# Patient Record
Sex: Female | Born: 1990
Health system: Southern US, Community
[De-identification: ages and names within clinical notes are randomized; demographics above are authoritative.]

## PROBLEM LIST (undated history)

## (undated) DIAGNOSIS — R112 Nausea with vomiting, unspecified: Secondary | ICD-10-CM

## (undated) DIAGNOSIS — R519 Headache, unspecified: Secondary | ICD-10-CM

## (undated) DIAGNOSIS — K219 Gastro-esophageal reflux disease without esophagitis: Secondary | ICD-10-CM

## (undated) DIAGNOSIS — R011 Cardiac murmur, unspecified: Secondary | ICD-10-CM

## (undated) DIAGNOSIS — E785 Hyperlipidemia, unspecified: Secondary | ICD-10-CM

## (undated) DIAGNOSIS — Z9889 Other specified postprocedural states: Secondary | ICD-10-CM

## (undated) HISTORY — DX: Gastro-esophageal reflux disease without esophagitis: K21.9

## (undated) HISTORY — PX: REDUCTION MAMMAPLASTY: SUR839

## (undated) HISTORY — DX: Cardiac murmur, unspecified: R01.1

---
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
## 2012-06-11 ENCOUNTER — Ambulatory Visit: Payer: Self-pay | Admitting: General Surgery

## 2012-06-11 LAB — COMPREHENSIVE METABOLIC PANEL
Albumin: 3.6 g/dL (ref 3.4–5.0)
Alkaline Phosphatase: 86 U/L (ref 50–136)
BUN: 9 mg/dL (ref 7–18)
Calcium, Total: 8.7 mg/dL (ref 8.5–10.1)
Co2: 23 mmol/L (ref 21–32)
EGFR (African American): >60
EGFR (Non-African Amer.): >60
Glucose: 84 mg/dL (ref 65–99)
SGPT (ALT): 27 U/L (ref 12–78)
Sodium: 139 mmol/L (ref 136–145)

## 2012-06-11 LAB — CBC WITH DIFFERENTIAL/PLATELET
Basophil #: 0 10*3/uL (ref 0.0–0.1)
Basophil %: 0.4 %
Eosinophil #: 0.2 10*3/uL (ref 0.0–0.7)
Eosinophil %: 2.1 %
HGB: 12.4 g/dL (ref 12.0–16.0)
Lymphocyte %: 21.3 %
MCH: 32 pg (ref 26.0–34.0)
MCHC: 34.5 g/dL (ref 32.0–36.0)
Monocyte #: 0.7 x10 3/mm (ref 0.2–0.9)
Neutrophil %: 69.9 %
Platelet: 299 10*3/uL (ref 150–440)
RBC: 3.87 10*6/uL (ref 3.80–5.20)
RDW: 12.3 % (ref 11.5–14.5)

## 2012-07-31 ENCOUNTER — Ambulatory Visit: Payer: Self-pay | Admitting: General Surgery

## 2012-12-01 ENCOUNTER — Emergency Department: Payer: Self-pay | Admitting: Emergency Medicine

## 2012-12-01 LAB — COMPREHENSIVE METABOLIC PANEL
Anion Gap: 4 — ABNORMAL LOW (ref 7–16)
BUN: 6 mg/dL — ABNORMAL LOW (ref 7–18)
Bilirubin,Total: 0.5 mg/dL (ref 0.2–1.0)
Chloride: 108 mmol/L — ABNORMAL HIGH (ref 98–107)
Creatinine: 0.71 mg/dL (ref 0.60–1.30)
EGFR (African American): >60
EGFR (Non-African Amer.): >60
Glucose: 98 mg/dL (ref 65–99)
SGOT(AST): 25 U/L (ref 15–37)
Sodium: 137 mmol/L (ref 136–145)

## 2012-12-01 LAB — URINALYSIS, COMPLETE
Glucose,UR: NEGATIVE mg/dL (ref 0–75)
Ketone: NEGATIVE
Ph: 5 (ref 4.5–8.0)
RBC,UR: 3 /HPF (ref 0–5)
Squamous Epithelial: 7
WBC UR: 2 /HPF (ref 0–5)

## 2012-12-01 LAB — CBC
HCT: 37.4 % (ref 35.0–47.0)
HGB: 13 g/dL (ref 12.0–16.0)
MCH: 33 pg (ref 26.0–34.0)
MCHC: 34.8 g/dL (ref 32.0–36.0)
MCV: 95 fL (ref 80–100)
Platelet: 276 10*3/uL (ref 150–440)
RBC: 3.94 10*6/uL (ref 3.80–5.20)
RDW: 12.7 % (ref 11.5–14.5)
WBC: 11.8 10*3/uL — ABNORMAL HIGH (ref 3.6–11.0)

## 2013-08-06 ENCOUNTER — Observation Stay: Payer: Self-pay

## 2013-08-06 LAB — PIH PROFILE
Anion Gap: 6 — ABNORMAL LOW (ref 7–16)
BUN: 5 mg/dL — ABNORMAL LOW (ref 7–18)
Creatinine: 0.46 mg/dL — ABNORMAL LOW (ref 0.60–1.30)
EGFR (African American): >60
EGFR (Non-African Amer.): >60
Glucose: 65 mg/dL (ref 65–99)
HCT: 29.6 % — ABNORMAL LOW (ref 35.0–47.0)
HGB: 10.6 g/dL — ABNORMAL LOW (ref 12.0–16.0)
MCH: 34.1 pg — ABNORMAL HIGH (ref 26.0–34.0)
MCV: 95 fL (ref 80–100)
Platelet: 179 10*3/uL (ref 150–440)
Potassium: 3.8 mmol/L (ref 3.5–5.1)
RBC: 3.1 10*6/uL — ABNORMAL LOW (ref 3.80–5.20)
Sodium: 137 mmol/L (ref 136–145)
Uric Acid: 2.4 mg/dL — ABNORMAL LOW (ref 2.6–6.0)

## 2013-08-06 LAB — PROTEIN / CREATININE RATIO, URINE
Creatinine, Urine: 115.4 mg/dL (ref 30.0–125.0)
Protein, Random Urine: 26 mg/dL — ABNORMAL HIGH (ref 0–12)
Protein/Creat. Ratio: 225 mg/gCREAT — ABNORMAL HIGH (ref 0–200)

## 2013-08-17 ENCOUNTER — Observation Stay: Payer: Self-pay

## 2013-08-18 ENCOUNTER — Inpatient Hospital Stay: Payer: Self-pay | Admitting: Obstetrics and Gynecology

## 2013-08-18 LAB — PIH PROFILE
Co2: 21 mmol/L (ref 21–32)
Creatinine: 0.63 mg/dL (ref 0.60–1.30)
Glucose: 94 mg/dL (ref 65–99)
MCH: 33.9 pg (ref 26.0–34.0)
MCV: 96 fL (ref 80–100)
Osmolality: 267 (ref 275–301)
Potassium: 3.7 mmol/L (ref 3.5–5.1)
RBC: 3.41 10*6/uL — ABNORMAL LOW (ref 3.80–5.20)
RDW: 13.2 % (ref 11.5–14.5)
Sodium: 135 mmol/L — ABNORMAL LOW (ref 136–145)
Uric Acid: 3 mg/dL (ref 2.6–6.0)

## 2013-08-18 LAB — PROTEIN / CREATININE RATIO, URINE: Creatinine, Urine: 78.4 mg/dL (ref 30.0–125.0)

## 2013-08-18 LAB — GC/CHLAMYDIA PROBE AMP

## 2013-08-20 LAB — HEMATOCRIT: HCT: 23.9 % — ABNORMAL LOW (ref 35.0–47.0)

## 2013-08-20 LAB — TSH: Thyroid Stimulating Horm: 4.58 u[IU]/mL — ABNORMAL HIGH

## 2013-08-21 LAB — CBC WITH DIFFERENTIAL/PLATELET
Basophil #: 0 10*3/uL (ref 0.0–0.1)
Eosinophil #: 0.2 10*3/uL (ref 0.0–0.7)
Eosinophil %: 2 %
Lymphocyte %: 13.5 %
MCH: 33.9 pg (ref 26.0–34.0)
MCHC: 35.3 g/dL (ref 32.0–36.0)
MCV: 96 fL (ref 80–100)
Monocyte %: 6.3 %
Platelet: 171 10*3/uL (ref 150–440)
RBC: 2.46 10*6/uL — ABNORMAL LOW (ref 3.80–5.20)
RDW: 13.2 % (ref 11.5–14.5)

## 2014-01-07 LAB — HM PAP SMEAR: HM Pap smear: NEGATIVE

## 2015-01-21 NOTE — Op Note (Signed)
PATIENT NAME:  Brooke Fox, LEA MR#:  [id number] DATE OF BIRTH:  [birth year]  DATE OF PROCEDURE:  08/19/2013  PREOPERATIVE DIAGNOSES:  1. A 38.2 week intrauterine pregnancy, undelivered.  2. Pregnancy-induced hypertension.  3. Arrest of dilation/descent.   POSTOPERATIVE DIAGNOSES:  1. A 38.2 week intrauterine pregnancy, undelivered.  2. Pregnancy-induced hypertension.  3. Arrest of dilation/descent.  4. Viable female infant 6 pounds 6 ounces.   OPERATIVE PROCEDURE: Primary low cervical transverse cesarean section.   SURGEON: Alanda Slim. Skyylar Kopf, M.D.   FIRST ASSISTANT: Gennie Alma, Certified Nurse Midwife.   SECOND ASSISTANT: Providence Lanius, PA-S.   ANESTHESIA: Spinal.   INDICATIONS: The patient is a 24 year old white female gravida 1, para 0, at 38.2 weeks' gestation who was admitted for induction of labor secondary to [facility]. She had oligohydramnios as well. The patient underwent Pitocin induction of labor and got to complete, complete, 0 station, ROA position. There was significant molding present and clinical pelvimetry demonstrated a marginal pelvis. Because of a nonreassuring fetal heart rate tracing, the patient was counseled to undergo cesarean section delivery.   FINDINGS AT SURGERY: Revealed a viable female infant 6 pounds 6 ounces, having Apgars of 9 and 9 at 1 and 5 minutes, respectively. Uterus, tubes and ovaries were grossly normal. There was significant molding of the fetal head noted.   DESCRIPTION OF PROCEDURE: The patient was brought to the operating room where she was placed in a sitting position. Spinal anesthetic was introduced without difficulty. She was placed in the supine position with a right lateral hip roll in place. A ChloraPrep abdominal and perineal prep and drape was performed in standard fashion. A Foley catheter had been placed which was draining clear yellow urine from the bladder. After checking for adequate level of anesthesia, a Pfannenstiel incision was made in  the abdomen. Fascia was incised transversely and extended bilaterally with Mayo scissors. The midline raphe was incised, separated and the peritoneum was entered. Bladder flap was created through sharp dissection. Low transverse incision was made in the uterus, and this was extended cephalad and caudad bluntly. The infant was delivered through a vertex presentation, and moderate molding was encountered. The infant was vigorous at birth. The umbilical cord was doubly clamped, cut and the infant was handed off to the awaiting resuscitation team. Cord blood sampling was obtained. Placenta was expressed from the uterine cavity. The uterus was externalized onto the anterior abdominal wall. The uterus was cleared of all debris with laps. The incision was closed in 2 layers using #1 chromic suture. The first layer was a running locked stitch. The second layer was an imbricating layer. The uterus, tubes and ovaries were grossly normal on inspection. Once satisfied with hemostasis, the uterus was placed back into the abdominopelvic cavity. Gutters were cleared of all debris with laps. The incision was closed in layers with 0 Maxon being used on the fascia in a simple running manner. The skin was closed with staples. Pressure dressing was applied. The patient was then mobilized and taken to the recovery room in satisfactory condition.   ESTIMATED BLOOD LOSS: 500 mL.   IV FLUIDS: 800 mL.   URINE OUTPUT: 150 mL.   All instruments, needle and sponge counts were verified as correct. The patient did receive Ancef antibiotic prophylaxis.    ____________________________ Alanda Slim Reshanda Lewey, MD mad:gb D: 08/21/2013 18:31:57 ET T: 08/21/2013 22:44:46 ET JOB#: [id number]  cc: Hassell Done A. Darrielle Pflieger, MD, <Dictator> [facility] Alanda Slim Romey Cohea MD ELECTRONICALLY SIGNED 09/03/2013 7:39

## 2015-02-08 NOTE — H&P (Signed)
L&D Evaluation:  History:  HPI 24yo MWF sent over from office for IOL at [redacted]w[redacted]d with mild PIH and oligohydramnios (AFI 4.1cm), G1P000, normal pregnancy until 36 weeks at which time she presented with sudden onset edema and elevated BP, has been closely follwed 2x/week until today.   Presents with other   Patient's Medical History Thyroid Disease   Patient's Surgical History none   Medications Pre Natal Vitamins  Iron  Tylenol (Acetaminophen)   Allergies NKDA   Social History none   Family History Non-Contributory   ROS:  ROS All systems were reviewed.  HEENT, CNS, GI, GU, Respiratory, CV, Renal and Musculoskeletal systems were found to be normal.   Exam:  Vital Signs stable   Urine Protein trace   General no apparent distress   Mental Status clear   Chest clear   Heart normal sinus rhythm   Abdomen gravid, non-tender   Estimated Fetal Weight Average for gestational age, EFW on u/s today 6#12oz   Fetal Position vtx   Back no CVAT   Edema 1+   Reflexes 1+   Clonus negative   Pelvic 2cm on last exam 08/16/13   Mebranes Intact   FHT normal rate with no decels   Ucx irregular   Skin dry   Impression:  Impression Pitocin IOL for oligoydramnios and mild PIH   Plan:  Plan monitor BP, antibiotics for GBBS prophylaxis, Pitocin per prtocol   Electronic Signatures: Trudee Kuster, Kristelle Cavallaro N (CNM)  (Signed 18-Nov-14 13:24)  Authored: L&D Evaluation   Last Updated: 18-Nov-14 13:24 by Evonnie Pat (CNM)

## 2015-02-08 NOTE — H&P (Signed)
L&D Evaluation:  History:  HPI 24yo MWF sent from office for Clarence workup and serial BP checks at [place], [place]. Presented today with sudden onset of facial , hand, and pedal edema, elevated BP, and c/o HA x2d with intermittent blurred vision.   Presents with other   Patient's Medical History No Chronic Illness   Patient's Surgical History none   Medications Pre Natal Vitamins  Tylenol (Acetaminophen)   Social History none   Family History Non-Contributory   ROS:  ROS All systems were reviewed.  HEENT, CNS, GI, GU, Respiratory, CV, Renal and Musculoskeletal systems were found to be normal.   Exam:  Vital Signs stable   Urine Protein trace   General no apparent distress   Mental Status clear   Chest clear   Heart normal sinus rhythm   Abdomen gravid, non-tender   Estimated Fetal Weight Average for gestational age   Fetal Position vtx   Back no CVAT   Edema 2+   Reflexes 1+   Clonus negative   Pelvic 1/50/-1   Mebranes Intact   FHT normal rate with no decels   Ucx absent   Impression:  Impression evaluation for PIH   Plan:  Plan discharge, OOW til return appointment on 08/11/13, modified bedrest, PIH precautions discussed.   Follow Up Appointment already scheduled   Electronic Signatures: Evonnie Pat (CNM)  (Signed [phone] 12:34)  Authored: L&D Evaluation   Last Updated: 06-Nov-14 12:34 by Evonnie Pat (CNM)

## 2015-02-11 ENCOUNTER — Other Ambulatory Visit: Payer: Self-pay | Admitting: Obstetrics and Gynecology

## 2015-02-22 ENCOUNTER — Other Ambulatory Visit: Payer: Self-pay | Admitting: Obstetrics and Gynecology

## 2015-07-27 ENCOUNTER — Other Ambulatory Visit: Payer: Self-pay

## 2015-07-27 ENCOUNTER — Other Ambulatory Visit: Payer: Self-pay | Admitting: Obstetrics and Gynecology

## 2015-07-30 LAB — VITAMIN D 25 HYDROXY (VIT D DEFICIENCY, FRACTURES): VIT D 25 HYDROXY: 49.5 ng/mL (ref 30.0–100.0)

## 2015-08-10 ENCOUNTER — Telehealth: Payer: Self-pay | Admitting: *Deleted

## 2015-08-10 NOTE — Telephone Encounter (Signed)
-----   Message from Evonnie Pat, [place] sent at 08/09/2015  2:51 AM EST ----- Please let her know her vit D is now normal, I would recommend weekly supplemnts through the winter.

## 2015-08-10 NOTE — Telephone Encounter (Signed)
Notified pt of lab results 

## 2015-10-19 ENCOUNTER — Other Ambulatory Visit: Payer: Self-pay | Admitting: Neurosurgery

## 2015-10-19 DIAGNOSIS — M545 Low back pain: Secondary | ICD-10-CM

## 2015-10-31 ENCOUNTER — Ambulatory Visit (HOSPITAL_COMMUNITY)
Admission: RE | Admit: 2015-10-31 | Discharge: 2015-10-31 | Disposition: A | Discharge status: Home / Self Care | Payer: PRIVATE HEALTH INSURANCE | Source: Ambulatory Visit | Attending: Neurosurgery | Admitting: Neurosurgery

## 2015-10-31 DIAGNOSIS — M5134 Other intervertebral disc degeneration, thoracic region: Secondary | ICD-10-CM | POA: Diagnosis not present

## 2015-10-31 DIAGNOSIS — M545 Low back pain: Secondary | ICD-10-CM | POA: Diagnosis not present

## 2015-11-07 ENCOUNTER — Ambulatory Visit: Payer: PRIVATE HEALTH INSURANCE | Attending: Neurosurgery | Admitting: Physical Therapy

## 2015-11-07 ENCOUNTER — Encounter: Payer: Self-pay | Admitting: Physical Therapy

## 2015-11-07 DIAGNOSIS — M5442 Lumbago with sciatica, left side: Secondary | ICD-10-CM | POA: Insufficient documentation

## 2015-11-07 DIAGNOSIS — M5441 Lumbago with sciatica, right side: Secondary | ICD-10-CM | POA: Diagnosis not present

## 2015-11-07 DIAGNOSIS — M62830 Muscle spasm of back: Secondary | ICD-10-CM | POA: Diagnosis present

## 2015-11-07 NOTE — Therapy (Signed)
[facility] [address] [place], [address] Phone: [phone]   Fax:  [phone]  Physical Therapy Evaluation  Patient Details  Name: Brooke Fox MRN: [id number] Date of Birth: 27-Apr-1991 Referring Provider: Jovita Gamma MD  Encounter Date: 11/07/2015      PT End of Session - 11/07/15 1040    Visit Number 1   Number of Visits 8   Date for PT Re-Evaluation 12/05/15   Authorization Type 1   Authorization Time Period 8 (WC)   PT Start Time 0935   PT Stop Time 1040   PT Time Calculation (min) 65 min   Activity Tolerance Patient tolerated treatment well   Behavior During Therapy [facility] for tasks assessed/performed      History reviewed. No pertinent past medical history.  Past Surgical History  Procedure Laterality Date  . Cesarean section      2014    There were no vitals filed for this visit.  Visit Diagnosis:  Bilateral low back pain with sciatica, sciatica laterality unspecified - Plan: PT plan of care cert/re-cert  Spasm of muscle, back - Plan: PT plan of care cert/re-cert      Subjective Assessment - 11/07/15 0955    Subjective Currently reporting sorness in low back bilaterally.     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 Pain is resolving at this time and continue with intermittent symptoms in LE's. Pt reports she has been treated conservatively and is now referred to physical therapy.     Limitations Sitting;Lifting;House hold activities;Standing;Other (comment)  Pushing and pulling   Diagnostic tests MRI lumbar spine   Patient Stated Goals patient would like to be able to bend, walk and work without back pain and return to prior level of function   Currently in Pain? Yes   Pain Score 2   pain ranges 2-8/10   Pain Location Back   Pain Orientation Lower   Pain Descriptors / Indicators Sore   Pain Type Acute pain  07/30/15   Pain Onset More than a month ago   Pain Frequency Constant   Aggravating Factors  bending, lifting, pushing, pulling   Pain Relieving Factors rest   Effect of Pain on Daily Activities limits abiltiy to perform household activties and work related activties without difficulty            [facility] PT Assessment - 11/07/15 0945    Assessment   Medical Diagnosis M54.20 Lumbago of lumbar region with sciatica   Referring Provider Jovita Gamma MD   Onset Date/Surgical Date 07/30/15   Hand Dominance Right   Next MD Visit Unknown   Prior Therapy None   Precautions   Precautions None   Restrictions   Weight Bearing Restrictions No   Balance Screen   Has the patient fallen in the past 6 months No   Has the patient had a decrease in activity level because of a fear of falling?  No   Is the patient reluctant to leave their home because of a fear of falling?  No  N   Home Ecologist residence   Home Access Stairs to enter   Entrance Stairs-Number of Steps 7   Entrance Stairs-Rails Can reach both   [place] One level   Prior Function   Level of Independence Independent   Vocation Full time employment   Runner, broadcasting/film/video: Wearing lead  apron (8lbs), lifting, pushing, pulling   Leisure Taking care of 25 year old, going to gym to exercise (cardio),   Cognition   Overall Cognitive Status Within Functional Limits for tasks assessed      Objective: AROM lumbar spine: flexion WNL's with lower back pain no worse with repetition, extension: limited 25% with increased lower back pain, no worse with repetition, lateral flexion bilaterally WFL without increased pain Strength: both LE's WFL's major muscle groups Palpation: + tenderness elicited lumbar spine lower levels  Special tests: Slump (+) reproduction of right LE symptoms (-) for left LE, SLR no reproduction of symptoms, crossed SLR (-) for reproduction of symptoms Posture: increased  lumbar lordosis Flexibility:  SLR bilaterally to 90 degrees   Sensation: grossly intact to light touch both LE's       PT Education - 11/07/15 1040    Education provided Yes   Education Details posture correction for sitting with lumbar roll, proper lifting technique using golfer's bend, prone over one pillow and prop on elbows for positioning to decrease pain   Person(s) Educated Patient   Methods Explanation;Demonstration;Verbal cues   Comprehension Verbalized understanding;Returned demonstration;Verbal cues required             PT Long Term Goals - 11/07/15 1040    PT LONG TERM GOAL #1   Title Patient will demonstrate improved function with daily tasks at home/work as indicated by modified Oswestry score of <10% by 12/05/2015   Baseline modified oswestry 18%   Status New   PT LONG TERM GOAL #2   Title Patient will report maximum pain level in back 3-4/10 with aggravating activties such as pulling/pushing by 12/05/2015    Baseline Pain level ranges from 2/10 up to 8/10   Status New   PT LONG TERM GOAL #3   Title Patient will be independent with home program for pain control and exercise in order to transition to self managment for return to prior level of function by 12/05/2015   Baseline limited knowledge of appropriate exercises and pain control strategies    Status New               Plan - 11/07/15 1040    Clinical Impression Statement Patient is a 25 year old right hand dominant female who presents with low back pain that is resolving since initial injury. She has limitations with work related activties involving lifting, pushing and pulling. She has limited ROM in lumbar spine with pain with extension. She has limited knowledge of appropriate pain control strategies and progression of exercises to improve function and return to prior level of function.    Pt will benefit from skilled therapeutic intervention in order to improve on the following deficits Decreased  strength;Pain;Increased muscle spasms;Decreased range of motion;Improper body mechanics;Impaired flexibility   Rehab Potential Good   Clinical Impairments Affecting Rehab Potential (+) age, motivated, acute condition   PT Frequency 2x / week   PT Duration 4 weeks   PT Treatment/Interventions Therapeutic exercise;Moist Heat;Electrical Stimulation;Cryotherapy;Ultrasound;Patient/family education;Manual techniques;Dry needling   PT Next Visit Plan pain control, posture instruciton, progressive exercise   PT Home Exercise Plan posture correction with sitting, positioning to decrease paiin, proper lifting technique (golfer's bend)   Consulted and Agree with Plan of Care Patient         Problem List There are no active problems to display for this patient.   Jomarie Longs PT 11/07/2015, 10:34 PM  [facility] [facility]  [address], [place], [address] Phone: [phone]   Fax:  [phone]  Name: Brooke Fox MRN: [id number] Date of Birth: 10-28-1990

## 2015-11-09 ENCOUNTER — Ambulatory Visit: Payer: PRIVATE HEALTH INSURANCE | Admitting: Physical Therapy

## 2015-11-09 DIAGNOSIS — M62830 Muscle spasm of back: Secondary | ICD-10-CM

## 2015-11-09 DIAGNOSIS — M5441 Lumbago with sciatica, right side: Secondary | ICD-10-CM

## 2015-11-09 DIAGNOSIS — M5442 Lumbago with sciatica, left side: Principal | ICD-10-CM

## 2015-11-10 NOTE — Therapy (Signed)
[place] [facility] [address], [place], [address] Phone: [phone]   Fax:  [phone]  Physical Therapy Treatment  Patient Details  Name: Brooke Fox MRN: [id number] Date of Birth: [birth year] Referring Provider: Jovita Gamma MD  Encounter Date: 11/09/2015      PT End of Session - 11/09/15 1030    Visit Number 2   Number of Visits 8   Date for PT Re-Evaluation 12/05/15   Authorization Type 2   Authorization Time Period 8 (WC)   PT Start Time 0950   PT Stop Time 1030   PT Time Calculation (min) 40 min   Activity Tolerance Patient tolerated treatment well   Behavior During Therapy [facility] for tasks assessed/performed      No past medical history on file.  Past Surgical History  Procedure Laterality Date  . Cesarean section      2014    There were no vitals filed for this visit.  Visit Diagnosis:  Bilateral low back pain with sciatica, sciatica laterality unspecified  Spasm of muscle, back      Subjective Assessment - 11/09/15 0951    Subjective Patient reports she is feeling better today with less back pain and no problems following previous session. She has not been working since the weekend.    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 Pt reports she has been treating conservatively.    Limitations Sitting;Lifting;House hold activities;Standing;Other (comment)  ;ushing/pulling   Patient Stated Goals patient would like to be able to bend, walk and work without back pain and return to prior level of function   Currently in Pain? Yes   Pain Score 2    Pain Location Back   Pain Orientation Right;Lower   Pain Descriptors / Indicators Sore   Pain Type Acute pain;Other (Comment)  07/30/2015   Pain Onset More than a month ago   Pain Frequency Constant       Objective: AROM; lumbar spine on arrival: flexion WNL's with  repetition mild back stiffness/soreness, extension: limited 25% with discomfort Palpation: + moderate sized spasms with tenderness elicited along [id number] right side          [facility] Adult PT Treatment/Exercise - 11/09/15 0952    Exercises   Exercises Other Exercises patient performed exercises with guidance, verbal and tactile cues and demonstration of PT: Prone lying upper back extension with arms at sides x 10, hip extension with elongating LE/spine for core control 2 x 5 reps each LE, side lying clam with tactile/verbal cues for correct positioning to engage correct muscles, supine bridging with ball between knees x 10 reps through partial ROM with controlled motion and TrA contraction     Manual therapy: superficial and deep techniques: STM with MFR techniques to right side paraspinal muscles/spasms to improve elasticity, decrease pain and improve ability to move with less difficulty; patient prone over one pillow   Patient response to treatment; increased warmth over area treated for STM with decreased spasms by >50% and decreaseds pain level to 0-1/10, patient able to perform exercises with good technique following instruction with verbal cuing           PT Education - 11/09/15 1020    Education provided Yes   Education Details HEP; supine bridge, prone upper back extension, hip extension, side lying clam   Person(s) Educated Patient   Methods Explanation;Demonstration;Verbal cues;Tactile cues;handout  given   Comprehension Verbalized understanding;Returned demonstration;Verbal cues required;Tactile cues required             PT Long Term Goals - 11/07/15 1040    PT LONG TERM GOAL #1   Title Patient will demonstrate improved function with daily tasks at home/work as indicated by modified Oswestry score of <10% by 12/05/2015   Baseline modified oswestry 18%   Status New   PT LONG TERM GOAL #2   Title Patient will report maximum pain level in back 3-4/10 with aggravating  activties such as pulling/pushing by 12/05/2015    Baseline Pain level ranges from 2/10 up to 8/10   Status New   PT LONG TERM GOAL #3   Title Patient will be independent with home program for pain control and exercise in order to transition to self managment for return to prior level of function by 12/05/2015   Baseline limited knowledge of appropriate exercises and pain control strategies    Status New               Plan - 11/09/15 1040    Clinical Impression Statement Patient demonstrated decreased pain to mild/none at end of session and was able to progress stabilizaiton/core control exercises with verbal and tactile cuing. She continues with pain/weakness and spasms in back that need to be addressed in order for her to return to prior level of function without limitaitons.    Pt will benefit from skilled therapeutic intervention in order to improve on the following deficits Decreased strength;Pain;Increased muscle spasms;Decreased range of motion;Improper body mechanics;Impaired flexibility   Rehab Potential Good   PT Frequency 2x / week   PT Duration 4 weeks   PT Treatment/Interventions Therapeutic exercise;Moist Heat;Electrical Stimulation;Cryotherapy;Ultrasound;Patient/family education;Manual techniques;Dry needling   PT Next Visit Plan pain control, posture instruciton, progressive exercise   PT Home Exercise Plan back stabilization, bridging, clam         Problem List There are no active problems to display for this patient.   Jomarie Longs PT 11/10/2015, 8:40 AM  [place] [facility] [address] [place], [address] Phone: [phone]   Fax:  [phone]  Name: Brooke Fox MRN: [id number] Date of Birth: 1991-03-11

## 2015-11-14 ENCOUNTER — Encounter: Payer: Self-pay | Admitting: Physical Therapy

## 2015-11-14 ENCOUNTER — Ambulatory Visit: Payer: PRIVATE HEALTH INSURANCE | Admitting: Physical Therapy

## 2015-11-14 DIAGNOSIS — M5442 Lumbago with sciatica, left side: Principal | ICD-10-CM

## 2015-11-14 DIAGNOSIS — M5441 Lumbago with sciatica, right side: Secondary | ICD-10-CM | POA: Diagnosis not present

## 2015-11-14 DIAGNOSIS — M62830 Muscle spasm of back: Secondary | ICD-10-CM

## 2015-11-14 NOTE — Therapy (Signed)
[place] [facility] [address], [place], [address] Phone: [phone]   Fax:  [phone]  Physical Therapy Treatment  Patient Details  Name: Brooke Fox MRN: [id number] Date of Birth: [birth year] Referring Provider: Jovita Gamma MD  Encounter Date: 11/14/2015      PT End of Session - 11/14/15 1202    Visit Number 3   Number of Visits 6   Date for PT Re-Evaluation 12/05/15   Authorization Type 3   Authorization Time Period 8 (WC)   PT Start Time 0945   PT Stop Time 1029   PT Time Calculation (min) 44 min   Activity Tolerance Patient tolerated treatment well   Behavior During Therapy [facility] for tasks assessed/performed      History reviewed. No pertinent past medical history.  Past Surgical History  Procedure Laterality Date  . Cesarean section      2014    There were no vitals filed for this visit.  Visit Diagnosis:  Bilateral low back pain with sciatica, sciatica laterality unspecified  Spasm of muscle, back      Subjective Assessment - 11/14/15 0945    Subjective Patient reports feeling better today after working versus the previous weekend.  Feels more on stiff on the left side today.    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 Pt reports she has been treating conservatively.    Limitations Sitting;Lifting;House hold activities;Standing;Other (comment)   Patient Stated Goals patient would like to be able to bend, walk and work without back pain and return to prior level of function   Currently in Pain? Yes   Pain Score 2    Pain Location Back   Pain Orientation Left   Pain Descriptors / Indicators Sore   Pain Type Acute pain   Pain Onset More than a month ago   Pain Frequency Constant      Objective: AROM; lumbar spine on arrival: flexion WNL with pain at end range, extension: limited 25% with discomfort Palpation:  + moderate sized spasms with tenderness elicited along [id number], L1-5 right side       [facility] Adult PT Treatment/Exercise - 11/14/15   Exercises   Exercises Other Exercises patient performed exercises with guidance, verbal and tactile cues and demonstration of PT: Prone lying upper back extension with arms at sides x 15, hip extension with elongating LE/spine for core control x10, x5 reps each LE-- manual resistance applied at top of leg height, knees to chest with yellow ball (45cm) x 2 min, side lying clam with tactile/verbal cues for correct positioning to engage correct muscles x 10 reps each, supine bridging with ball between knees 2 x 10 reps through partial ROM with controlled motion and TrA contraction, TrA contraction with bilateral arms overhead 2 x 10, Multifidus Crunch --  performed through partial ROM x 10            Manual therapy: superficial and deep techniques: STM to right side paraspinal muscles/spasms to improve elasticity, decreased pain and ability to move with less difficulty; patient prone over one pillow   Patient response to treatment: increased warmth over area treated for STM with decreased spasms. Patient required increase tactile and verbal cueing to perform multifidus crunch and unable to perform throughout full ROM without onset of low back symptoms (therefore modified exercise with short arc of motion) indicating decreased motor control during weight  bearing.          PT Education - 11/14/15 1201    Education provided Yes   Education Details HEP: Multifidus crunch   Person(s) Educated Patient   Methods Explanation;Demonstration;Verbal cues;Tactile cues   Comprehension Verbalized understanding;Returned demonstration;Verbal cues required;Tactile cues required             PT Long Term Goals - 11/07/15 1040    PT LONG TERM GOAL #1   Title Patient will demonstrate improved function with daily tasks at home/work as indicated by modified Oswestry  score of <10% by 12/05/2015   Baseline modified oswestry 18%   Status New   PT LONG TERM GOAL #2   Title Patient will report maximum pain level in back 3-4/10 with aggravating activties such as pulling/pushing by 12/05/2015    Baseline Pain level ranges from 2/10 up to 8/10   Status New   PT LONG TERM GOAL #3   Title Patient will be independent with home program for pain control and exercise in order to transition to self managment for return to prior level of function by 12/05/2015   Baseline limited knowledge of appropriate exercises and pain control strategies    Status New               Plan - 11/14/15 1203    Clinical Impression Statement Patient with improved lower back pain today indicating functional carryover between visits. Improved muscle activation when performing core stabilization exercise and continues to require minimal tactile and verbal cueing. Patient tolerated progression of exercise without increased symptoms  and will benefit from further skilled therapy aimed at improving motor control and decreasing muscle spasms to return to prior level of funciton.    Pt will benefit from skilled therapeutic intervention in order to improve on the following deficits Decreased strength;Pain;Increased muscle spasms;Decreased range of motion;Improper body mechanics;Impaired flexibility   Rehab Potential Good   Clinical Impairments Affecting Rehab Potential (+) age, motivated, acute condition   PT Frequency 2x / week   PT Duration 4 weeks   PT Treatment/Interventions Therapeutic exercise;Moist Heat;Electrical Stimulation;Cryotherapy;Ultrasound;Patient/family education;Manual techniques;Dry needling   PT Next Visit Plan pain control, posture instruciton, progressive exercise   PT Home Exercise Plan back stabilization, bridging, clam    Consulted and Agree with Plan of Care Patient        Problem List There are no active problems to display for this patient.   Blythe Stanford,  SPT 11/15/2015, 11:49 AM  [place] [facility] [address], [place], [address] Phone: [phone]   Fax:  [phone]  Name: Brooke Fox MRN: [id number] Date of Birth: September 03, 1991

## 2015-11-17 ENCOUNTER — Ambulatory Visit: Payer: PRIVATE HEALTH INSURANCE | Admitting: Physical Therapy

## 2015-11-17 ENCOUNTER — Encounter: Payer: Self-pay | Admitting: Physical Therapy

## 2015-11-17 DIAGNOSIS — M5441 Lumbago with sciatica, right side: Secondary | ICD-10-CM | POA: Diagnosis not present

## 2015-11-17 DIAGNOSIS — M5442 Lumbago with sciatica, left side: Principal | ICD-10-CM

## 2015-11-17 DIAGNOSIS — M62830 Muscle spasm of back: Secondary | ICD-10-CM

## 2015-11-17 NOTE — Therapy (Signed)
[place] [facility] [address] [place], [address] Phone: [phone]   Fax:  [phone]  Physical Therapy Treatment  Patient Details  Name: Brooke Fox MRN: [id number] Date of Birth: 01/22/1991 Referring Provider: Jovita Gamma MD  Encounter Date: 11/17/2015      PT End of Session - 11/17/15 1158    Visit Number 4   Number of Visits 8   Date for PT Re-Evaluation 12/05/15   Authorization Type 4   Authorization Time Period 8 (WC)   PT Start Time 1100   PT Stop Time 1148   PT Time Calculation (min) 48 min   Activity Tolerance Patient tolerated treatment well   Behavior During Therapy [facility] for tasks assessed/performed      History reviewed. No pertinent past medical history.  Past Surgical History  Procedure Laterality Date  . Cesarean section      2014    There were no vitals filed for this visit.  Visit Diagnosis:  Bilateral low back pain with sciatica, sciatica laterality unspecified  Spasm of muscle, back      Subjective Assessment - 11/17/15 1105    Subjective Patient reports feeling better than previous visit with no exacerbation of symptoms. Feels more stiff on the right versus left today.   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 Pt reports she has been treating conservatively.    Limitations Sitting;Lifting;House hold activities;Standing;Other (comment)   Patient Stated Goals patient would like to be able to bend, walk and work without back pain and return to prior level of function   Currently in Pain? Yes   Pain Score 1    Pain Location Back   Pain Orientation Right   Pain Descriptors / Indicators Sore   Pain Type Acute pain   Pain Onset More than a month ago   Pain Frequency Constant      Objective:  AROM; lumbar spine flexion: WNL's with mild soreness lower lumbar spine, extension WFL with mild soreness  end range  Palpation: decreased soft tissue mobility right lumbar paraspinal muscle with fascial restrictions noted    [facility] Adult PT Treatment/Exercise - 11/17/15   Exercises   Patient performed exercises with guidance, verbal and tactile cues and demonstration of PT: Prone press up -- x 3 Prone lying upper back extension with arms at sides --  x 15,  hip extension with elongating LE/spine for core control -- x15 reps each LE knees to chest with yellow ball (45cm) x 2 min, TrA contraction with bilateral arms overhead-- 2 x 15  Multifidus Crunch in sitting -- performed through partial ROM x 15 Side step-ups onto airex -- x15 bilaterally Rotational cable pulls -- x15 #10    Manual Therapy: Performed STM and myofascial techniques to left lumbar extensors with spasms noted of the L1-3 region with patient in the prone position with pillow under abdomen.     Patient Response to Treatment: Decreased muscle spasms by 50% and increased tolerance to passive accessory motions to L4-5 after STM to lumbar extensors.This increased tolerance indicates improved muscle guarding to the lumbar region. Patient demonstrated improved range of motion during multifidus crunch with improvement in coordination between anterior/posterior tilt of the pelvis.           PT Long Term Goals - 11/07/15 1040    PT LONG TERM GOAL #1   Title Patient will demonstrate  improved function with daily tasks at home/work as indicated by modified Oswestry score of <10% by 12/05/2015   Baseline modified oswestry 18%   Status New   PT LONG TERM GOAL #2   Title Patient will report maximum pain level in back 3-4/10 with aggravating activties such as pulling/pushing by 12/05/2015    Baseline Pain level ranges from 2/10 up to 8/10   Status New   PT LONG TERM GOAL #3   Title Patient will be independent with home program for pain control and exercise in order to transition to self managment for return to prior level of  function by 12/05/2015   Baseline limited knowledge of appropriate exercises and pain control strategies    Status New               Plan - 11/17/15 1159    Clinical Impression Statement Improvement in motor control and muscular endurance when performing core stabilization exercises today. Pt is progressing towards long term goals and required less verbal and tactile cueing to perform exercises in proper form indicating independence towards HEP. Pt will benefit from further skilled therapy aimed at increasing muscular endurance/strength and improving motor control to return to prior level of function.    Pt will benefit from skilled therapeutic intervention in order to improve on the following deficits Decreased strength;Pain;Increased muscle spasms;Decreased range of motion;Improper body mechanics;Impaired flexibility   Rehab Potential Good   Clinical Impairments Affecting Rehab Potential (+) age, motivated, acute condition   PT Frequency 2x / week   PT Duration 4 weeks   PT Treatment/Interventions Therapeutic exercise;Moist Heat;Electrical Stimulation;Cryotherapy;Ultrasound;Patient/family education;Manual techniques;Dry needling   PT Next Visit Plan pain control, posture instruciton, progressive exercise   PT Home Exercise Plan back stabilization, bridging, clam    Consulted and Agree with Plan of Care Patient        Problem List There are no active problems to display for this patient.   Blythe Stanford, SPT 11/17/2015, 12:12 PM  [place] [facility] [address], [place], [address] Phone: [phone]   Fax:  [phone]  Name: Brooke Fox MRN: [id number] Date of Birth: [birth year]

## 2015-11-21 ENCOUNTER — Encounter: Payer: Self-pay | Admitting: Physical Therapy

## 2015-11-21 ENCOUNTER — Ambulatory Visit: Payer: PRIVATE HEALTH INSURANCE | Admitting: Physical Therapy

## 2015-11-21 DIAGNOSIS — M5441 Lumbago with sciatica, right side: Secondary | ICD-10-CM

## 2015-11-21 DIAGNOSIS — M5442 Lumbago with sciatica, left side: Principal | ICD-10-CM

## 2015-11-21 DIAGNOSIS — M62830 Muscle spasm of back: Secondary | ICD-10-CM

## 2015-11-21 NOTE — Therapy (Signed)
[place] [facility] [address], [place], [address] Phone: [phone]   Fax:  [phone]  Physical Therapy Treatment  Patient Details  Name: Brooke Fox MRN: [id number] Date of Birth: 07-Dec-1990 Referring Provider: Jovita Gamma MD  Encounter Date: 11/21/2015      PT End of Session - 11/21/15 1054    Visit Number 5   Number of Visits 8   Date for PT Re-Evaluation 12/05/15   Authorization Type 5   Authorization Time Period 8 (WC)   PT Start Time [phone]   PT Stop Time 1033   PT Time Calculation (min) 46 min   Activity Tolerance Patient tolerated treatment well   Behavior During Therapy [facility] for tasks assessed/performed      History reviewed. No pertinent past medical history.  Past Surgical History  Procedure Laterality Date  . Cesarean section      2014    There were no vitals filed for this visit.  Visit Diagnosis:  Bilateral low back pain with sciatica, sciatica laterality unspecified  Spasm of muscle, back      Subjective Assessment - 11/21/15 1051    Subjective Patient reports increased low back pain secondary to increased activity at work over the weekend.    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 Pt reports she has been treating conservatively.    Limitations Sitting;Lifting;House hold activities;Standing;Other (comment)   Patient Stated Goals patient would like to be able to bend, walk and work without back pain and return to prior level of function   Currently in Pain? Yes   Pain Score 2    Pain Location Back   Pain Orientation Right   Pain Descriptors / Indicators Sore   Pain Type Acute pain   Pain Onset More than a month ago   Pain Frequency Constant        Objective:  AROM: lumbar spine flexion: WNL's with mild soreness lower lumbar spine, extension WFL with mild soreness end range  Palpation:  Decreased soft tissue mobility to right lumbar extensors/paraspinal muscles with fascial restrictions and spasms noted    [facility] Adult PT Treatment/Exercise - 11/21/15   Exercises   Patient performed exercises with guidance, verbal and tactile cues and demonstration of PT: Prone press up -- x 10 Prone lying upper back extension with arms at sides -- x 15,  hip extension with elongating LE/spine for core control -- x15 reps each LE knees to chest with yellow ball (45cm) x 2 min, Side to side straight leg lumbar rotation with yellow ball (45cm) x 2 min     Manual Therapy: Performed STM and myofascial techniques to bilateral lumbar extensors with spasms noted of the T10-L3 region with patient in the prone position with pillow under abdomen.  Modalities: Ice pack applied to lumbar region with patient in prone for 7 min. Skin assessed with no adverse reactions noted.     Patient Response to Treatment: Increased muscle spasms noted along left lumbar extensors which are decreased by 25% after STM. Decreased motor control with multifidus musculature during prone hip extension which is slightly improved after application of manual resistance. Decreased pain noted after performing pain-free lumbar rotation and flexion.        PT Education - 11/21/15 1054    Education provided Yes   Education Details HEP: Lower lumbar rotation   Person(s) Educated Patient  Methods Explanation;Demonstration   Comprehension Verbalized understanding;Returned demonstration             PT Long Term Goals - 11/07/15 1040    PT LONG TERM GOAL #1   Title Patient will demonstrate improved function with daily tasks at home/work as indicated by modified Oswestry score of <10% by 12/05/2015   Baseline modified oswestry 18%   Status New   PT LONG TERM GOAL #2   Title Patient will report maximum pain level in back 3-4/10 with aggravating activties such as pulling/pushing by 12/05/2015    Baseline Pain  level ranges from 2/10 up to 8/10   Status New   PT LONG TERM GOAL #3   Title Patient will be independent with home program for pain control and exercise in order to transition to self managment for return to prior level of function by 12/05/2015   Baseline limited knowledge of appropriate exercises and pain control strategies    Status New               Plan - 11/21/15 1057    Clinical Impression Statement Low back pain aggravated today secondary to increased workload over the weekend and required modification to treatment. Patient remains limitations in motor control when she performs multifidus activation exercises and will benefit from further skilled therapy aimed at increasing muscular endurance and improving motor control to return to prior level of function.     Pt will benefit from skilled therapeutic intervention in order to improve on the following deficits Decreased strength;Pain;Increased muscle spasms;Decreased range of motion;Improper body mechanics;Impaired flexibility   Rehab Potential Good   Clinical Impairments Affecting Rehab Potential (+) age, motivated, acute condition   PT Frequency 2x / week   PT Duration 4 weeks   PT Treatment/Interventions Therapeutic exercise;Moist Heat;Electrical Stimulation;Cryotherapy;Ultrasound;Patient/family education;Manual techniques;Dry needling   PT Next Visit Plan pain control, posture instruciton, progressive exercise   PT Home Exercise Plan back stabilization, bridging, clam, lumbar rotation   Consulted and Agree with Plan of Care Patient        Problem List There are no active problems to display for this patient.   Blythe Stanford, SPT 11/21/2015, 11:05 AM  [place] [facility] [address] [place], [address] Phone: [phone]   Fax:  [phone]  Name: Brooke Fox MRN: [id number] Date of Birth: 1991-02-02

## 2015-11-23 ENCOUNTER — Ambulatory Visit: Payer: PRIVATE HEALTH INSURANCE | Admitting: Physical Therapy

## 2015-11-23 ENCOUNTER — Encounter: Payer: Self-pay | Admitting: Physical Therapy

## 2015-11-23 DIAGNOSIS — M5441 Lumbago with sciatica, right side: Secondary | ICD-10-CM | POA: Diagnosis not present

## 2015-11-23 DIAGNOSIS — M5442 Lumbago with sciatica, left side: Principal | ICD-10-CM

## 2015-11-23 DIAGNOSIS — M62830 Muscle spasm of back: Secondary | ICD-10-CM

## 2015-11-23 NOTE — Therapy (Signed)
[place] [facility] [address], [place], [address] Phone: [phone]   Fax:  [phone]  Physical Therapy Treatment  Patient Details  Name: Brooke Fox MRN: [id number] Date of Birth: 1991-02-07 Referring Provider: Jovita Gamma MD  Encounter Date: 11/23/2015      PT End of Session - 11/23/15 1450    Visit Number 6   Number of Visits 8   Date for PT Re-Evaluation 12/05/15   Authorization Type 6   Authorization Time Period 8 (WC)   PT Start Time 0945   PT Stop Time 1030   PT Time Calculation (min) 45 min   Activity Tolerance Patient tolerated treatment well   Behavior During Therapy [facility] for tasks assessed/performed      History reviewed. No pertinent past medical history.  Past Surgical History  Procedure Laterality Date  . Cesarean section      2014    There were no vitals filed for this visit.  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 Pt reports she has been treating conservatively.    Limitations Sitting;Lifting;House hold activities;Standing;Other (comment)   Patient Stated Goals patient would like to be able to bend, walk and work without back pain and return to prior level of function   Currently in Pain? Yes   Pain Score 2    Pain Location Back   Pain Orientation Right   Pain Descriptors / Indicators Sore   Pain Type Acute pain   Pain Onset More than a month ago   Pain Frequency Constant   Aggravating Factors  bending, lifting, pushing, pulling   Pain Relieving Factors rest   Effect of Pain on Daily Activities limits ability to perform household  activities and work related activities without difficulty        Objective:  AROM: lumbar flexion: WNL's with mild soreness to lower lumbar spine at ER ; lumbar extension: WFL with mild soreness ER. Palpation: Decreased soft tissue mobility to right lumbar extensors/paraspinal muscles with fascial restrictions and spasms noted along T10-L5.    [facility] Adult PT Treatment/Exercise - 11/23/15   Exercises   Patient performed exercises with guidance, verbal and tactile cues and demonstration of PT: Prone on elbows -- 4 min Multifidus Crunch -- 2 x 15 Low Row in standing with grey band -- 2 x 15 Tall kneeling squat -- 2 x 15 Seated Lumbar extension against grey band -- 2 x 15    Manual Therapy: Performed STM and myofascial techniques to right lumbar extensors and paraspinal musculature. Spasms noted along paraspinal muscles of the T10-L3 region with patient in the prone position.  Modalities: Ice massage: 5 min to patient in prone along lumbar extensors and lower paraspinal musculature. Decrease in pain noted after treatment. No adverse effects noted after treatment.    Patient Response to Treatment: Decreased spasms by 33% after performing STM and myofascial techniques to lumbar extensors and paraspinal musculature. Increase in lumbar extension and decrease in pain after performing manual therapy and therapeutic exercise.         PT Long Term Goals - 11/07/15 1040  PT LONG TERM GOAL #1   Title Patient will demonstrate improved function with daily tasks at home/work as indicated by modified Oswestry score of <10% by 12/05/2015   Baseline modified oswestry 18%   Status New   PT LONG TERM GOAL #2   Title Patient will report maximum pain level in back 3-4/10 with aggravating activties such as pulling/pushing by 12/05/2015    Baseline Pain level ranges from 2/10 up to 8/10   Status New   PT LONG TERM GOAL #3   Title Patient will be independent with home program for  pain control and exercise in order to transition to self managment for return to prior level of function by 12/05/2015   Baseline limited knowledge of appropriate exercises and pain control strategies    Status New               Plan - 11/23/15 1505    Clinical Impression Statement Patient is progressing towards long term goals with ability to perform advancement of exercise without onset of symptoms. Focused on core stabilization today to increase muscle activation of the TrA and multifidus to better perform occupational tasks such as lifting. Patient will benefit from further skilled therapy aimed at increasing muscular endurance and motor control of the anterior and posterior core stabilizers to better return to prior level of function.    Pt will benefit from skilled therapeutic intervention in order to improve on the following deficits Decreased strength;Pain;Increased muscle spasms;Decreased range of motion;Improper body mechanics;Impaired flexibility   Rehab Potential Good   Clinical Impairments Affecting Rehab Potential (+) age, motivated, acute condition   PT Frequency 2x / week   PT Duration 4 weeks   PT Treatment/Interventions Therapeutic exercise;Moist Heat;Electrical Stimulation;Cryotherapy;Ultrasound;Patient/family education;Manual techniques;Dry needling   PT Next Visit Plan pain control, posture instruciton, progressive exercise   PT Home Exercise Plan back stabilization, bridging, clam, lumbar rotation   Consulted and Agree with Plan of Care Patient        Problem List There are no active problems to display for this patient.   Blythe Stanford, SPT 11/23/2015, 3:21 PM  [place] [facility] [address] [place], [address] Phone: [phone]   Fax:  [phone]  Name: Brooke Fox MRN: [id number] Date of Birth: May 24, 1991

## 2015-11-28 ENCOUNTER — Ambulatory Visit: Payer: PRIVATE HEALTH INSURANCE | Admitting: Physical Therapy

## 2015-11-28 ENCOUNTER — Encounter: Payer: Self-pay | Admitting: Physical Therapy

## 2015-11-28 DIAGNOSIS — M5442 Lumbago with sciatica, left side: Principal | ICD-10-CM

## 2015-11-28 DIAGNOSIS — M5441 Lumbago with sciatica, right side: Secondary | ICD-10-CM | POA: Diagnosis not present

## 2015-11-28 DIAGNOSIS — M62830 Muscle spasm of back: Secondary | ICD-10-CM

## 2015-11-28 NOTE — Therapy (Signed)
[facility] [address], [place], [address] Phone: [phone]   Fax:  [phone]  Physical Therapy Treatment  Patient Details  Name: Brooke Fox MRN: [id number] Date of Birth: 1990-12-09 Referring Provider: Jovita Gamma MD  Encounter Date: 11/28/2015      PT End of Session - 11/28/15 1401    Visit Number 7   Number of Visits 8   Date for PT Re-Evaluation 12/05/15   Authorization Type 7   Authorization Time Period 8 (WC)   PT Start Time 1142   PT Stop Time 1222   PT Time Calculation (min) 40 min   Activity Tolerance Patient tolerated treatment well   Behavior During Therapy [facility] for tasks assessed/performed      History reviewed. No pertinent past medical history.  Past Surgical History  Procedure Laterality Date  . Cesarean section      2014    There were no vitals filed for this visit.  Visit Diagnosis:  Bilateral low back pain with sciatica, sciatica laterality unspecified  Spasm of muscle, back     Subjective Assessment - 11/28/15   Subjective Patient reports no back pain upon entering the clinic and states her weekend shift was light and not as busy.    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 Pt reports she has been treating conservatively.    Limitations Sitting;Lifting;House hold activities;Standing;Other (comment)   Patient Stated Goals patient would like to be able to bend, walk and work without back pain and return to prior level of function   Currently in Pain? No   Effect of Pain on Daily Activities limits ability to perform household activities and work related activities without difficulty       Objective:  AROM: lumbar flexion: WNL; lumbar extension: WFL with mild soreness ER.  [facility] Adult PT Treatment/Exercise - 11/28/15   Exercises   Patient performed  exercises with guidance, verbal and tactile cues and demonstration of PT: Ramp walking with 6# dumbbell alternating sides -- 42ft up/down x 6 Hip machine -- #55 ABD, EXT/ADD #70 -- x15 each LE Standing Lumbar flexion/ext at [place]-- x 15 #10 Ball toss against rebounder single leg stance with contralateral LE supported on compliant ball -- x25 each side Resisted walking with grey tubing -- x 10 side-stepping, walking forward/backwards     Patient Response to Treatment: Increased lumbar symptoms after performing resisted lumbar flexion/ext in standing which resolved after performing ramp walking. Increased muscle soreness after performing exercises on the affected side versus the non-affected side. Minimal cuing required to perform exercises with good technique and alignment          PT Education - 11/28/15 1400    Education provided Yes   Education Details Resisted walking forward/backward and side stepping with gray tube.   Person(s) Educated Patient   Methods Explanation;Demonstration   Comprehension Verbalized understanding;Returned demonstration             PT Long Term Goals - 11/07/15 1040    PT LONG TERM GOAL #1   Title Patient will demonstrate improved function with daily tasks at home/work as indicated by modified Oswestry score of <10% by 12/05/2015   Baseline modified oswestry 18%   Status New   PT LONG TERM GOAL #2   Title Patient will report maximum pain level in back 3-4/10 with aggravating activties such as pulling/pushing by  12/05/2015    Baseline Pain level ranges from 2/10 up to 8/10   Status New   PT LONG TERM GOAL #3   Title Patient will be independent with home program for pain control and exercise in order to transition to self managment for return to prior level of function by 12/05/2015   Baseline limited knowledge of appropriate exercises and pain control strategies    Status New               Plan - 11/28/15 1402    Clinical Impression  Statement Patient is progressing well towards long term goals with decreased pain upon entering the clinic today indicating functional carryover between visits. Pt tolerated increase in exercise progression without increase in symptoms but still lacks appropiate muscular endurance to acheive functional goals. Pt will benefit from further skilled therapy to address endurance and improve motor control to return to prior level of function.     Pt will benefit from skilled therapeutic intervention in order to improve on the following deficits Decreased strength;Pain;Increased muscle spasms;Decreased range of motion;Improper body mechanics;Impaired flexibility   Rehab Potential Good   Clinical Impairments Affecting Rehab Potential (+) age, motivated, acute condition   PT Frequency 2x / week   PT Duration 4 weeks   PT Treatment/Interventions Therapeutic exercise;Moist Heat;Electrical Stimulation;Cryotherapy;Ultrasound;Patient/family education;Manual techniques;Dry needling   PT Next Visit Plan pain control, posture instruciton, progressive exercise   PT Home Exercise Plan back stabilization, bridging, clam, lumbar rotation   Consulted and Agree with Plan of Care Patient        Problem List There are no active problems to display for this patient.   Blythe Stanford, SPT 11/28/2015, 2:11 PM  [facility] [address], [place], [address] Phone: [phone]   Fax:  [phone]  Name: Brooke Fox MRN: [id number] Date of Birth: 1991-06-29

## 2015-11-30 ENCOUNTER — Encounter: Payer: Self-pay | Admitting: Physical Therapy

## 2015-11-30 ENCOUNTER — Ambulatory Visit: Payer: PRIVATE HEALTH INSURANCE | Attending: Neurosurgery | Admitting: Physical Therapy

## 2015-11-30 DIAGNOSIS — M5442 Lumbago with sciatica, left side: Secondary | ICD-10-CM | POA: Diagnosis present

## 2015-11-30 DIAGNOSIS — M5441 Lumbago with sciatica, right side: Secondary | ICD-10-CM | POA: Insufficient documentation

## 2015-11-30 DIAGNOSIS — M62830 Muscle spasm of back: Secondary | ICD-10-CM | POA: Insufficient documentation

## 2015-11-30 NOTE — Therapy (Signed)
[facility] [address] [place], [address] Phone: [phone]   Fax:  [phone]  Physical Therapy Treatment/Discharge Summary  Patient Details  Name: Brooke Fox MRN: [id number] Date of Birth: [birth year] Referring Provider: Jovita Gamma MD  Encounter Date: 11/30/2015  Patient began physical therapy 11/07/2015 and attended 8 sessions throught 11/30/2015 with goals achieved and patient independent with home program for self management of symptoms and exercises. Plan discharge from physical therapy       PT End of Session - 11/30/15 1215    Visit Number 8   Number of Visits 8   Date for PT Re-Evaluation 12/05/15   Authorization Type 8   Authorization Time Period 8 (WC)   PT Start Time 1115   PT Stop Time 1147   PT Time Calculation (min) 32 min   Activity Tolerance Patient tolerated treatment well   Behavior During Therapy [facility] for tasks assessed/performed      History reviewed. No pertinent past medical history.  Past Surgical History  Procedure Laterality Date  . Cesarean section      2014    There were no vitals filed for this visit.  Visit Diagnosis:  Bilateral low back pain with sciatica, sciatica laterality unspecified  Spasm of muscle, back      Subjective Assessment - 11/30/15 1120    Subjective Patient reports no back pain at rest and and experiencing slight pain at end range of lumbar extension. Patient states she feels prepared to be discharged as she is able to perform ADLs without pain and can continue with home program of exercises as instructed.    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 Pt reports she has been treating conservatively.    Limitations Sitting;Lifting;House hold activities;Standing;Other (comment)   Patient Stated Goals patient would like to be able to bend, walk and work without back pain  and return to prior level of function   Currently in Pain? No/denies   Pain Score 0-No pain       Objective: AROM lumbar spine: flexion WNL's with lower back pain no worse with repetition, extension: WNL --slight pain at end range, Strength: both LE's WFL's major muscle groups Palpation: No discomfort noted Special tests: Slump (-), SLR (-)  Posture: increased lumbar lordosis Flexibility: SLR bilaterally to 90 degrees  Sensation: grossly intact to light touch both LE's Muscular Activation: Tra and multifidus: strong with appropriate control    [facility] Adult PT Treatment/Exercise - 11/30/15   Exercises   Patient performed exercises with guidance, verbal and tactile cues and demonstration of PT:  Hip rotatry machine -- #70 ABD, EXT/ADD #100 -- x15 each LE Diagonal "wedding" Marches -- #10 bilaterally 70mn on ramp Ball  against rebounder single leg stance with contralateral LE supported on compliant ball -- x25 each side Resisted walking with grey tubing -- x 10 side-stepping, walking forward/backwards Prone hip ext -- x5 bilaterally    Objective Measures:  Modified Oswestry: 12% Patient Response to Treatment: No increase in pain after or during the performance of exercise. Required minimal cueing when preparing hip machine for exercise. Required no significant cueing to perform all other exercises. Patient verbalized good understanding of home program and self management of symptoms         PT Long Term Goals - 11/30/15 1218    PT LONG TERM GOAL #1   Title Patient  will demonstrate improved function with daily tasks at home/work as indicated by modified Oswestry score of <10% by 12/05/2015   Baseline modified oswestry 18% (11/30/15 12%)   Status Achieved   PT LONG TERM GOAL #2   Title Patient will report maximum pain level in back 3-4/10 with aggravating activties such as pulling/pushing by 12/05/2015    Baseline Pain level ranges from 2/10 up to 8/10 (11/30/2015  0/10 up to 3/10)   Status Achieved   PT LONG TERM GOAL #3   Title Patient will be independent with home program for pain control and exercise in order to transition to self managment for return to prior level of function by 12/05/2015   Baseline limited knowledge of appropriate exercises and pain control strategies (11/30/2015 independent with exercise performance)   Status Achieved               Plan - 11/30/15 1231    Clinical Impression Statement Patient has met long term goals in terms of independence with exercise, modified oswestry, and max. pain indicating functional improvement and return to prior level of function. Patient is able to manage exercises without cueing throughout session today and is ready for discharge from therapy.   Pt will benefit from skilled therapeutic intervention in order to improve on the following deficits Decreased strength;Pain;Increased muscle spasms;Decreased range of motion;Improper body mechanics;Impaired flexibility   Rehab Potential Good   Clinical Impairments Affecting Rehab Potential (+) age, motivated, acute condition   PT Frequency 2x / week   PT Duration 4 weeks   PT Treatment/Interventions Therapeutic exercise;Moist Heat;Electrical Stimulation;Cryotherapy;Ultrasound;Patient/family education;Manual techniques;Dry needling   PT Next Visit Plan Discharge from physical therapy   PT Home Exercise Plan back stabilization, bridging, clam, lumbar rotation   Consulted and Agree with Plan of Care Patient        Problem List There are no active problems to display for this patient.   Blythe Stanford, SPT 11/30/2015, 12:35 PM  [facility] [address] [place], [address] Phone: [phone]   Fax:  [phone]  Name: Brooke Fox MRN: [id number] Date of Birth: 1990/12/25

## 2016-01-16 ENCOUNTER — Encounter: Payer: Self-pay | Admitting: *Deleted

## 2016-01-19 ENCOUNTER — Encounter: Payer: Self-pay | Admitting: Obstetrics and Gynecology

## 2016-02-13 ENCOUNTER — Other Ambulatory Visit: Payer: Self-pay | Admitting: *Deleted

## 2016-02-13 ENCOUNTER — Telehealth: Payer: Self-pay | Admitting: Obstetrics and Gynecology

## 2016-02-13 MED ORDER — NORGESTIMATE-ETH ESTRADIOL 0.25-35 MG-MCG PO TABS: 1.0000 | ORAL_TABLET | Freq: Every day | ORAL | Status: DC

## 2016-02-13 NOTE — Telephone Encounter (Signed)
i moved [employer] out to July. She said she would need a refill on her [facility] because it will expire before her AE appt in July. Thank you

## 2016-02-13 NOTE — Telephone Encounter (Signed)
Done-ac 

## 2016-02-17 ENCOUNTER — Encounter: Payer: Self-pay | Admitting: Obstetrics and Gynecology

## 2016-02-20 ENCOUNTER — Other Ambulatory Visit: Payer: Self-pay | Admitting: Obstetrics and Gynecology

## 2016-04-05 ENCOUNTER — Encounter: Payer: Self-pay | Admitting: Obstetrics and Gynecology

## 2016-04-05 ENCOUNTER — Ambulatory Visit (INDEPENDENT_AMBULATORY_CARE_PROVIDER_SITE_OTHER): Payer: 59 | Admitting: Obstetrics and Gynecology

## 2016-04-05 ENCOUNTER — Other Ambulatory Visit: Payer: Self-pay | Admitting: Obstetrics and Gynecology

## 2016-04-05 VITALS — BP 141/83 | HR 74 | Ht 59.5 in | Wt 160.3 lb

## 2016-04-05 DIAGNOSIS — Z01419 Encounter for gynecological examination (general) (routine) without abnormal findings: Secondary | ICD-10-CM | POA: Diagnosis not present

## 2016-04-05 DIAGNOSIS — E559 Vitamin D deficiency, unspecified: Secondary | ICD-10-CM | POA: Diagnosis not present

## 2016-04-05 DIAGNOSIS — E669 Obesity, unspecified: Secondary | ICD-10-CM

## 2016-04-05 MED ORDER — NORGESTIMATE-ETH ESTRADIOL 0.25-35 MG-MCG PO TABS
ORAL_TABLET | ORAL | Status: DC
Start: 2016-04-05 — End: 2018-05-30

## 2016-04-05 NOTE — Progress Notes (Signed)
  Subjective:     Brooke Fox is a 25 y.o. female and is here for a comprehensive physical exam. The patient reports fatigue and weight gain since back injury.  Social History   Social History  . Marital Status: Married    Spouse Name: N/A  . Number of Children: N/A  . Years of Education: N/A   Occupational History  . Not on file.   Social History Main Topics  . Smoking status: Never Smoker   . Smokeless tobacco: Never Used  . Alcohol Use: No  . Drug Use: No  . Sexual Activity: Yes    Birth Control/ Protection: Pill   Other Topics Concern  . Not on file   Social History Narrative   Health Maintenance  Topic Date Due  . HIV Screening  11/10/2003  . TETANUS/TDAP  11/10/2007  . INFLUENZA VACCINE  05/01/2016  . PAP SMEAR  01/07/2017    The following portions of the patient's history were reviewed and updated as appropriate: allergies, current medications, past family history, past medical history, past social history, past surgical history and problem list.  Review of Systems Pertinent items noted in HPI and remainder of comprehensive ROS otherwise negative.   Objective:    General appearance: alert, cooperative, appears stated age and moderately obese Neck: no adenopathy, no carotid bruit, no JVD, supple, symmetrical, trachea midline and thyroid not enlarged, symmetric, no tenderness/mass/nodules Lungs: clear to auscultation bilaterally Breasts: normal appearance, no masses or tenderness Heart: regular rate and rhythm, S1, S2 normal, no murmur, click, rub or gallop Abdomen: soft, non-tender; bowel sounds normal; no masses,  no organomegaly Pelvic: cervix normal in appearance, external genitalia normal, no adnexal masses or tenderness, no cervical motion tenderness, rectovaginal septum normal, uterus normal size, shape, and consistency and vagina normal without discharge    Assessment:    Healthy female exam. Obesity, vitamin D deficiency      Plan:  Labs  obtained, desires start of weight loss plan- will schedule f/u appt.   See After Visit Summary for Counseling Recommendations

## 2016-04-05 NOTE — Patient Instructions (Signed)
  Place annual gynecologic exam patient instructions here.  Thank you for enrolling in [facility]. Please follow the instructions below to securely access your online medical record. MyChart allows you to send messages to your doctor, view your test results, manage appointments, and more.   How Do I Sign Up? 1. In your Internet browser, go to [employer] and enter [email or web address]. 2. Click on the Sign Up Now link in the Sign In box. You will see the New Member Sign Up page. 3. Enter your MyChart Access Code exactly as it appears below. You will not need to use this code after you've completed the sign-up process. If you do not sign up before the expiration date, you must request a new code.  MyChart Access Code: [email or web address] Expires: 05/09/2016  8:26 AM  4. Enter your Social Security Number ([id number]) and Date of Birth (mm/dd/yyyy) as indicated and click Submit. You will be taken to the next sign-up page. 5. Create a MyChart ID. This will be your MyChart login ID and cannot be changed, so think of one that is secure and easy to remember. 6. Create a MyChart password. You can change your password at any time. 7. Enter your Password Reset Question and Answer. This can be used at a later time if you forget your password.  8. Enter your e-mail address. You will receive e-mail notification when new information is available in [facility]. 9. Click Sign Up. You can now view your medical record.   Additional Information Remember, MyChart is NOT to be used for urgent needs. For medical emergencies, dial 911.

## 2016-04-06 ENCOUNTER — Other Ambulatory Visit: Payer: Self-pay | Admitting: Obstetrics and Gynecology

## 2016-04-06 ENCOUNTER — Ambulatory Visit (INDEPENDENT_AMBULATORY_CARE_PROVIDER_SITE_OTHER): Payer: 59 | Admitting: Obstetrics and Gynecology

## 2016-04-06 DIAGNOSIS — E669 Obesity, unspecified: Secondary | ICD-10-CM | POA: Diagnosis not present

## 2016-04-06 DIAGNOSIS — E559 Vitamin D deficiency, unspecified: Secondary | ICD-10-CM

## 2016-04-06 DIAGNOSIS — R7989 Other specified abnormal findings of blood chemistry: Secondary | ICD-10-CM

## 2016-04-06 DIAGNOSIS — E785 Hyperlipidemia, unspecified: Secondary | ICD-10-CM

## 2016-04-06 DIAGNOSIS — E538 Deficiency of other specified B group vitamins: Secondary | ICD-10-CM

## 2016-04-06 DIAGNOSIS — R946 Abnormal results of thyroid function studies: Secondary | ICD-10-CM

## 2016-04-06 LAB — COMPREHENSIVE METABOLIC PANEL
A/G RATIO: 1.6 (ref 1.2–2.2)
ALT: 14 IU/L (ref 0–32)
AST: 14 IU/L (ref 0–40)
Albumin: 4.2 g/dL (ref 3.5–5.5)
Alkaline Phosphatase: 65 IU/L (ref 39–117)
BUN/Creatinine Ratio: 12 (ref 9–23)
BUN: 7 mg/dL (ref 6–20)
CALCIUM: 9.3 mg/dL (ref 8.7–10.2)
CHLORIDE: 102 mmol/L (ref 96–106)
CO2: 19 mmol/L (ref 18–29)
Creatinine, Ser: 0.6 mg/dL (ref 0.57–1.00)
GFR, EST AFRICAN AMERICAN: 144 mL/min/{1.73_m2} (ref >59)
GFR, EST NON AFRICAN AMERICAN: 125 mL/min/{1.73_m2} (ref >59)
GLOBULIN, TOTAL: 2.7 g/dL (ref 1.5–4.5)
Glucose: 85 mg/dL (ref 65–99)
POTASSIUM: 4.4 mmol/L (ref 3.5–5.2)
SODIUM: 137 mmol/L (ref 134–144)
Total Protein: 6.9 g/dL (ref 6.0–8.5)

## 2016-04-06 LAB — CBC
Hematocrit: 36.7 % (ref 34.0–46.6)
Hemoglobin: 12.2 g/dL (ref 11.1–15.9)
MCH: 32.5 pg (ref 26.6–33.0)
MCHC: 33.2 g/dL (ref 31.5–35.7)
MCV: 98 fL — AB (ref 79–97)
PLATELETS: 294 10*3/uL (ref 150–379)
RBC: 3.75 x10E6/uL — AB (ref 3.77–5.28)
RDW: 12.5 % (ref 12.3–15.4)
WBC: 8.3 10*3/uL (ref 3.4–10.8)

## 2016-04-06 LAB — HEMOGLOBIN A1C
ESTIMATED AVERAGE GLUCOSE: 80 mg/dL
Hgb A1c MFr Bld: 4.4 % — ABNORMAL LOW (ref 4.8–5.6)

## 2016-04-06 LAB — LIPID PANEL
Chol/HDL Ratio: 3.4 ratio units (ref 0.0–4.4)
Cholesterol, Total: 228 mg/dL — ABNORMAL HIGH (ref 100–199)
HDL: 67 mg/dL (ref >39)
LDL CALC: 126 mg/dL — AB (ref 0–99)
Triglycerides: 173 mg/dL — ABNORMAL HIGH (ref 0–149)
VLDL CHOLESTEROL CAL: 35 mg/dL (ref 5–40)

## 2016-04-06 LAB — VITAMIN D 25 HYDROXY (VIT D DEFICIENCY, FRACTURES): VIT D 25 HYDROXY: 17.7 ng/mL — AB (ref 30.0–100.0)

## 2016-04-06 LAB — CYTOLOGY - PAP

## 2016-04-06 LAB — THYROID PANEL WITH TSH
FREE THYROXINE INDEX: 1.4 (ref 1.2–4.9)
T3 Uptake Ratio: 18 % — ABNORMAL LOW (ref 24–39)
T4, Total: 7.9 ug/dL (ref 4.5–12.0)
TSH: 4.79 u[IU]/mL — AB (ref 0.450–4.500)

## 2016-04-06 LAB — VITAMIN B12: Vitamin B-12: 162 pg/mL — ABNORMAL LOW (ref 211–946)

## 2016-04-06 MED ORDER — ERGOCALCIFEROL 1.25 MG (50000 UT) PO CAPS: 50000.0000 [IU] | ORAL_CAPSULE | ORAL | Status: DC

## 2016-04-06 MED ORDER — CYANOCOBALAMIN 1000 MCG/ML IJ SOLN
1000.0000 ug | Freq: Once | INTRAMUSCULAR | Status: AC
  Administered 2016-04-06: 1000 ug via INTRAMUSCULAR

## 2016-04-06 MED ORDER — PHENTERMINE HCL 37.5 MG PO TABS: 37.5000 mg | ORAL_TABLET | Freq: Every day | ORAL | Status: DC

## 2016-04-06 MED ORDER — CYANOCOBALAMIN 1000 MCG/ML IJ SOLN: 1000.0000 ug | Freq: Once | INTRAMUSCULAR | Status: DC

## 2016-04-06 NOTE — Progress Notes (Signed)
Patient ID: Brooke Fox, female   DOB: 1991/03/21, 25 y.o.   MRN: [id number] Pt here to pick up rx for phentermine and get B12 injection to start weight loss program.

## 2016-05-10 ENCOUNTER — Encounter: Payer: Self-pay | Admitting: Obstetrics and Gynecology

## 2016-05-23 ENCOUNTER — Other Ambulatory Visit: Payer: 59

## 2016-05-23 DIAGNOSIS — R946 Abnormal results of thyroid function studies: Secondary | ICD-10-CM | POA: Diagnosis not present

## 2016-05-23 NOTE — Addendum Note (Signed)
Addended by: Alphonsa Overall on: 05/23/2016 10:14 AM   Modules accepted: Orders

## 2016-05-24 LAB — THYROGLOBULIN ANTIBODY: Thyroglobulin Antibody: <1 IU/mL (ref 0.0–0.9)

## 2016-05-24 LAB — THYROID PANEL WITH TSH
Free Thyroxine Index: 1.5 (ref 1.2–4.9)
T3 Uptake Ratio: 20 % — ABNORMAL LOW (ref 24–39)
T4 TOTAL: 7.5 ug/dL (ref 4.5–12.0)
TSH: 4.47 u[IU]/mL (ref 0.450–4.500)

## 2016-05-30 DIAGNOSIS — H5203 Hypermetropia, bilateral: Secondary | ICD-10-CM | POA: Diagnosis not present

## 2016-05-30 DIAGNOSIS — H52222 Regular astigmatism, left eye: Secondary | ICD-10-CM | POA: Diagnosis not present

## 2016-06-15 ENCOUNTER — Encounter: Payer: Self-pay | Admitting: Physician Assistant

## 2016-06-15 ENCOUNTER — Ambulatory Visit: Payer: Self-pay | Admitting: Physician Assistant

## 2016-06-15 VITALS — BP 134/88 | HR 76 | Temp 98.3°F

## 2016-06-15 DIAGNOSIS — J040 Acute laryngitis: Secondary | ICD-10-CM

## 2016-06-15 MED ORDER — METHYLPREDNISOLONE 4 MG PO TBPK: ORAL_TABLET | ORAL | 0 refills | Status: DC

## 2016-06-15 NOTE — Progress Notes (Signed)
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

## 2016-06-15 NOTE — Progress Notes (Signed)
Contacted Kristy @ [facility] requesting appt for patient. Faxed over notes ,Demographic and ins. Per Marita [place] will contact patient with appt day and time.Patient was notified.

## 2016-06-20 NOTE — Progress Notes (Signed)
[facility]. With Dr. Richardson Landry on 06/29/2016 @ 1:45.

## 2016-06-29 DIAGNOSIS — R490 Dysphonia: Secondary | ICD-10-CM | POA: Diagnosis not present

## 2016-06-29 DIAGNOSIS — J382 Nodules of vocal cords: Secondary | ICD-10-CM | POA: Diagnosis not present

## 2016-06-29 DIAGNOSIS — K219 Gastro-esophageal reflux disease without esophagitis: Secondary | ICD-10-CM | POA: Diagnosis not present

## 2016-08-10 DIAGNOSIS — K219 Gastro-esophageal reflux disease without esophagitis: Secondary | ICD-10-CM | POA: Diagnosis not present

## 2016-08-10 DIAGNOSIS — J382 Nodules of vocal cords: Secondary | ICD-10-CM | POA: Diagnosis not present

## 2016-09-06 ENCOUNTER — Encounter: Payer: Self-pay | Admitting: *Deleted

## 2016-09-06 ENCOUNTER — Ambulatory Visit
Admission: EM | Admit: 2016-09-06 | Discharge: 2016-09-06 | Disposition: A | Discharge status: Home / Self Care | Payer: 59 | Attending: Family Medicine | Admitting: Family Medicine

## 2016-09-06 DIAGNOSIS — R05 Cough: Secondary | ICD-10-CM | POA: Diagnosis not present

## 2016-09-06 DIAGNOSIS — J00 Acute nasopharyngitis [common cold]: Secondary | ICD-10-CM

## 2016-09-06 DIAGNOSIS — J40 Bronchitis, not specified as acute or chronic: Secondary | ICD-10-CM

## 2016-09-06 DIAGNOSIS — R059 Cough, unspecified: Secondary | ICD-10-CM

## 2016-09-06 MED ORDER — AZITHROMYCIN 250 MG PO TABS: 250.0000 mg | ORAL_TABLET | Freq: Every day | ORAL | 0 refills | Status: DC

## 2016-09-06 MED ORDER — IPRATROPIUM BROMIDE 0.06 % NA SOLN: 2.0000 | Freq: Four times a day (QID) | NASAL | 12 refills | Status: DC

## 2016-09-06 MED ORDER — BENZONATATE 100 MG PO CAPS: 200.0000 mg | ORAL_CAPSULE | Freq: Three times a day (TID) | ORAL | 0 refills | Status: DC | PRN

## 2016-09-06 MED ORDER — IPRATROPIUM BROMIDE 0.02 % IN SOLN: 0.5000 mg | Freq: Four times a day (QID) | RESPIRATORY_TRACT | 12 refills | Status: DC

## 2016-09-06 MED ORDER — ALBUTEROL SULFATE (2.5 MG/3ML) 0.083% IN NEBU
2.5000 mg | INHALATION_SOLUTION | Freq: Once | RESPIRATORY_TRACT | Status: AC
  Administered 2016-09-06: 2.5 mg via RESPIRATORY_TRACT

## 2016-09-06 NOTE — ED Provider Notes (Signed)
CSN: [id number]     Arrival date & time 09/06/16  1427 History   First MD Initiated Contact with Patient 09/06/16 1501     Chief Complaint  Patient presents with  . Cough  . Nasal Congestion  . Otalgia   (Consider location/radiation/quality/duration/timing/severity/associated sxs/prior Treatment) Patient c/o nasal drainage and persistent dry hacky cough for 1.5 weeks.  She states initially her cough was productive but recently it has been dry.  She denies fever.  She states she is having headache and pain in her ribs from coughing so much.   The history is provided by the patient.  Cough  Cough characteristics:  Non-productive Severity:  Moderate Onset quality:  Gradual Duration:  2 weeks Timing:  Constant Progression:  Worsening Chronicity:  New Smoker: no   Context: upper respiratory infection   Relieved by:  Nothing Worsened by:  Activity and deep breathing Ineffective treatments:  None tried Associated symptoms: chest pain, ear pain and headaches   Risk factors: recent infection   Otalgia  Associated symptoms: cough and headaches     Past Medical History:  Diagnosis Date  . GERD (gastroesophageal reflux disease)   . Heart murmur    Past Surgical History:  Procedure Laterality Date  . CESAREAN SECTION     2014   Family History  Problem Relation Age of Onset  . Heart disease Father   . Cancer Maternal Aunt     breast   Social History  Substance Use Topics  . Smoking status: Never Smoker  . Smokeless tobacco: Never Used  . Alcohol use No   OB History    Gravida Para Term Preterm AB Living   1         1   SAB TAB Ectopic Multiple Live Births           1     Review of Systems  Constitutional: Negative.   HENT: Positive for ear pain.   Eyes: Negative.   Respiratory: Positive for cough.   Cardiovascular: Positive for chest pain.  Gastrointestinal: Negative.   Endocrine: Negative.   Genitourinary: Negative.   Musculoskeletal: Negative.   Skin:  Negative.   Allergic/Immunologic: Negative.   Neurological: Positive for headaches.  Hematological: Negative.   Psychiatric/Behavioral: Negative.     Allergies  Patient has no known allergies.  Home Medications   Prior to Admission medications   Medication Sig Start Date End Date Taking? Authorizing Provider  norgestimate-ethinyl estradiol (MONO-LINYAH) 0.25-35 MG-MCG tablet TAKE 1 TABLET BY MOUTH ONCE DAILY AS DIRECTED CONTINUOSLY 04/05/16  Yes Melody N Shambley, CNM  omeprazole (PRILOSEC) 40 MG capsule Take 40 mg by mouth daily.   Yes Historical Provider, MD  cyanocobalamin (,VITAMIN B-12,) 1000 MCG/ML injection Inject 1 mL (1,000 mcg total) into the muscle once. 04/06/16   Melody N Shambley, CNM  ergocalciferol (VITAMIN D2) 50000 units capsule Take 1 capsule (50,000 Units total) by mouth 2 (two) times a week. Reported on 04/05/2016 04/06/16   Melody N Shambley, CNM  methylPREDNISolone (MEDROL DOSEPAK) 4 MG TBPK tablet Take 6 pills on day one then decrease by 1 pill each day 06/15/16   Versie Starks, PA-C  phentermine (ADIPEX-P) 37.5 MG tablet Take 1 tablet (37.5 mg total) by mouth daily before breakfast. Patient not taking: Reported on 06/15/2016 04/06/16   Melody N Shambley, CNM   Meds Ordered and Administered this Visit   Medications  albuterol (PROVENTIL) (2.5 MG/3ML) 0.083% nebulizer solution 2.5 mg (not administered)    BP Marland Kitchen)  107/92 (BP Location: Left Arm)   Pulse (!) 117   Temp 98.8 F (37.1 C) (Oral)   Resp 16   Ht 4\' 11"  (1.499 m)   Wt 158 lb (71.7 kg)   LMP 08/09/2016   SpO2 100%   BMI 31.91 kg/m  No data found.   Physical Exam  Constitutional: She is oriented to person, place, and time. She appears well-developed and well-nourished.  HENT:  Head: Normocephalic and atraumatic.  Right Ear: External ear normal.  Left Ear: External ear normal.  Mouth/Throat: Oropharynx is clear and moist.  Eyes: Conjunctivae and EOM are normal. Pupils are equal, round, and reactive to  light.  Neck: Normal range of motion. Neck supple.  Cardiovascular: Normal rate, regular rhythm and normal heart sounds.   Pulmonary/Chest: Effort normal and breath sounds normal.  Abdominal: Soft. Bowel sounds are normal.  Neurological: She is alert and oriented to person, place, and time.  Nursing note and vitals reviewed.   Urgent Care Course   Clinical Course     Procedures (including critical care time)  Labs Review Labs Reviewed - No data to display  Imaging Review No results found.   Visual Acuity Review  Right Eye Distance:   Left Eye Distance:   Bilateral Distance:    Right Eye Near:   Left Eye Near:    Bilateral Near:         MDM  Bronchitis Cough Nasopharyngitis  Zpak Ipratropium 0.06% 2 sprays per nostril qid prn #72ml Tessalon Perles 200mg  one po tid prn #21  Push po fluids, rest, tylenol and motrin otc prn as directed for fever, arthralgias, and myalgias.  Follow up prn if sx's continue or persist.    Lysbeth Penner, FNP 09/06/16 [phone]

## 2016-09-06 NOTE — ED Triage Notes (Signed)
Patient started having symptoms of nasal drainage productive cough and ear pain 1.5 weeks ago. No fever or sore throat reported.

## 2016-11-02 ENCOUNTER — Ambulatory Visit
Admission: RE | Admit: 2016-11-02 | Discharge: 2016-11-02 | Disposition: A | Discharge status: Home / Self Care | Payer: 59 | Source: Ambulatory Visit | Attending: Physician Assistant | Admitting: Physician Assistant

## 2016-11-02 ENCOUNTER — Ambulatory Visit: Payer: Self-pay | Admitting: Physician Assistant

## 2016-11-02 ENCOUNTER — Encounter: Payer: Self-pay | Admitting: Physician Assistant

## 2016-11-02 VITALS — BP 130/80 | HR 83 | Temp 98.3°F

## 2016-11-02 DIAGNOSIS — J209 Acute bronchitis, unspecified: Secondary | ICD-10-CM

## 2016-11-02 DIAGNOSIS — R05 Cough: Secondary | ICD-10-CM | POA: Diagnosis not present

## 2016-11-02 MED ORDER — PREDNISONE 10 MG (48) PO TBPK: ORAL_TABLET | Freq: Every day | ORAL | 0 refills | Status: DC

## 2016-11-02 MED ORDER — ALBUTEROL SULFATE HFA 108 (90 BASE) MCG/ACT IN AERS: 2.0000 | INHALATION_SPRAY | Freq: Four times a day (QID) | RESPIRATORY_TRACT | 0 refills | Status: DC | PRN

## 2016-11-02 MED ORDER — HYDROCOD POLST-CPM POLST ER 10-8 MG/5ML PO SUER: 5.0000 mL | Freq: Two times a day (BID) | ORAL | 0 refills | Status: DC | PRN

## 2016-11-02 NOTE — Progress Notes (Signed)
S: C/o cough and congestion with wheezing for about 2 months, chest is sore from coughing, denies fever, chills; states cough is dry and hacking; keeping pt awake at night;  denies cardiac type chest pain or sob, v/d, abd pain, was seen at urgent care and given a zpack, then did e-visit and they gave her doxy, noone has given her a steroid and thinks that might help Remainder ros neg  O: vitals wnl, nad, tms clear, throat injected, neck supple no lymph, lungs with c t a, cv rrr, neuro intact, cough is dry and hacking  A:  Acute bronchitis   P:  rx medication, sterapred ds 10mg  12 d dose pack, albuterol inhaler, cxr ap and lateral, if shows pneumonia will add levaquin,  use otc meds, tylenol or motrin as needed for fever/chills, return if not better in 3 -5 days, return earlier if worsening

## 2017-04-09 ENCOUNTER — Other Ambulatory Visit: Payer: Self-pay | Admitting: Obstetrics and Gynecology

## 2017-05-29 ENCOUNTER — Encounter: Payer: Self-pay | Admitting: Obstetrics and Gynecology

## 2017-05-29 ENCOUNTER — Ambulatory Visit (INDEPENDENT_AMBULATORY_CARE_PROVIDER_SITE_OTHER): Payer: 59 | Admitting: Obstetrics and Gynecology

## 2017-05-29 VITALS — BP 128/84 | HR 87 | Ht 59.5 in | Wt 162.0 lb

## 2017-05-29 DIAGNOSIS — Z01419 Encounter for gynecological examination (general) (routine) without abnormal findings: Secondary | ICD-10-CM | POA: Diagnosis not present

## 2017-05-29 DIAGNOSIS — E559 Vitamin D deficiency, unspecified: Secondary | ICD-10-CM | POA: Diagnosis not present

## 2017-05-29 DIAGNOSIS — E538 Deficiency of other specified B group vitamins: Secondary | ICD-10-CM | POA: Diagnosis not present

## 2017-05-29 MED ORDER — NORGESTIMATE-ETH ESTRADIOL 0.25-35 MG-MCG PO TABS: ORAL_TABLET | ORAL | 4 refills | Status: DC

## 2017-05-29 NOTE — Progress Notes (Signed)
   Subjective:     Brooke Fox is a married white  26 y.o. female and is here for a comprehensive physical exam. Working FT at [employer] .The patient reports problems - fatigue.  Social History   Social History  . Marital status: Married    Spouse name: N/A  . Number of children: N/A  . Years of education: N/A   Occupational History  . Not on file.   Social History Main Topics  . Smoking status: Never Smoker  . Smokeless tobacco: Never Used  . Alcohol use No  . Drug use: No  . Sexual activity: Yes    Birth control/ protection: Pill   Other Topics Concern  . Not on file   Social History Narrative  . No narrative on file   Health Maintenance  Topic Date Due  . HIV Screening  11/10/2003  . TETANUS/TDAP  11/10/2007  . INFLUENZA VACCINE  05/01/2017  . PAP SMEAR  04/06/2019    The following portions of the patient's history were reviewed and updated as appropriate: allergies, current medications, past family history, past medical history, past social history, past surgical history and problem list.  Review of Systems Pertinent items noted in HPI and remainder of comprehensive ROS otherwise negative.   Objective:    General appearance: alert, cooperative and appears stated age Neck: no adenopathy, no carotid bruit, no JVD, supple, symmetrical, trachea midline and thyroid not enlarged, symmetric, no tenderness/mass/nodules Lungs: clear to auscultation bilaterally Breasts: normal appearance, no masses or tenderness Heart: systolic murmur: holosystolic 6/6, decrescendo throughout the precordium Abdomen: soft, non-tender; bowel sounds normal; no masses,  no organomegaly Pelvic: cervix normal in appearance, external genitalia normal, no adnexal masses or tenderness, no cervical motion tenderness, rectovaginal septum normal, uterus normal size, shape, and consistency and vagina normal without discharge    Assessment:    Healthy female exam. Fatigue; vit D & B12 deficiencies,  elevated cholesterol     Plan:    labs obtained, will follow up accordingly OCPs refilled. RTC 1 year or as needed.  Melody Gayla Medicus, CNM See After Visit Summary for Counseling Recommendations

## 2017-05-30 LAB — COMPREHENSIVE METABOLIC PANEL
ALBUMIN: 4.4 g/dL (ref 3.5–5.5)
ALT: 15 IU/L (ref 0–32)
AST: 13 IU/L (ref 0–40)
Albumin/Globulin Ratio: 1.6 (ref 1.2–2.2)
Alkaline Phosphatase: 63 IU/L (ref 39–117)
BUN / CREAT RATIO: 14 (ref 9–23)
BUN: 9 mg/dL (ref 6–20)
Bilirubin Total: 0.3 mg/dL (ref 0.0–1.2)
CALCIUM: 9.3 mg/dL (ref 8.7–10.2)
CO2: 22 mmol/L (ref 20–29)
CREATININE: 0.65 mg/dL (ref 0.57–1.00)
Chloride: 103 mmol/L (ref 96–106)
GFR calc Af Amer: 140 mL/min/{1.73_m2} (ref >59)
GFR, EST NON AFRICAN AMERICAN: 121 mL/min/{1.73_m2} (ref >59)
GLOBULIN, TOTAL: 2.7 g/dL (ref 1.5–4.5)
GLUCOSE: 101 mg/dL — AB (ref 65–99)
Potassium: 4.8 mmol/L (ref 3.5–5.2)
SODIUM: 139 mmol/L (ref 134–144)
Total Protein: 7.1 g/dL (ref 6.0–8.5)

## 2017-05-30 LAB — CBC
HEMOGLOBIN: 12.2 g/dL (ref 11.1–15.9)
Hematocrit: 35.6 % (ref 34.0–46.6)
MCH: 32.4 pg (ref 26.6–33.0)
MCHC: 34.3 g/dL (ref 31.5–35.7)
MCV: 94 fL (ref 79–97)
Platelets: 282 10*3/uL (ref 150–379)
RBC: 3.77 x10E6/uL (ref 3.77–5.28)
RDW: 12.9 % (ref 12.3–15.4)
WBC: 9.6 10*3/uL (ref 3.4–10.8)

## 2017-05-30 LAB — FERRITIN: FERRITIN: 72 ng/mL (ref 15–150)

## 2017-05-30 LAB — LIPID PANEL
CHOLESTEROL TOTAL: 217 mg/dL — AB (ref 100–199)
Chol/HDL Ratio: 3.3 ratio (ref 0.0–4.4)
HDL: 65 mg/dL (ref >39)
LDL Calculated: 87 mg/dL (ref 0–99)
Triglycerides: 325 mg/dL — ABNORMAL HIGH (ref 0–149)
VLDL Cholesterol Cal: 65 mg/dL — ABNORMAL HIGH (ref 5–40)

## 2017-05-30 LAB — B12 AND FOLATE PANEL
FOLATE: 17.7 ng/mL (ref >3.0)
Vitamin B-12: 224 pg/mL — ABNORMAL LOW (ref 232–1245)

## 2017-05-30 LAB — HEMOGLOBIN A1C
ESTIMATED AVERAGE GLUCOSE: 80 mg/dL
Hgb A1c MFr Bld: 4.4 % — ABNORMAL LOW (ref 4.8–5.6)

## 2017-05-30 LAB — THYROID PANEL WITH TSH
FREE THYROXINE INDEX: 1.4 (ref 1.2–4.9)
T3 Uptake Ratio: 18 % — ABNORMAL LOW (ref 24–39)
T4 TOTAL: 7.5 ug/dL (ref 4.5–12.0)
TSH: 3.58 u[IU]/mL (ref 0.450–4.500)

## 2017-05-30 LAB — VITAMIN D 25 HYDROXY (VIT D DEFICIENCY, FRACTURES): Vit D, 25-Hydroxy: 28.3 ng/mL — ABNORMAL LOW (ref 30.0–100.0)

## 2017-06-04 ENCOUNTER — Other Ambulatory Visit: Payer: Self-pay | Admitting: Obstetrics and Gynecology

## 2017-06-04 DIAGNOSIS — E559 Vitamin D deficiency, unspecified: Secondary | ICD-10-CM

## 2017-06-04 DIAGNOSIS — E538 Deficiency of other specified B group vitamins: Secondary | ICD-10-CM

## 2017-06-04 DIAGNOSIS — E782 Mixed hyperlipidemia: Secondary | ICD-10-CM

## 2017-06-04 MED ORDER — CYANOCOBALAMIN 1000 MCG/ML IJ SOLN: 1000.0000 ug | INTRAMUSCULAR | 1 refills | Status: DC

## 2017-06-04 MED ORDER — VITAMIN D (ERGOCALCIFEROL) 1.25 MG (50000 UNIT) PO CAPS: 50000.0000 [IU] | ORAL_CAPSULE | ORAL | 1 refills | Status: DC

## 2017-06-10 ENCOUNTER — Encounter: Payer: Self-pay | Admitting: Physician Assistant

## 2017-06-10 ENCOUNTER — Ambulatory Visit: Payer: Self-pay | Admitting: Physician Assistant

## 2017-06-10 VITALS — BP 120/70 | HR 91 | Temp 98.5°F | Resp 16

## 2017-06-10 DIAGNOSIS — H05012 Cellulitis of left orbit: Secondary | ICD-10-CM

## 2017-06-10 DIAGNOSIS — H1032 Unspecified acute conjunctivitis, left eye: Secondary | ICD-10-CM

## 2017-06-10 MED ORDER — AMOXICILLIN-POT CLAVULANATE 875-125 MG PO TABS: 1.0000 | ORAL_TABLET | Freq: Two times a day (BID) | ORAL | 0 refills | Status: DC

## 2017-06-10 MED ORDER — SULFACETAMIDE-PREDNISOLONE 10-0.2 % OP SUSP: 1.0000 [drp] | OPHTHALMIC | 0 refills | Status: DC

## 2017-06-10 NOTE — Progress Notes (Signed)
S: here for ?pink eye, child had pink eye last week, now her eye is red swollen, draining and matted, also skin around the eye is getting red, denies fever/chills, did use some of her son's eye drops without relief  O: vitals wnl, nad, perrl eomi, conjunctiva is very red, no matting at this time, no drianage at this time but pt is wiping eye with tissue constantly, skin in orbit is pink, ENT wnl, neck supple no lymph, lungs c t a, cv rrr,  A; acute conjunctivitis, orbital cellulitis  P: recheck tomorrow, bleph 10 opth drops, augmentin 875mg  bid

## 2017-06-11 ENCOUNTER — Ambulatory Visit: Payer: Self-pay | Admitting: Physician Assistant

## 2017-06-11 ENCOUNTER — Encounter: Payer: Self-pay | Admitting: Physician Assistant

## 2017-06-11 VITALS — BP 130/84 | HR 77 | Temp 98.4°F

## 2017-06-11 DIAGNOSIS — H1032 Unspecified acute conjunctivitis, left eye: Secondary | ICD-10-CM

## 2017-06-11 NOTE — Progress Notes (Signed)
S: pt here for recheck due to orbital cellulitis, states is much better, no fever/chills, very little matting this morning, using drops as instructed  O:vitals wnl, nad, perrl eomi, skin around orbit has improved and is almost to normal color, conjunctiva is still very red but less irritated, n/v intact  A: recheck conjunctivitis and orbital cellulitis  P: continue medications, if not matted in the morning may return to work on thurs, if still has matting should stay out of work thurs

## 2017-06-12 ENCOUNTER — Ambulatory Visit: Payer: Self-pay | Admitting: Physician Assistant

## 2017-06-12 ENCOUNTER — Encounter: Payer: Self-pay | Admitting: Physician Assistant

## 2017-06-12 VITALS — Temp 98.4°F

## 2017-06-12 DIAGNOSIS — Z7689 Persons encountering health services in other specified circumstances: Secondary | ICD-10-CM

## 2017-06-12 NOTE — Progress Notes (Signed)
S: pt states her eye is much better, would like to go back to work, had small amount of crusting when she woke up this morning but no drainage, no fever/chills  O: vitals wnl, nad, perrl eomi, conjunctiva improved but still a little red, cellulitis around the eye has resolved, n/v intact  A: resolving conjunctivitis, resolved cellulitis  P: may return to work due to drastic improvement from last 2 days

## 2017-07-02 ENCOUNTER — Emergency Department
Admission: EM | Admit: 2017-07-02 | Discharge: 2017-07-02 | Disposition: A | Discharge status: Home / Self Care | Payer: 59 | Attending: Emergency Medicine | Admitting: Emergency Medicine

## 2017-07-02 ENCOUNTER — Encounter: Payer: Self-pay | Admitting: Emergency Medicine

## 2017-07-02 ENCOUNTER — Emergency Department: Payer: 59

## 2017-07-02 DIAGNOSIS — Y9389 Activity, other specified: Secondary | ICD-10-CM | POA: Insufficient documentation

## 2017-07-02 DIAGNOSIS — Z79899 Other long term (current) drug therapy: Secondary | ICD-10-CM | POA: Insufficient documentation

## 2017-07-02 DIAGNOSIS — Y999 Unspecified external cause status: Secondary | ICD-10-CM | POA: Diagnosis not present

## 2017-07-02 DIAGNOSIS — S0181XA Laceration without foreign body of other part of head, initial encounter: Secondary | ICD-10-CM | POA: Insufficient documentation

## 2017-07-02 DIAGNOSIS — Y92410 Unspecified street and highway as the place of occurrence of the external cause: Secondary | ICD-10-CM | POA: Insufficient documentation

## 2017-07-02 DIAGNOSIS — S098XXA Other specified injuries of head, initial encounter: Secondary | ICD-10-CM | POA: Diagnosis present

## 2017-07-02 DIAGNOSIS — S0101XA Laceration without foreign body of scalp, initial encounter: Secondary | ICD-10-CM | POA: Diagnosis not present

## 2017-07-02 MED ORDER — LIDOCAINE HCL (PF) 1 % IJ SOLN
INTRAMUSCULAR | Status: AC
  Filled 2017-07-02: qty 5

## 2017-07-02 MED ORDER — OXYCODONE-ACETAMINOPHEN 5-325 MG PO TABS
1.0000 | ORAL_TABLET | Freq: Once | ORAL | Status: AC
  Administered 2017-07-02: 1 via ORAL
  Filled 2017-07-02: qty 1

## 2017-07-02 MED ORDER — TRAMADOL HCL 50 MG PO TABS: 50.0000 mg | ORAL_TABLET | Freq: Four times a day (QID) | ORAL | 0 refills | Status: DC | PRN

## 2017-07-02 MED ORDER — BACITRACIN ZINC 500 UNIT/GM EX OINT
TOPICAL_OINTMENT | Freq: Two times a day (BID) | CUTANEOUS | Status: DC
  Administered 2017-07-02: 1 via TOPICAL
  Filled 2017-07-02: qty 0.9

## 2017-07-02 MED ORDER — SULFAMETHOXAZOLE-TRIMETHOPRIM 800-160 MG PO TABS: 1.0000 | ORAL_TABLET | Freq: Two times a day (BID) | ORAL | 0 refills | Status: DC
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
## 2017-07-10 ENCOUNTER — Encounter: Payer: Self-pay | Admitting: Physician Assistant

## 2017-07-10 ENCOUNTER — Ambulatory Visit: Payer: Self-pay | Admitting: Physician Assistant

## 2017-07-10 VITALS — BP 130/70 | HR 69 | Temp 98.3°F

## 2017-07-10 DIAGNOSIS — Z4802 Encounter for removal of sutures: Secondary | ICD-10-CM
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
## 2017-07-22 ENCOUNTER — Ambulatory Visit: Payer: Self-pay | Admitting: Physician Assistant

## 2017-07-22 ENCOUNTER — Encounter: Payer: Self-pay | Admitting: Physician Assistant

## 2017-07-22 VITALS — BP 120/80 | HR 77 | Temp 98.5°F

## 2017-07-22 DIAGNOSIS — R300 Dysuria: Secondary | ICD-10-CM

## 2017-07-22 LAB — POCT URINALYSIS DIPSTICK
Bilirubin, UA: NEGATIVE
GLUCOSE UA: NEGATIVE
KETONES UA: NEGATIVE
Nitrite, UA: NEGATIVE
Protein, UA: NEGATIVE
SPEC GRAV UA: 1.01 (ref 1.010–1.025)
Urobilinogen, UA: 0.2 E.U./dL
pH, UA: 7 (ref 5.0–8.0)

## 2017-07-22 MED ORDER — NITROFURANTOIN MONOHYD MACRO 100 MG PO CAPS: 100.0000 mg | ORAL_CAPSULE | Freq: Two times a day (BID) | ORAL | 0 refills | Status: DC

## 2017-07-22 NOTE — Progress Notes (Signed)
S:  C/o uti sx for 2 days, hematuria,  urgency, frequency, denies vaginal discharge, abdominal pain or flank pain:  Remainder ros neg  O:  Vitals wnl, nad, no cva tenderness, back nontender, lungs c t a,cv rrr, abd soft nontender, bs normal, n/v intact  A: uti  P: macrobid 100mg  bid x 7d, increase water intake, add cranberry juice, return if not improving in 2 -3 days, return earlier if worsening, discussed pyelonephritis sx

## 2017-11-25 DIAGNOSIS — H52222 Regular astigmatism, left eye: Secondary | ICD-10-CM | POA: Diagnosis not present

## 2017-11-25 DIAGNOSIS — H5203 Hypermetropia, bilateral: Secondary | ICD-10-CM | POA: Diagnosis not present

## 2017-12-17 ENCOUNTER — Ambulatory Visit (INDEPENDENT_AMBULATORY_CARE_PROVIDER_SITE_OTHER): Payer: Self-pay | Admitting: Family Medicine

## 2017-12-17 ENCOUNTER — Encounter: Payer: Self-pay | Admitting: Family Medicine

## 2017-12-17 VITALS — BP 142/74 | HR 78 | Temp 98.3°F | Wt 166.0 lb

## 2017-12-17 DIAGNOSIS — R05 Cough: Secondary | ICD-10-CM

## 2017-12-17 DIAGNOSIS — R059 Cough, unspecified: Secondary | ICD-10-CM

## 2017-12-17 DIAGNOSIS — J40 Bronchitis, not specified as acute or chronic: Secondary | ICD-10-CM

## 2017-12-17 MED ORDER — MONTELUKAST SODIUM 10 MG PO TABS: 10.0000 mg | ORAL_TABLET | Freq: Every day | ORAL | 0 refills | Status: DC

## 2017-12-17 MED ORDER — ALBUTEROL SULFATE HFA 108 (90 BASE) MCG/ACT IN AERS: 2.0000 | INHALATION_SPRAY | Freq: Four times a day (QID) | RESPIRATORY_TRACT | 0 refills | Status: DC | PRN

## 2017-12-17 MED ORDER — PREDNISONE 10 MG (21) PO TBPK: ORAL_TABLET | ORAL | 0 refills | Status: DC

## 2017-12-17 NOTE — Patient Instructions (Signed)

## 2017-12-17 NOTE — Progress Notes (Signed)
Started 4wks ago cold symptom nasal drainage now has chest congestion/hoarseness and cough(dry) OTC: mucinex  theraflu

## 2017-12-17 NOTE — Progress Notes (Signed)
Brooke Fox is a 27 y.o. female who is here for evaluation of 4 weeks of cough and congestion symptoms. She denies fever or purulent sputum but is concerned about frequent cough and recent loss of voice.   Review of Systems  Constitutional: Negative.   HENT: Positive for sore throat.   Eyes: Negative.   Respiratory: Positive for cough.   Cardiovascular: Negative.   Gastrointestinal: Negative.   Genitourinary: Negative.   Musculoskeletal: Negative.   Skin: Negative.   Neurological: Negative.   Endo/Heme/Allergies: Negative.   Psychiatric/Behavioral: Negative.     O: Vitals:   12/17/17 1459  BP: (!) 142/74  Pulse: 78  Temp: 98.3 F (36.8 C)  SpO2: 100%   Physical Exam  Constitutional: She is oriented to person, place, and time. Vital signs are normal. She appears well-developed and well-nourished.  HENT:  Head: Normocephalic.  Right Ear: Hearing, tympanic membrane, external ear and ear canal normal.  Left Ear: Hearing, tympanic membrane, external ear and ear canal normal.  Nose: Rhinorrhea present.  Mouth/Throat: Posterior oropharyngeal erythema present. No posterior oropharyngeal edema.  Neck: Normal range of motion. Neck supple.  Cardiovascular: Normal rate and regular rhythm.  Pulmonary/Chest: Effort normal and breath sounds normal.  Abdominal: Soft. Bowel sounds are normal.  Lymphadenopathy:       Head (right side): No submandibular adenopathy present.       Head (left side): No submandibular adenopathy present.  Neurological: She is alert and oriented to person, place, and time.  Skin: Skin is warm and dry.  Tight cough on exam.  A: 1. Bronchitis   2. Cough     P:  Diagnosis etiology and medication use and indications discussed and reviewed with patient who verbalized understanding and agrees with POC at this time.   1. Bronchitis - predniSONE (STERAPRED UNI-PAK 21 TAB) 10 MG (21) TBPK tablet; Take 6 tablets on day 1 and decrease dose by 1 tablet each day  for a total of 7 days. - albuterol (PROVENTIL HFA;VENTOLIN HFA) 108 (90 Base) MCG/ACT inhaler; Inhale 2 puffs into the lungs every 6 (six) hours as needed for wheezing or shortness of breath.  2. Cough - montelukast (SINGULAIR) 10 MG tablet; Take 1 tablet (10 mg total) by mouth at bedtime.

## 2017-12-19 ENCOUNTER — Telehealth: Payer: Self-pay | Admitting: Emergency Medicine

## 2017-12-19 NOTE — Telephone Encounter (Signed)
Left message follow up call with [facility] provider

## 2018-02-03 ENCOUNTER — Telehealth: Payer: 59 | Admitting: Family

## 2018-02-03 DIAGNOSIS — B9689 Other specified bacterial agents as the cause of diseases classified elsewhere: Secondary | ICD-10-CM

## 2018-02-03 DIAGNOSIS — J028 Acute pharyngitis due to other specified organisms: Secondary | ICD-10-CM | POA: Diagnosis not present

## 2018-02-03 MED ORDER — AZITHROMYCIN 250 MG PO TABS: ORAL_TABLET | ORAL | 0 refills | Status: DC

## 2018-02-03 MED ORDER — PREDNISONE 5 MG PO TABS: 5.0000 mg | ORAL_TABLET | ORAL | 0 refills | Status: DC

## 2018-02-03 MED ORDER — BENZONATATE 100 MG PO CAPS: 100.0000 mg | ORAL_CAPSULE | Freq: Three times a day (TID) | ORAL | 0 refills | Status: DC | PRN

## 2018-02-03 NOTE — Progress Notes (Signed)
Thank you for the details you included in the comment boxes. Those details are very helpful in determining the best course of treatment for you and help [place] to provide the best care. Please use the Tessalon in case it may help some, plus the other treatments below. Normally, we do not give antibiotics until day 5, yet with the chronic allergies, singulair, inhaler, and persistent fever, and worsening of your condition, we will prescribe them as below.   We are sorry that you are not feeling well.  Here is how we plan to help!  Based on your presentation I believe you most likely have A cough due to bacteria.  When patients have a fever and a productive cough with a change in color or increased sputum production, we are concerned about bacterial bronchitis.  If left untreated it can progress to pneumonia.  If your symptoms do not improve with your treatment plan it is important that you contact your provider.   I have prescribed Azithromyin 250 mg: two tablets now and then one tablet daily for 4 additonal days    In addition you may use A non-prescription cough medication called Mucinex DM: take 2 tablets every 12 hours. and A prescription cough medication called Tessalon Perles 100mg . You may take 1-2 capsules every 8 hours as needed for your cough.  Prednisone 5 mg daily for 6 days (see taper instructions below)  Directions for 6 day taper: Day 1: 2 tablets before breakfast, 1 after both lunch & dinner and 2 at bedtime Day 2: 1 tab before breakfast, 1 after both lunch & dinner and 2 at bedtime Day 3: 1 tab at each meal & 1 at bedtime Day 4: 1 tab at breakfast, 1 at lunch, 1 at bedtime Day 5: 1 tab at breakfast & 1 tab at bedtime Day 6: 1 tab at breakfast   From your responses in the eVisit questionnaire you describe inflammation in the upper respiratory tract which is causing a significant cough.  This is commonly called Bronchitis and has four common causes:    Allergies  Viral  Infections  Acid Reflux  Bacterial Infection Allergies, viruses and acid reflux are treated by controlling symptoms or eliminating the cause. An example might be a cough caused by taking certain blood pressure medications. You stop the cough by changing the medication. Another example might be a cough caused by acid reflux. Controlling the reflux helps control the cough.  USE OF BRONCHODILATOR ("RESCUE") INHALERS: There is a risk from using your bronchodilator too frequently.  The risk is that over-reliance on a medication which only relaxes the muscles surrounding the breathing tubes can reduce the effectiveness of medications prescribed to reduce swelling and congestion of the tubes themselves.  Although you feel brief relief from the bronchodilator inhaler, your asthma may actually be worsening with the tubes becoming more swollen and filled with mucus.  This can delay other crucial treatments, such as oral steroid medications. If you need to use a bronchodilator inhaler daily, several times per day, you should discuss this with your provider.  There are probably better treatments that could be used to keep your asthma under control.     HOME CARE . Only take medications as instructed by your medical team. . Complete the entire course of an antibiotic. . Drink plenty of fluids and get plenty of rest. . Avoid close contacts especially the very young and the elderly . Cover your mouth if you cough or cough into your sleeve. Marland Kitchen  Always remember to wash your hands . A steam or ultrasonic humidifier can help congestion.   GET HELP RIGHT AWAY IF: . You develop worsening fever. . You become short of breath . You cough up blood. . Your symptoms persist after you have completed your treatment plan MAKE SURE YOU   Understand these instructions.  Will watch your condition.  Will get help right away if you are not doing well or get worse.  Your e-visit answers were reviewed by a board certified  advanced clinical practitioner to complete your personal care plan.  Depending on the condition, your plan could have included both over the counter or prescription medications. If there is a problem please reply  once you have received a response from your provider. Your safety is important to [place].  If you have drug allergies check your prescription carefully.    You can use MyChart to ask questions about today's visit, request a non-urgent call back, or ask for a work or school excuse for 24 hours related to this e-Visit. If it has been greater than 24 hours you will need to follow up with your provider, or enter a new e-Visit to address those concerns. You will get an e-mail in the next two days asking about your experience.  I hope that your e-visit has been valuable and will speed your recovery. Thank you for using e-visits.

## 2018-05-08 ENCOUNTER — Telehealth: Payer: 59 | Admitting: Family

## 2018-05-08 DIAGNOSIS — J028 Acute pharyngitis due to other specified organisms: Secondary | ICD-10-CM

## 2018-05-08 DIAGNOSIS — B9689 Other specified bacterial agents as the cause of diseases classified elsewhere: Secondary | ICD-10-CM

## 2018-05-08 MED ORDER — AZITHROMYCIN 250 MG PO TABS: ORAL_TABLET | ORAL | 0 refills | Status: DC

## 2018-05-08 MED ORDER — PREDNISONE 5 MG PO TABS: 5.0000 mg | ORAL_TABLET | ORAL | 0 refills | Status: DC

## 2018-05-08 NOTE — Progress Notes (Signed)
Thank you for the details you included in the comment boxes. Those details are very helpful in determining the best course of treatment for you and help [place] to provide the best care.  We are sorry that you are not feeling well.  Here is how we plan to help!  Based on your presentation I believe you most likely have A cough due to bacteria.  When patients have a fever and a productive cough with a change in color or increased sputum production, we are concerned about bacterial bronchitis.  If left untreated it can progress to pneumonia.  If your symptoms do not improve with your treatment plan it is important that you contact your provider.   I have prescribed Azithromyin 250 mg: two tablets now and then one tablet daily for 4 additonal days    In addition you may use A non-prescription cough medication called Mucinex DM: take 2 tablets every 12 hours.  Prednisone 5 mg daily for 6 days (see taper instructions below)  Directions for 6 day taper: Day 1: 2 tablets before breakfast, 1 after both lunch & dinner and 2 at bedtime Day 2: 1 tab before breakfast, 1 after both lunch & dinner and 2 at bedtime Day 3: 1 tab at each meal & 1 at bedtime Day 4: 1 tab at breakfast, 1 at lunch, 1 at bedtime Day 5: 1 tab at breakfast & 1 tab at bedtime Day 6: 1 tab at breakfast   From your responses in the eVisit questionnaire you describe inflammation in the upper respiratory tract which is causing a significant cough.  This is commonly called Bronchitis and has four common causes:    Allergies  Viral Infections  Acid Reflux  Bacterial Infection Allergies, viruses and acid reflux are treated by controlling symptoms or eliminating the cause. An example might be a cough caused by taking certain blood pressure medications. You stop the cough by changing the medication. Another example might be a cough caused by acid reflux. Controlling the reflux helps control the cough.  USE OF BRONCHODILATOR ("RESCUE")  INHALERS: There is a risk from using your bronchodilator too frequently.  The risk is that over-reliance on a medication which only relaxes the muscles surrounding the breathing tubes can reduce the effectiveness of medications prescribed to reduce swelling and congestion of the tubes themselves.  Although you feel brief relief from the bronchodilator inhaler, your asthma may actually be worsening with the tubes becoming more swollen and filled with mucus.  This can delay other crucial treatments, such as oral steroid medications. If you need to use a bronchodilator inhaler daily, several times per day, you should discuss this with your provider.  There are probably better treatments that could be used to keep your asthma under control.     HOME CARE . Only take medications as instructed by your medical team. . Complete the entire course of an antibiotic. . Drink plenty of fluids and get plenty of rest. . Avoid close contacts especially the very young and the elderly . Cover your mouth if you cough or cough into your sleeve. . Always remember to wash your hands . A steam or ultrasonic humidifier can help congestion.   GET HELP RIGHT AWAY IF: . You develop worsening fever. . You become short of breath . You cough up blood. . Your symptoms persist after you have completed your treatment plan MAKE SURE YOU   Understand these instructions.  Will watch your condition.  Will get help right  away if you are not doing well or get worse.  Your e-visit answers were reviewed by a board certified advanced clinical practitioner to complete your personal care plan.  Depending on the condition, your plan could have included both over the counter or prescription medications. If there is a problem please reply  once you have received a response from your provider. Your safety is important to [place].  If you have drug allergies check your prescription carefully.    You can use MyChart to ask questions about  today's visit, request a non-urgent call back, or ask for a work or school excuse for 24 hours related to this e-Visit. If it has been greater than 24 hours you will need to follow up with your provider, or enter a new e-Visit to address those concerns. You will get an e-mail in the next two days asking about your experience.  I hope that your e-visit has been valuable and will speed your recovery. Thank you for using e-visits.

## 2018-05-30 ENCOUNTER — Ambulatory Visit (INDEPENDENT_AMBULATORY_CARE_PROVIDER_SITE_OTHER): Payer: 59 | Admitting: Obstetrics and Gynecology

## 2018-05-30 ENCOUNTER — Encounter: Payer: Self-pay | Admitting: Obstetrics and Gynecology

## 2018-05-30 ENCOUNTER — Other Ambulatory Visit (HOSPITAL_COMMUNITY)
Admission: RE | Admit: 2018-05-30 | Discharge: 2018-05-30 | Disposition: A | Discharge status: Home / Self Care | Payer: 59 | Source: Ambulatory Visit | Attending: Obstetrics and Gynecology | Admitting: Obstetrics and Gynecology

## 2018-05-30 VITALS — BP 125/77 | HR 101 | Ht <= 58 in | Wt 159.8 lb

## 2018-05-30 DIAGNOSIS — Z01419 Encounter for gynecological examination (general) (routine) without abnormal findings: Secondary | ICD-10-CM

## 2018-05-30 DIAGNOSIS — E559 Vitamin D deficiency, unspecified: Secondary | ICD-10-CM

## 2018-05-30 DIAGNOSIS — E538 Deficiency of other specified B group vitamins: Secondary | ICD-10-CM | POA: Diagnosis not present

## 2018-05-30 MED ORDER — NORGESTIMATE-ETH ESTRADIOL 0.25-35 MG-MCG PO TABS: ORAL_TABLET | ORAL | 3 refills | Status: DC

## 2018-05-30 NOTE — Patient Instructions (Signed)
Preventive Care 18-39 Years, Female Preventive care refers to lifestyle choices and visits with your health care provider that can promote health and wellness. What does preventive care include?  A yearly physical exam. This is also called an annual well check.  Dental exams once or twice a year.  Routine eye exams. Ask your health care provider how often you should have your eyes checked.  Personal lifestyle choices, including: ? Daily care of your teeth and gums. ? Regular physical activity. ? Eating a healthy diet. ? Avoiding tobacco and drug use. ? Limiting alcohol use. ? Practicing safe sex. ? Taking vitamin and mineral supplements as recommended by your health care provider. What happens during an annual well check? The services and screenings done by your health care provider during your annual well check will depend on your age, overall health, lifestyle risk factors, and family history of disease. Counseling Your health care provider may ask you questions about your:  Alcohol use.  Tobacco use.  Drug use.  Emotional well-being.  Home and relationship well-being.  Sexual activity.  Eating habits.  Work and work Statistician.  Method of birth control.  Menstrual cycle.  Pregnancy history.  Screening You may have the following tests or measurements:  Height, weight, and BMI.  Diabetes screening. This is done by checking your blood sugar (glucose) after you have not eaten for a while (fasting).  Blood pressure.  Lipid and cholesterol levels. These may be checked every 5 years starting at age 38.  Skin check.  Hepatitis C blood test.  Hepatitis B blood test.  Sexually transmitted disease (STD) testing.  BRCA-related cancer screening. This may be done if you have a family history of breast, ovarian, tubal, or peritoneal cancers.  Pelvic exam and Pap test. This may be done every 3 years starting at age 38. Starting at age 30, this may be done  every 5 years if you have a Pap test in combination with an HPV test.  Discuss your test results, treatment options, and if necessary, the need for more tests with your health care provider. Vaccines Your health care provider may recommend certain vaccines, such as:  Influenza vaccine. This is recommended every year.  Tetanus, diphtheria, and acellular pertussis (Tdap, Td) vaccine. You may need a Td booster every 10 years.  Varicella vaccine. You may need this if you have not been vaccinated.  HPV vaccine. If you are 39 or younger, you may need three doses over 6 months.  Measles, mumps, and rubella (MMR) vaccine. You may need at least one dose of MMR. You may also need a second dose.  Pneumococcal 13-valent conjugate (PCV13) vaccine. You may need this if you have certain conditions and were not previously vaccinated.  Pneumococcal polysaccharide (PPSV23) vaccine. You may need one or two doses if you smoke cigarettes or if you have certain conditions.  Meningococcal vaccine. One dose is recommended if you are age 68-21 years and a first-year college student living in a residence hall, or if you have one of several medical conditions. You may also need additional booster doses.  Hepatitis A vaccine. You may need this if you have certain conditions or if you travel or work in places where you may be exposed to hepatitis A.  Hepatitis B vaccine. You may need this if you have certain conditions or if you travel or work in places where you may be exposed to hepatitis B.  Haemophilus influenzae type b (Hib) vaccine. You may need this  if you have certain risk factors.  Talk to your health care provider about which screenings and vaccines you need and how often you need them. This information is not intended to replace advice given to you by your health care provider. Make sure you discuss any questions you have with your health care provider. Document Released: 11/13/2001 Document Revised:  06/06/2016 Document Reviewed: 07/19/2015 Elsevier Interactive Patient Education  2018 Elsevier Inc.  

## 2018-05-30 NOTE — Progress Notes (Signed)
Subjective:   Brooke Fox is a 27 y.o. G19P0 Caucasian female here for a routine well-woman exam.  Patient's last menstrual period was 05/16/2018.    Current complaints: fatigue PCP: none       does desire labs  Social History: Sexual: heterosexual Marital Status: married Living situation: with family Occupation: RT in Mammography at [employer] Tobacco/alcohol: no tobacco use Illicit drugs: no history of illicit drug use  The following portions of the patient's history were reviewed and updated as appropriate: allergies, current medications, past family history, past medical history, past social history, past surgical history and problem list.  Past Medical History Past Medical History:  Diagnosis Date  . GERD (gastroesophageal reflux disease)   . Heart murmur     Past Surgical History Past Surgical History:  Procedure Laterality Date  . CESAREAN SECTION     2014    Gynecologic History G1P0  Patient's last menstrual period was 05/16/2018. Contraception: OCP (estrogen/progesterone) Last Pap: 03/2016. Results were: normal   Obstetric History OB History  Gravida Para Term Preterm AB Living  1         1  SAB TAB Ectopic Multiple Live Births          1    # Outcome Date GA Lbr Len/2nd Weight Sex Delivery Anes PTL Lv  1 Gravida 08/18/13    M CS-Unspec   LIV    Current Medications Current Outpatient Medications on File Prior to Visit  Medication Sig Dispense Refill  . norgestimate-ethinyl estradiol (MONO-LINYAH) 0.25-35 MG-MCG tablet TAKE 1 TABLET BY MOUTH ONCE DAILY AS DIRECTED CONTINUOSLY 84 tablet 3  . albuterol (PROVENTIL HFA;VENTOLIN HFA) 108 (90 Base) MCG/ACT inhaler Inhale 2 puffs into the lungs every 6 (six) hours as needed for wheezing or shortness of breath. (Patient not taking: Reported on 05/30/2018) 1 Inhaler 0  . cyanocobalamin (,VITAMIN B-12,) 1000 MCG/ML injection Inject 1 mL (1,000 mcg total) into the muscle every 30 (thirty) days. (Patient not taking:  Reported on 12/17/2017) 10 mL 1  . montelukast (SINGULAIR) 10 MG tablet Take 1 tablet (10 mg total) by mouth at bedtime. (Patient not taking: Reported on 05/30/2018) 15 tablet 0  . nitrofurantoin, macrocrystal-monohydrate, (MACROBID) 100 MG capsule Take 1 capsule (100 mg total) by mouth 2 (two) times daily. (Patient not taking: Reported on 12/17/2017) 14 capsule 0  . omeprazole (PRILOSEC) 40 MG capsule Take 40 mg by mouth daily.    . predniSONE (DELTASONE) 5 MG tablet Take 1 tablet (5 mg total) by mouth as directed. Taper 6,5,4,3,2,1 (Patient not taking: Reported on 05/30/2018) 21 tablet 0  . Vitamin D, Ergocalciferol, (DRISDOL) 50000 units CAPS capsule Take 1 capsule (50,000 Units total) by mouth every 7 (seven) days. (Patient not taking: Reported on 05/30/2018) 30 capsule 1   No current facility-administered medications on file prior to visit.     Review of Systems Patient denies any headaches, blurred vision, shortness of breath, chest pain, abdominal pain, problems with bowel movements, urination, or intercourse.  Objective:  BP 125/77   Pulse (!) 101   Ht 4\' 10"  (1.473 m)   Wt 159 lb 12.8 oz (72.5 kg)   LMP 05/16/2018   BMI 33.40 kg/m  Physical Exam  General:  Well developed, well nourished, no acute distress. She is alert and oriented x3. Skin:  Warm and dry Neck:  Midline trachea, no thyromegaly or nodules Cardiovascular: Regular rate and rhythm, no murmur heard Lungs:  Effort normal, all lung fields clear to auscultation bilaterally  Breasts:  No dominant palpable mass, retraction, or nipple discharge Abdomen:  Soft, non tender, no hepatosplenomegaly or masses Pelvic:  External genitalia is normal in appearance.  The vagina is normal in appearance. The cervix is bulbous, no CMT.  Thin prep pap is done without HR HPV cotesting. Uterus is felt to be normal size, shape, and contour.  No adnexal masses or tenderness noted. Extremities:  No swelling or varicosities noted Psych:  She has a  normal mood and affect  Assessment:   Healthy well-woman exam H/o vit D deficiency and B12 deficiency Overweight   Plan:  Labs obtained -will follow up accordingly F/U 1 year for AE  Melody Rockney Ghee, CNM

## 2018-05-31 LAB — COMPREHENSIVE METABOLIC PANEL
A/G RATIO: 1.8 (ref 1.2–2.2)
ALT: 16 IU/L (ref 0–32)
AST: 13 IU/L (ref 0–40)
Albumin: 4.2 g/dL (ref 3.5–5.5)
Alkaline Phosphatase: 65 IU/L (ref 39–117)
BUN/Creatinine Ratio: 16 (ref 9–23)
BUN: 10 mg/dL (ref 6–20)
Bilirubin Total: 0.4 mg/dL (ref 0.0–1.2)
CO2: 21 mmol/L (ref 20–29)
Calcium: 8.9 mg/dL (ref 8.7–10.2)
Chloride: 105 mmol/L (ref 96–106)
Creatinine, Ser: 0.62 mg/dL (ref 0.57–1.00)
GFR, EST AFRICAN AMERICAN: 141 mL/min/{1.73_m2} (ref >59)
GFR, EST NON AFRICAN AMERICAN: 122 mL/min/{1.73_m2} (ref >59)
Globulin, Total: 2.4 g/dL (ref 1.5–4.5)
Glucose: 95 mg/dL (ref 65–99)
POTASSIUM: 4.1 mmol/L (ref 3.5–5.2)
Sodium: 139 mmol/L (ref 134–144)
Total Protein: 6.6 g/dL (ref 6.0–8.5)

## 2018-05-31 LAB — B12 AND FOLATE PANEL
FOLATE: 8.3 ng/mL (ref >3.0)
Vitamin B-12: 225 pg/mL — ABNORMAL LOW (ref 232–1245)

## 2018-05-31 LAB — LIPID PANEL
Chol/HDL Ratio: 3.5 ratio (ref 0.0–4.4)
Cholesterol, Total: 216 mg/dL — ABNORMAL HIGH (ref 100–199)
HDL: 62 mg/dL (ref >39)
LDL Calculated: 116 mg/dL — ABNORMAL HIGH (ref 0–99)
TRIGLYCERIDES: 188 mg/dL — AB (ref 0–149)
VLDL Cholesterol Cal: 38 mg/dL (ref 5–40)

## 2018-05-31 LAB — VITAMIN D 25 HYDROXY (VIT D DEFICIENCY, FRACTURES): Vit D, 25-Hydroxy: 19.2 ng/mL — ABNORMAL LOW (ref 30.0–100.0)

## 2018-06-04 ENCOUNTER — Other Ambulatory Visit: Payer: Self-pay | Admitting: Obstetrics and Gynecology

## 2018-06-04 DIAGNOSIS — E538 Deficiency of other specified B group vitamins: Secondary | ICD-10-CM

## 2018-06-04 DIAGNOSIS — E559 Vitamin D deficiency, unspecified: Secondary | ICD-10-CM

## 2018-06-04 LAB — CYTOLOGY - PAP
Chlamydia: NEGATIVE
Diagnosis: NEGATIVE
NEISSERIA GONORRHEA: NEGATIVE

## 2018-10-15 ENCOUNTER — Telehealth: Payer: No Typology Code available for payment source | Admitting: Nurse Practitioner

## 2018-10-15 DIAGNOSIS — N3000 Acute cystitis without hematuria: Secondary | ICD-10-CM | POA: Diagnosis not present

## 2018-10-15 MED ORDER — NITROFURANTOIN MONOHYD MACRO 100 MG PO CAPS: 100.0000 mg | ORAL_CAPSULE | Freq: Two times a day (BID) | ORAL | 0 refills | Status: DC

## 2018-10-15 NOTE — Progress Notes (Signed)

## 2018-12-22 MED FILL — VIT D2 1.25 MG (50,000 UNIT: 1.25 MG | 28 days supply | Qty: 4 | Fill #0

## 2019-06-03 ENCOUNTER — Encounter: Payer: 59 | Admitting: Obstetrics and Gynecology

## 2019-06-10 ENCOUNTER — Ambulatory Visit (INDEPENDENT_AMBULATORY_CARE_PROVIDER_SITE_OTHER): Payer: No Typology Code available for payment source | Admitting: Obstetrics and Gynecology

## 2019-06-10 ENCOUNTER — Other Ambulatory Visit: Payer: Self-pay

## 2019-06-10 ENCOUNTER — Encounter: Payer: Self-pay | Admitting: Obstetrics and Gynecology

## 2019-06-10 VITALS — BP 134/88 | HR 77 | Ht 59.5 in | Wt 171.0 lb

## 2019-06-10 DIAGNOSIS — Z01419 Encounter for gynecological examination (general) (routine) without abnormal findings: Secondary | ICD-10-CM

## 2019-06-10 DIAGNOSIS — Z6833 Body mass index (BMI) 33.0-33.9, adult: Secondary | ICD-10-CM | POA: Diagnosis not present

## 2019-06-10 DIAGNOSIS — E559 Vitamin D deficiency, unspecified: Secondary | ICD-10-CM | POA: Diagnosis not present

## 2019-06-10 DIAGNOSIS — E538 Deficiency of other specified B group vitamins: Secondary | ICD-10-CM

## 2019-06-10 MED ORDER — VITAMIN D (ERGOCALCIFEROL) 1.25 MG (50000 UNIT) PO CAPS: 50000.0000 [IU] | ORAL_CAPSULE | ORAL | 1 refills | Status: DC

## 2019-06-10 NOTE — Patient Instructions (Addendum)
 Preventive Care 21-30 Years Old, Female Preventive care refers to visits with your health care provider and lifestyle choices that can promote health and wellness. This includes:  A yearly physical exam. This may also be called an annual well check.  Regular dental visits and eye exams.  Immunizations.  Screening for certain conditions.  Healthy lifestyle choices, such as eating a healthy diet, getting regular exercise, not using drugs or products that contain nicotine and tobacco, and limiting alcohol use. What can I expect for my preventive care visit? Physical exam Your health care provider will check your:  Height and weight. This may be used to calculate body mass index (BMI), which tells if you are at a healthy weight.  Heart rate and blood pressure.  Skin for abnormal spots. Counseling Your health care provider may ask you questions about your:  Alcohol, tobacco, and drug use.  Emotional well-being.  Home and relationship well-being.  Sexual activity.  Eating habits.  Work and work environment.  Method of birth control.  Menstrual cycle.  Pregnancy history. What immunizations do I need?  Influenza (flu) vaccine  This is recommended every year. Tetanus, diphtheria, and pertussis (Tdap) vaccine  You may need a Td booster every 10 years. Varicella (chickenpox) vaccine  You may need this if you have not been vaccinated. Human papillomavirus (HPV) vaccine  If recommended by your health care provider, you may need three doses over 6 months. Measles, mumps, and rubella (MMR) vaccine  You may need at least one dose of MMR. You may also need a second dose. Meningococcal conjugate (MenACWY) vaccine  One dose is recommended if you are age 19-21 years and a first-year college student living in a residence hall, or if you have one of several medical conditions. You may also need additional booster doses. Pneumococcal conjugate (PCV13) vaccine  You may need  this if you have certain conditions and were not previously vaccinated. Pneumococcal polysaccharide (PPSV23) vaccine  You may need one or two doses if you smoke cigarettes or if you have certain conditions. Hepatitis A vaccine  You may need this if you have certain conditions or if you travel or work in places where you may be exposed to hepatitis A. Hepatitis B vaccine  You may need this if you have certain conditions or if you travel or work in places where you may be exposed to hepatitis B. Haemophilus influenzae type b (Hib) vaccine  You may need this if you have certain conditions. You may receive vaccines as individual doses or as more than one vaccine together in one shot (combination vaccines). Talk with your health care provider about the risks and benefits of combination vaccines. What tests do I need?  Blood tests  Lipid and cholesterol levels. These may be checked every 5 years starting at age 20.  Hepatitis C test.  Hepatitis B test. Screening  Diabetes screening. This is done by checking your blood sugar (glucose) after you have not eaten for a while (fasting).  Sexually transmitted disease (STD) testing.  BRCA-related cancer screening. This may be done if you have a family history of breast, ovarian, tubal, or peritoneal cancers.  Pelvic exam and Pap test. This may be done every 3 years starting at age 21. Starting at age 30, this may be done every 5 years if you have a Pap test in combination with an HPV test. Talk with your health care provider about your test results, treatment options, and if necessary, the need for more   tests. Follow these instructions at home: Eating and drinking   Eat a diet that includes fresh fruits and vegetables, whole grains, lean protein, and low-fat dairy.  Take vitamin and mineral supplements as recommended by your health care provider.  Do not drink alcohol if: ? Your health care provider tells you not to drink. ? You are  pregnant, may be pregnant, or are planning to become pregnant.  If you drink alcohol: ? Limit how much you have to 0-1 drink a day. ? Be aware of how much alcohol is in your drink. In the [place] one drink equals one 12 oz bottle of beer (355 mL), one 5 oz glass of wine (148 mL), or one 1 oz glass of hard liquor (44 mL). Lifestyle  Take daily care of your teeth and gums.  Stay active. Exercise for at least 30 minutes on 5 or more days each week.  Do not use any products that contain nicotine or tobacco, such as cigarettes, e-cigarettes, and chewing tobacco. If you need help quitting, ask your health care provider.  If you are sexually active, practice safe sex. Use a condom or other form of birth control (contraception) in order to prevent pregnancy and STIs (sexually transmitted infections). If you plan to become pregnant, see your health care provider for a preconception visit. What's next?  Visit your health care provider once a year for a well check visit.  Ask your health care provider how often you should have your eyes and teeth checked.  Stay up to date on all vaccines. This information is not intended to replace advice given to you by your health care provider. Make sure you discuss any questions you have with your health care provider. Document Released: 11/13/2001 Document Revised: 05/29/2018 Document Reviewed: 05/29/2018 Elsevier Patient Education  2020 [employer] Inc.  Vitamin D Deficiency Vitamin D deficiency is when your body does not have enough vitamin D. Vitamin D is important to your body for many reasons:  It helps the body absorb two important minerals-calcium and phosphorus.  It plays a role in bone health.  It may help to prevent some diseases, such as diabetes and multiple sclerosis.  It plays a role in muscle function, including heart function. If vitamin D deficiency is severe, it can cause a condition in which your bones become soft. In adults, this  condition is called osteomalacia. In children, this condition is called rickets. What are the causes? This condition may be caused by:  Not eating enough foods that contain vitamin D.  Not getting enough natural sun exposure.  Having certain digestive system diseases that make it difficult for your body to absorb vitamin D. These diseases include Crohn's disease, chronic pancreatitis, and cystic fibrosis.  Having a surgery in which a part of the stomach or a part of the small intestine is removed.  Having chronic kidney disease or liver disease. What increases the risk? You are more likely to develop this condition if you:  Are older.  Do not spend much time outdoors.  Live in a long-term care facility.  Have had broken bones.  Have weak or thin bones (osteoporosis).  Have a disease or condition that changes how the body absorbs vitamin D.  Have dark skin.  Take certain medicines, such as steroid medicines or certain seizure medicines.  Are overweight or obese. What are the signs or symptoms? In mild cases of vitamin D deficiency, there may not be any symptoms. If the condition is severe, symptoms may   include:  Bone pain.  Muscle pain.  Falling often.  Broken bones caused by a minor injury. How is this diagnosed? This condition may be diagnosed with blood tests. Imaging tests such as X-rays may also be done to look for changes in the bone. How is this treated? Treatment for this condition may depend on what caused the condition. Treatment options include:  Taking vitamin D supplements. Your health care provider will suggest what dose is best for you.  Taking a calcium supplement. Your health care provider will suggest what dose is best for you. Follow these instructions at home: Eating and drinking   Eat foods that contain vitamin D. Choices include: ? Fortified dairy products, cereals, or juices. Fortified means that vitamin D has been added to the food. Check  the label on the package to see if the food is fortified. ? Fatty fish, such as salmon or trout. ? Eggs. ? Oysters. ? Mushrooms. The items listed above may not be a complete list of recommended foods and beverages. Contact a dietitian for more information. General instructions  Take medicines and supplements only as told by your health care provider.  Get regular, safe exposure to natural sunlight.  Do not use a tanning bed.  Maintain a healthy weight. Lose weight if needed.  Keep all follow-up visits as told by your health care provider. This is important. How is this prevented? You can get vitamin D by:  Eating foods that naturally contain vitamin D.  Eating or drinking products that have been fortified with vitamin D, such as cereals, juices, and dairy products (including milk).  Taking a vitamin D supplement or a multivitamin supplement that contains vitamin D.  Being in the sun. Your body naturally makes vitamin D when your skin is exposed to sunlight. Your body changes the sunlight into a form of the vitamin that it can use. Contact a health care provider if:  Your symptoms do not go away.  You feel nauseous or you vomit.  You have fewer bowel movements than usual or are constipated. Summary  Vitamin D deficiency is when your body does not have enough vitamin D.  Vitamin D is important to your body for good bone health and muscle function, and it may help prevent some diseases.  Vitamin D deficiency is primarily treated through supplementation. Your health care provider will suggest what dose is best for you.  You can get vitamin D by eating foods that contain vitamin D, by being in the sun, and by taking a vitamin D supplement or a multivitamin supplement that contains vitamin D. This information is not intended to replace advice given to you by your health care provider. Make sure you discuss any questions you have with your health care provider. Document Released:  12/10/2011 Document Revised: 05/26/2018 Document Reviewed: 05/26/2018 Elsevier Patient Education  2020 [employer].

## 2019-06-10 NOTE — Progress Notes (Signed)
Subjective:   Brooke Fox is a 28 y.o. G46P0 Caucasian female here for a routine well-woman exam.  Patient's last menstrual period was 05/21/2019.    Current complaints: been trying for pregnancy for a year, cycles are normal, but spouse has low testosterone and is on shots.  PCP: me       does desire labs  Social History: Sexual: heterosexual Marital Status: married Living situation: with family Occupation: RT@[employer]  Tobacco/alcohol: no tobacco use Illicit drugs: no history of illicit drug use  The following portions of the patient's history were reviewed and updated as appropriate: allergies, current medications, past family history, past medical history, past social history, past surgical history and problem list.  Past Medical History Past Medical History:  Diagnosis Date  . GERD (gastroesophageal reflux disease)   . Heart murmur     Past Surgical History Past Surgical History:  Procedure Laterality Date  . CESAREAN SECTION     2014    Gynecologic History G1P0  Patient's last menstrual period was 05/21/2019. Contraception: none since 06/2018 Last Pap: 2019. Results were: normal   Obstetric History OB History  Gravida Para Term Preterm AB Living  1         1  SAB TAB Ectopic Multiple Live Births          1    # Outcome Date GA Lbr Len/2nd Weight Sex Delivery Anes PTL Lv  1 Gravida 08/18/13    M CS-Unspec   LIV    Current Medications Current Outpatient Medications on File Prior to Visit  Medication Sig Dispense Refill  . albuterol (PROVENTIL HFA;VENTOLIN HFA) 108 (90 Base) MCG/ACT inhaler Inhale 2 puffs into the lungs every 6 (six) hours as needed for wheezing or shortness of breath. (Patient not taking: Reported on 05/30/2018) 1 Inhaler 0  . cyanocobalamin (,VITAMIN B-12,) 1000 MCG/ML injection Inject 1 mL (1,000 mcg total) into the muscle every 30 (thirty) days. (Patient not taking: Reported on 12/17/2017) 10 mL 1  . montelukast (SINGULAIR) 10 MG tablet Take 1  tablet (10 mg total) by mouth at bedtime. (Patient not taking: Reported on 05/30/2018) 15 tablet 0  . norgestimate-ethinyl estradiol (MONO-LINYAH) 0.25-35 MG-MCG tablet TAKE 1 TABLET BY MOUTH ONCE DAILY AS DIRECTED CONTINUOSLY 84 tablet 3  . omeprazole (PRILOSEC) 40 MG capsule Take 40 mg by mouth daily.    . predniSONE (DELTASONE) 5 MG tablet Take 1 tablet (5 mg total) by mouth as directed. Taper 6,5,4,3,2,1 (Patient not taking: Reported on 05/30/2018) 21 tablet 0  . Vitamin D, Ergocalciferol, (DRISDOL) 50000 units CAPS capsule Take 1 capsule (50,000 Units total) by mouth every 7 (seven) days. (Patient not taking: Reported on 05/30/2018) 30 capsule 1   No current facility-administered medications on file prior to visit.     Review of Systems Patient denies any headaches, blurred vision, shortness of breath, chest pain, abdominal pain, problems with bowel movements, urination, or intercourse.  Objective:  BP 134/88   Pulse 77   Ht 4' 11.5" (1.511 m)   Wt 171 lb (77.6 kg)   LMP 05/21/2019   BMI 33.96 kg/m  Physical Exam  General:  Well developed, well nourished, no acute distress. She is alert and oriented x3. Skin:  Warm and dry Neck:  Midline trachea, no thyromegaly or nodules Cardiovascular: Regular rate and rhythm, no murmur heard Lungs:  Effort normal, all lung fields clear to auscultation bilaterally Breasts:  No dominant palpable mass, retraction, or nipple discharge Abdomen:  Soft, non tender, no  hepatosplenomegaly or masses Pelvic:  External genitalia is normal in appearance.  The vagina is normal in appearance. The cervix is bulbous, no CMT.  Thin prep pap is not done . Uterus is felt to be normal size, shape, and contour.  No adnexal masses or tenderness noted. Extremities:  No swelling or varicosities noted Psych:  She has a normal mood and affect  Assessment:   Healthy well-woman exam BMI 33 Vit D deficiency B12 deficiency   Plan:  Labs obtained-will follow up  accordingly F/U 1 year for AE, or sooner if needed   Master Touchet Rockney Ghee, CNM

## 2019-06-11 LAB — COMPREHENSIVE METABOLIC PANEL
ALT: 53 IU/L — ABNORMAL HIGH (ref 0–32)
AST: 66 IU/L — ABNORMAL HIGH (ref 0–40)
Albumin/Globulin Ratio: 2 (ref 1.2–2.2)
Albumin: 4.3 g/dL (ref 3.9–5.0)
Alkaline Phosphatase: 87 IU/L (ref 39–117)
BUN/Creatinine Ratio: 12 (ref 9–23)
BUN: 8 mg/dL (ref 6–20)
Bilirubin Total: 0.5 mg/dL (ref 0.0–1.2)
CO2: 21 mmol/L (ref 20–29)
Calcium: 8.9 mg/dL (ref 8.7–10.2)
Chloride: 104 mmol/L (ref 96–106)
Creatinine, Ser: 0.66 mg/dL (ref 0.57–1.00)
GFR calc Af Amer: 137 mL/min/{1.73_m2} (ref >59)
GFR calc non Af Amer: 119 mL/min/{1.73_m2} (ref >59)
Globulin, Total: 2.1 g/dL (ref 1.5–4.5)
Glucose: 84 mg/dL (ref 65–99)
Potassium: 4.4 mmol/L (ref 3.5–5.2)
Sodium: 138 mmol/L (ref 134–144)
Total Protein: 6.4 g/dL (ref 6.0–8.5)

## 2019-06-11 LAB — VITAMIN D 25 HYDROXY (VIT D DEFICIENCY, FRACTURES): Vit D, 25-Hydroxy: 20.6 ng/mL — ABNORMAL LOW (ref 30.0–100.0)

## 2019-06-11 LAB — B12 AND FOLATE PANEL
Folate: 4.8 ng/mL (ref >3.0)
Vitamin B-12: 401 pg/mL (ref 232–1245)

## 2019-06-11 LAB — LIPID PANEL
Chol/HDL Ratio: 2.7 ratio (ref 0.0–4.4)
Cholesterol, Total: 162 mg/dL (ref 100–199)
HDL: 60 mg/dL (ref >39)
LDL Chol Calc (NIH): 87 mg/dL (ref 0–99)
Triglycerides: 77 mg/dL (ref 0–149)
VLDL Cholesterol Cal: 15 mg/dL (ref 5–40)

## 2019-06-11 LAB — CBC
Hematocrit: 33.6 % — ABNORMAL LOW (ref 34.0–46.6)
Hemoglobin: 11.3 g/dL (ref 11.1–15.9)
MCH: 33.4 pg — ABNORMAL HIGH (ref 26.6–33.0)
MCHC: 33.6 g/dL (ref 31.5–35.7)
MCV: 99 fL — ABNORMAL HIGH (ref 79–97)
Platelets: 227 10*3/uL (ref 150–450)
RBC: 3.38 x10E6/uL — ABNORMAL LOW (ref 3.77–5.28)
RDW: 11.9 % (ref 11.7–15.4)
WBC: 7.2 10*3/uL (ref 3.4–10.8)

## 2019-06-11 LAB — HEMOGLOBIN A1C
Est. average glucose Bld gHb Est-mCnc: 82 mg/dL
Hgb A1c MFr Bld: 4.5 % — ABNORMAL LOW (ref 4.8–5.6)

## 2019-07-24 ENCOUNTER — Other Ambulatory Visit: Payer: Self-pay | Admitting: Obstetrics and Gynecology

## 2019-07-24 MED ORDER — ADAPALENE 0.1 % EX GEL: Freq: Every day | CUTANEOUS | 1 refills | Status: DC

## 2019-08-05 ENCOUNTER — Telehealth: Payer: Self-pay

## 2019-08-05 NOTE — Telephone Encounter (Signed)
Mychart message sent to patient- PA for adapalene was approved.

## 2019-09-21 ENCOUNTER — Telehealth: Payer: No Typology Code available for payment source | Admitting: Physician Assistant

## 2019-09-21 DIAGNOSIS — J029 Acute pharyngitis, unspecified: Secondary | ICD-10-CM

## 2019-09-21 DIAGNOSIS — R0981 Nasal congestion: Secondary | ICD-10-CM

## 2019-09-21 MED ORDER — IPRATROPIUM BROMIDE 0.03 % NA SOLN: 2.0000 | Freq: Three times a day (TID) | NASAL | 0 refills | Status: DC | PRN

## 2019-09-21 MED ORDER — PHENOL 1.4 % MT LIQD: 1.0000 | OROMUCOSAL | 0 refills | Status: DC | PRN

## 2019-09-21 NOTE — Progress Notes (Signed)
We are sorry that you are not feeling well.  Here is how we plan to help!  Based on what you have shared with me it looks like you have sinusitis.  Sinusitis is inflammation and infection in the sinus cavities of the head.  Based on your presentation I believe you most likely have Acute Viral Sinusitis. This is an infection most likely caused by a virus. There is not specific treatment for viral sinusitis other than to help you with the symptoms until the infection runs its course.  The vast majority of sinusitis and upper respiratory infections are viral and do not require antibiotics for treatment.   You may use an oral decongestant such as Mucinex D or if you have glaucoma or high blood pressure use plain Mucinex. Saline nasal spray help and can safely be used as often as needed for congestion, I have prescribed: Ipratropium Bromide nasal spray 0.03% 2 sprays in eah nostril 2-3 times a day. This is for nasal congestion. I am also prescribing phenol throat spray to use as needed for sore throat.   Some authorities believe that zinc sprays or the use of Echinacea may shorten the course of your symptoms.  Sinus infections are not as easily transmitted as other respiratory infection, however we still recommend that you avoid close contact with loved ones, especially the very young and elderly.  Remember to wash your hands thoroughly throughout the day as this is the number one way to prevent the spread of infection!  Home Care:  Only take medications as instructed by your medical team.  Do not take these medications with alcohol.  A steam or ultrasonic humidifier can help congestion.  You can place a towel over your head and breathe in the steam from hot water coming from a faucet.  Avoid close contacts especially the very young and the elderly.  Cover your mouth when you cough or sneeze.  Always remember to wash your hands.  Get Help Right Away If:  You develop worsening fever or sinus  pain.  You develop a severe head ache or visual changes.  Your symptoms persist after you have completed your treatment plan.  Make sure you  Understand these instructions.  Will watch your condition.  Will get help right away if you are not doing well or get worse.  Your e-visit answers were reviewed by a board certified advanced clinical practitioner to complete your personal care plan.  Depending on the condition, your plan could have included both over the counter or prescription medications.  If there is a problem please reply  once you have received a response from your provider.  Your safety is important to [place].  If you have drug allergies check your prescription carefully.    You can use MyChart to ask questions about today's visit, request a non-urgent call back, or ask for a work or school excuse for 24 hours related to this e-Visit. If it has been greater than 24 hours you will need to follow up with your provider, or enter a new e-Visit to address those concerns.  You will get an e-mail in the next two days asking about your experience.  I hope that your e-visit has been valuable and will speed your recovery. Thank you for using e-visits.  Greater than 5 minutes, yet less than 10 minutes of time have been spent researching, coordinating, and implementing care for this patient today.

## 2019-09-23 ENCOUNTER — Telehealth: Payer: No Typology Code available for payment source | Admitting: Family

## 2019-09-23 DIAGNOSIS — J069 Acute upper respiratory infection, unspecified: Secondary | ICD-10-CM

## 2019-09-23 MED ORDER — FLUTICASONE PROPIONATE 50 MCG/ACT NA SUSP: 2.0000 | Freq: Every day | NASAL | 6 refills | Status: DC

## 2019-09-23 MED ORDER — BENZONATATE 100 MG PO CAPS: 100.0000 mg | ORAL_CAPSULE | Freq: Three times a day (TID) | ORAL | 0 refills | Status: DC | PRN

## 2019-09-23 NOTE — Progress Notes (Signed)

## 2019-10-13 ENCOUNTER — Other Ambulatory Visit: Payer: Self-pay | Admitting: Obstetrics and Gynecology

## 2019-10-13 ENCOUNTER — Ambulatory Visit: Admission: RE | Admit: 2019-10-13 | Payer: No Typology Code available for payment source | Source: Ambulatory Visit

## 2019-10-13 DIAGNOSIS — Z3149 Encounter for other procreative investigation and testing: Secondary | ICD-10-CM

## 2019-11-18 ENCOUNTER — Ambulatory Visit
Admission: RE | Admit: 2019-11-18 | Discharge: 2019-11-18 | Disposition: A | Discharge status: Home / Self Care | Payer: No Typology Code available for payment source | Source: Ambulatory Visit | Attending: Obstetrics and Gynecology | Admitting: Obstetrics and Gynecology

## 2019-11-18 ENCOUNTER — Other Ambulatory Visit: Payer: Self-pay

## 2019-11-18 DIAGNOSIS — Z3149 Encounter for other procreative investigation and testing: Secondary | ICD-10-CM | POA: Insufficient documentation

## 2019-11-19 ENCOUNTER — Inpatient Hospital Stay: Admission: RE | Admit: 2019-11-19 | Payer: No Typology Code available for payment source | Source: Ambulatory Visit

## 2019-11-25 ENCOUNTER — Encounter: Payer: Self-pay | Admitting: Family

## 2019-11-25 ENCOUNTER — Other Ambulatory Visit: Payer: Self-pay

## 2019-11-25 ENCOUNTER — Ambulatory Visit (INDEPENDENT_AMBULATORY_CARE_PROVIDER_SITE_OTHER): Payer: No Typology Code available for payment source | Admitting: Family

## 2019-11-25 VITALS — Ht 59.5 in | Wt 170.0 lb

## 2019-11-25 DIAGNOSIS — R5383 Other fatigue: Secondary | ICD-10-CM | POA: Diagnosis not present

## 2019-11-25 DIAGNOSIS — Z0000 Encounter for general adult medical examination without abnormal findings: Secondary | ICD-10-CM | POA: Insufficient documentation

## 2019-11-25 DIAGNOSIS — Z7689 Persons encountering health services in other specified circumstances: Secondary | ICD-10-CM

## 2019-11-25 NOTE — Patient Instructions (Addendum)
Essentials for good sleep:   #1 Exercise #2 Limit Caffeine ( no caffeine after lunch) #3 No smart phones, TV prior to bed -- BLUE light is VERY activating and send the brain an 'awake message.'  #4 Go to bed at same time of night each night and get up at same time of day.  #5 Take 0.5 to 5mg  melatonin at 7pm with dinner -this is when natural melatonin will start to increase #6 May try over the counter Unisom for sleep aid  Let me know if at any point you would like repeat studies of your low back or a referral to orthopedics  Nice to meet you!

## 2019-11-25 NOTE — Progress Notes (Signed)
Virtual Visit via Video Note  I connected with@  on 11/25/19 at  3:00 PM EST by a video enabled telemedicine application and verified that I am speaking with the correct person using two identifiers.  Location patient: home Location provider:work  Persons participating in the virtual visit: patient, provider  I discussed the limitations of evaluation and management by telemedicine and the availability of in person appointments. The patient expressed understanding and agreed to proceed.   HPI: Prior pcp had been Dr Netty Starring.  Establish care. Would like to expand family. On PNV.  On cetirizine for seasonal allergies.  Has seen OB, Dr Leafy Ro,  since has been trying for one year for baby. Awaiting on further blood tests  Has had trouble staying asleep for past year, melatonin helps with falling asleep. Goes to bed same time every night, goes to bed at 9pm. Up at 6am. One soda midday. Exercise 2x per week.  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  States she has fatigue for a long time which in the past felt related to low vitamin d , b12. No unusual weight loss, night sweats. Doesn't snore.     MR lumbar dr Sherwood Gambler.    ROS: See pertinent positives and negatives per HPI.  Past Medical History:  Diagnosis Date  . GERD (gastroesophageal reflux disease)   . Heart murmur     Past Surgical History:  Procedure Laterality Date  . CESAREAN SECTION     2014    Family History  Problem Relation Age of Onset  . Heart disease Father   . Cancer Maternal Aunt        breast  . Anemia Mother   . Osteoporosis Mother     SOCIAL HX: never smoker   Current Outpatient Medications:  Marland Kitchen  Vitamin D, Ergocalciferol, (DRISDOL) 1.25 MG (50000 UT) CAPS capsule, Take 1 capsule (50,000 Units total) by mouth every 7 (seven) days., Disp: 30 capsule, Rfl: 1 .  cyanocobalamin (,VITAMIN B-12,) 1000  MCG/ML injection, Inject 1 mL (1,000 mcg total) into the muscle every 30 (thirty) days. (Patient not taking: Reported on 12/17/2017), Disp: 10 mL, Rfl: 1  EXAM:  VITALS per patient if applicable:  GENERAL: alert, oriented, appears well and in no acute distress  HEENT: atraumatic, conjunttiva clear, no obvious abnormalities on inspection of external nose and ears  NECK: normal movements of the head and neck  LUNGS: on inspection no signs of respiratory distress, breathing rate appears normal, no obvious gross SOB, gasping or wheezing  CV: no obvious cyanosis  MS: moves all visible extremities without noticeable abnormality  PSYCH/NEURO: pleasant and cooperative, no obvious depression or anxiety, speech and thought processing grossly intact  ASSESSMENT AND PLAN:  Discussed the following assessment and plan:  Fatigue, unspecified type - Plan: B12 and Folate Panel, IBC + Ferritin, VITAMIN D 25 Hydroxy (Vit-D Deficiency, Fractures)  Encounter to establish care Problem List Items Addressed This Visit      Other   Encounter to establish care   Fatigue - Primary    Chronic, stable.  Discussed with patient I suspect this is multifactorial including trouble staying asleep, low back pain,  inadequate exercise.  Discussed lab evaluation for metabolic reasons.  She had a recent TSH done with her OB/GYN as well as CBC so I deferred these labs. Discussed sleep hygiene, premedication with Tylenol for back pain, appropriate use of  melatonin and Unisom which we discussed are safe in pregnancy. Declines repeat images of low back or orthopedic referral. She will follow up in a couple months      Relevant Orders   B12 and Folate Panel   IBC + Ferritin   VITAMIN D 25 Hydroxy (Vit-D Deficiency, Fractures)      -we discussed possible serious and likely etiologies, options for evaluation and workup, limitations of telemedicine visit vs in person visit, treatment, treatment risks and precautions. Pt  prefers to treat via telemedicine empirically rather then risking or undertaking an in person visit at this moment. Patient agrees to seek prompt in person care if worsening, new symptoms arise, or if is not improving with treatment.   I discussed the assessment and treatment plan with the patient. The patient was provided an opportunity to ask questions and all were answered. The patient agreed with the plan and demonstrated an understanding of the instructions.   The patient was advised to call back or seek an in-person evaluation if the symptoms worsen or if the condition fails to improve as anticipated.   Mable Paris, FNP

## 2019-11-25 NOTE — Assessment & Plan Note (Addendum)
Chronic, stable.  Discussed with patient I suspect this is multifactorial including trouble staying asleep, low back pain,  inadequate exercise.  Discussed lab evaluation for metabolic reasons.  She had a recent TSH done with her OB/GYN as well as CBC so I deferred these labs. Discussed sleep hygiene, premedication with Tylenol for back pain, appropriate use of melatonin and Unisom which we discussed are safe in pregnancy. Declines repeat images of low back or orthopedic referral. She will follow up in a couple months

## 2019-11-26 NOTE — Progress Notes (Signed)
Printed and mailed

## 2019-11-30 ENCOUNTER — Other Ambulatory Visit (INDEPENDENT_AMBULATORY_CARE_PROVIDER_SITE_OTHER): Payer: No Typology Code available for payment source

## 2019-11-30 ENCOUNTER — Other Ambulatory Visit: Payer: Self-pay

## 2019-11-30 DIAGNOSIS — R5383 Other fatigue: Secondary | ICD-10-CM

## 2019-11-30 LAB — IBC + FERRITIN
Ferritin: 48.8 ng/mL (ref 10.0–291.0)
Iron: 85 ug/dL (ref 42–145)
Saturation Ratios: 30.1 % (ref 20.0–50.0)
Transferrin: 202 mg/dL — ABNORMAL LOW (ref 212.0–360.0)

## 2019-11-30 LAB — B12 AND FOLATE PANEL
Folate: 18 ng/mL (ref >5.9)
Vitamin B-12: 273 pg/mL (ref 211–911)

## 2019-11-30 LAB — VITAMIN D 25 HYDROXY (VIT D DEFICIENCY, FRACTURES): VITD: 38.45 ng/mL (ref 30.00–100.00)

## 2020-04-18 ENCOUNTER — Encounter: Payer: Self-pay | Admitting: Family

## 2020-05-25 ENCOUNTER — Other Ambulatory Visit: Payer: Self-pay

## 2020-05-25 ENCOUNTER — Encounter: Payer: Self-pay | Admitting: Family

## 2020-05-25 ENCOUNTER — Ambulatory Visit (INDEPENDENT_AMBULATORY_CARE_PROVIDER_SITE_OTHER): Payer: No Typology Code available for payment source | Admitting: Family

## 2020-05-25 DIAGNOSIS — R011 Cardiac murmur, unspecified: Secondary | ICD-10-CM | POA: Insufficient documentation

## 2020-05-25 DIAGNOSIS — M545 Low back pain, unspecified: Secondary | ICD-10-CM

## 2020-05-25 DIAGNOSIS — G8929 Other chronic pain: Secondary | ICD-10-CM

## 2020-05-25 DIAGNOSIS — R5383 Other fatigue: Secondary | ICD-10-CM | POA: Diagnosis not present

## 2020-05-25 DIAGNOSIS — M549 Dorsalgia, unspecified: Secondary | ICD-10-CM | POA: Insufficient documentation

## 2020-05-25 NOTE — Patient Instructions (Addendum)
NO NSAIDS  May trial tylenol  Referral to PT, echocardiogram  Let us know if you dont hear back within a week in regards to an appointment being scheduled.

## 2020-05-25 NOTE — Progress Notes (Signed)
Subjective:    Patient ID: Brooke Fox, female    DOB: 01-Apr-1991, 29 y.o.   MRN: [id number]  CC: Brooke Fox is a 29 y.o. female who presents today for follow up.   HPI:  Complains of chronic low back pain, x 4 years, comes and goes. 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  Has seen Dr Sherwood Gambler, neurosurgery who ordered MRI lumbar after initial injury. Did PT at that time which was helpful. Would like to do PT again. Heat improves pain.  occasional has numbness running down posterior left leg knee. Stretching does help. NO trouble urinating, or having a bowel movement. Hasnt tried any medication. No falls.  Injury in 2017 at work.   Sleep has improved. Fatigue improved as sleeping better on melatonin. Takes unisom as well.  fatigue comes and goes.  Doesn't snore.   H/o tricuspid regurgitation as child per patient. No dizziness.    MR 2017 lumbar showed mild degenerative disease HISTORY:  Past Medical History:  Diagnosis Date   GERD (gastroesophageal reflux disease)    Heart murmur    Past Surgical History:  Procedure Laterality Date   CESAREAN SECTION     2014   Family History  Problem Relation Age of Onset   Heart disease Father    Cancer Maternal Aunt        breast   Anemia Mother    Osteoporosis Mother     Allergies: Patient has no known allergies. Current Outpatient Medications on File Prior to Visit  Medication Sig Dispense Refill   Vitamin D, Ergocalciferol, (DRISDOL) 1.25 MG (50000 UT) CAPS capsule Take 1 capsule (50,000 Units total) by mouth every 7 (seven) days. 30 capsule 1   No current facility-administered medications on file prior to visit.    Social History   Tobacco Use   Smoking status: Never Smoker   Smokeless tobacco: Never Used  Vaping Use   Vaping Use: Never used  Substance Use Topics   Alcohol use: Yes    Comment: ocassionally   Drug use: No    Review of Systems  Constitutional: Negative for  chills and fever.  Respiratory: Negative for cough and shortness of breath.   Cardiovascular: Negative for chest pain and palpitations.  Gastrointestinal: Negative for nausea and vomiting.  Musculoskeletal: Positive for back pain.  Neurological: Positive for numbness. Negative for weakness and headaches.      Objective:    BP 130/80    Pulse 93    Temp 99.5 F (37.5 C) (Oral)    Ht 4' 11.5" (1.511 m)    Wt 174 lb 9.6 oz (79.2 kg)    SpO2 99%    BMI 34.67 kg/m  BP Readings from Last 3 Encounters:  05/25/20 130/80  06/10/19 134/88  05/30/18 125/77   Wt Readings from Last 3 Encounters:  05/25/20 174 lb 9.6 oz (79.2 kg)  11/25/19 170 lb (77.1 kg)  06/10/19 171 lb (77.6 kg)    Physical Exam Vitals reviewed.  Constitutional:      Appearance: She is well-developed.  Eyes:     Conjunctiva/sclera: Conjunctivae normal.  Cardiovascular:     Rate and Rhythm: Normal rate and regular rhythm.     Pulses: Normal pulses.     Heart sounds: Murmur heard.  Systolic murmur is present with a grade of 2/6.   Pulmonary:     Effort: Pulmonary effort is normal.     Breath sounds: Normal breath sounds. No wheezing, rhonchi or  rales.  Musculoskeletal:     Lumbar back: No swelling, edema, spasms, tenderness or bony tenderness. Normal range of motion.     Comments: Full range of motion with flexion, tension, lateral side bends. No bony tenderness. No pain, numbness, tingling elicited with single leg raise bilaterally.   Skin:    General: Skin is warm and dry.  Neurological:     Mental Status: She is alert.     Sensory: No sensory deficit.     Deep Tendon Reflexes:     Reflex Scores:      Patellar reflexes are 2+ on the right side and 2+ on the left side.    Comments: Sensation and strength intact bilateral lower extremities.  Psychiatric:        Speech: Speech normal.        Behavior: Behavior normal.        Thought Content: Thought content normal.        Assessment & Plan:   Problem  List Items Addressed This Visit      Other   Fatigue    Improved. Sleeping better. she declines further evaluation at this time.       Heart murmur    Pending echo      Low back pain    We agreed that PT referral appropriate first step. Advised heat, tylenol.  She will let me know how she is doing      Relevant Orders   Ambulatory referral to Physical Therapy       I have discontinued Kolbie C. Gustafson's cyanocobalamin. I am also having her maintain her Vitamin D (Ergocalciferol).   No orders of the defined types were placed in this encounter.   Return precautions given.   Risks, benefits, and alternatives of the medications and treatment plan prescribed today were discussed, and patient expressed understanding.   Education regarding symptom management and diagnosis given to patient on AVS.  Continue to follow with Burnard Hawthorne, FNP for routine health maintenance.   Brooke Fox and I agreed with plan.   Mable Paris, FNP

## 2020-05-25 NOTE — Assessment & Plan Note (Signed)
Pending echo

## 2020-05-25 NOTE — Assessment & Plan Note (Signed)
Improved. Sleeping better. she declines further evaluation at this time.

## 2020-05-25 NOTE — Assessment & Plan Note (Signed)
We agreed that PT referral appropriate first step. Advised heat, tylenol.  She will let me know how she is doing

## 2020-06-09 ENCOUNTER — Telehealth: Payer: Self-pay | Admitting: Family

## 2020-06-09 NOTE — Telephone Encounter (Signed)
Pt called to check on status of her  Echocardiogram being scheduled

## 2020-06-09 NOTE — Telephone Encounter (Signed)
Pt msg was left on vm to call [phone] to sch echo.

## 2020-06-09 NOTE — Telephone Encounter (Signed)
Pt was called and vm was left to call [phone] to sch.

## 2020-06-15 ENCOUNTER — Ambulatory Visit: Payer: No Typology Code available for payment source | Attending: Family

## 2020-06-15 ENCOUNTER — Other Ambulatory Visit: Payer: Self-pay

## 2020-06-15 DIAGNOSIS — M6281 Muscle weakness (generalized): Secondary | ICD-10-CM | POA: Diagnosis present

## 2020-06-15 DIAGNOSIS — M545 Low back pain, unspecified: Secondary | ICD-10-CM

## 2020-06-15 DIAGNOSIS — G8929 Other chronic pain: Secondary | ICD-10-CM | POA: Diagnosis present

## 2020-06-15 NOTE — Therapy (Signed)
[place] MAIN [facility] SERVICES [address] [place], [place], [address] Phone: [phone]   Fax:  [phone]  Physical Therapy Evaluation  Patient Details  Name: Brooke Fox MRN: [id number] Date of Birth: 06-10-1991 Referring Provider (PT): Mable Paris    Encounter Date: 06/15/2020   PT End of Session - 06/15/20 1713    Visit Number 1    Number of Visits 8    Date for PT Re-Evaluation 07/13/20    Authorization Type 1/10 eval 9/15    PT Start Time 1430    PT Stop Time 1518    PT Time Calculation (min) 48 min    Activity Tolerance Patient tolerated treatment well;Patient limited by pain    Behavior During Therapy [facility] for tasks assessed/performed           Past Medical History:  Diagnosis Date  . GERD (gastroesophageal reflux disease)   . Heart murmur     Past Surgical History:  Procedure Laterality Date  . CESAREAN SECTION     2014    There were no vitals filed for this visit.    Subjective Assessment - 06/15/20 1420    Subjective Patient is a pleasant 29 year old female who presents to physical therapy for low back pain that hs worsened in past year and radiates bilaterally with L>R.    Pertinent History Patient is a pleasant 29 year old female who presents to physical therapy for low back pain that hs worsened in past year and radiates bilaterally with L>R.  Patient has PMH of GERD, Heart murmur, and C section (2014). 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 Reports bending forward helps the pain, is in the process of trying for a second child and would like to resolve pain prior to pregnancy.    Limitations Sitting;Lifting;House hold activities;Standing;Other (comment);Walking    How long can you sit comfortably? 30  minutes total    How long can you stand comfortably? better than sitting ; hour    How long can you walk comfortably? impacts back, hour - 2 hours.    Diagnostic tests MRI from 2017: 1. Mild annular disc bulge at L4-5 and L5-S1 with resultant minimal left foraminal narrowing; Mild degenerative disc bulge at T11-12 without stenosis;  Otherwise normal MRI of the lumbar spine. No other significant degenerative changes identified.    Patient Stated Goals walk and work without back pain and return to prior level of function    Currently in Pain? Yes    Pain Score 5     Pain Location Back    Pain Orientation Lower    Pain Descriptors / Indicators Aching;Radiating    Pain Type Acute pain    Pain Radiating Towards bilateral LE , L>R    Pain Onset More than a month ago    Pain Frequency Constant    Aggravating Factors  lifting, laying flat, walking, work    Pain Relieving Factors rest, ice, bending forward    Effect of Pain on Daily Activities is affecting work, household activities, and QOL                  PAIN:  Low back:  Worst pain: 7/10 Least amount of pain 3/10 Current pain 5/10  Patient reports  pain radiates down bilateral LE's into feet, is more of a numbness and "asleep" feeling. Experiences L>R.   POSTURE: Seated: limited lumbar curvature, increased thoracic kyphosis Standing: anterior pelvic rotation, limited lumbar curvature increased thoracic kyphosis, equal weight shift.   PROM/AROM:  AROM BLE:  Trunk Flexion WFL, relieves pain  Trunk Extension 2 degrees, painful  Trunk R SB Limited 25% due to muscle guarding; pain in central spine*  Trunk L SB Limited 25%; pain in R side of back  Trunk R rotation Limited 25% *  Trunk L rotation Limited 25 % *  *Painful *repeated motions centralize pain  LE: Hip extension WFL: slight limitation due to being on feet all day Hamstring: slight limitation however is [facility]  Accessory Motions:   Grade II CPA: concurrent pain  with radiation L3-5. Grade II UPA: R 4-5 concurrent pain with radiation,    Left L4-5 UPA centralizes pain.  Sacrum: inferior glide central: no pain hypomobile, R and L hypomobile none painful   STRENGTH:  Graded on a 0-5 scale Muscle Group Left Right  Hip Flex 4/5 4+/5  Hip Abd 4/5 4+/5  Hip Add 3+/5 4-/5  Hip Ext 3+/5 4-/5  Hip IR/ER /5 /5  Knee Flex 4/5 4/5  Knee Ext 4/5 4/5  Ankle DF 4/5 4/5  Ankle PF 4/5 4/5   SENSATION:   BLE :  LLE slightly impaired to light touch  NEUROLOGICAL SCREEN: (2+ unless otherwise noted.) N=normal  Ab=abnormal   Level Dermatome R L  C3 Anterior Neck  N N  C4 Top of Shoulder N N  C5 Lateral Upper Arm  N N  C6 Lateral Arm/ Thumb  N N  C7 Middle Finger  N N  C8 4th & 5th Finger N N  T1 Medial Arm N N  L2 Medial thigh/groin N AB  L3 Lower thigh/med.knee N N  L4 Medial leg/lat thigh N AB  L5 Lat. leg & dorsal foot N N  S1 post/lat foot/thigh/leg N N  S2 Post./med. thigh & leg N AB    SOMATOSENSORY:  Any N & T in extremities or weakness: reports :         Sensation           Intact      Diminished         Absent  Light touch RLE L thigh                              SPECIAL TESTS: L slump + R SLR + - SI rule out (compression, distraction, sacral thrust) -Hip rule out tests-reports feels good on back (FABER, FAIR, SCOUR)   FUNCTIONAL MOBILITY: STS: WFL Supine to sit: increase in time due to pain Prone to sit: increase in time due to pain   Work tasks: Patient assist SPT from supine to sit: reaches over with legs straight and pulls with arms and back: educated on use of bent knees and utilizing LE's for transfer  Patient assist SPT squat pivot transfer: bent knees but rotate back to pull from plinth to chair, educated on utilizing foot movement to reduce back torsion.   GAIT: Limited step length bilaterally, muscle guarding resulting in poor trunk rotation   OUTCOME MEASURES: TEST Outcome Interpretation  5 times sit<>stand  6 sec >60 yo, >15 sec indicates increased risk for falls  FOTO 65% 73%       Objective measurements completed on examination: See  above findings.        Access Code: [email or web address] URL: [email or web address] Date: 06/15/2020 Prepared by: Janna Arch  Exercises Supine March with Posterior Pelvic Tilt - 1 x daily - 7 x weekly - 2 sets - 10 reps - 5 hold Standing Shoulder and Trunk Flexion at Table - 1 x daily - 7 x weekly - 2 sets - 2 reps - 10 hold Cat-Camel to Child's Pose - 1 x daily - 7 x weekly - 2 sets - 10 reps - 5 hold Seated Transversus Abdominis Bracing - 1 x daily - 7 x weekly - 2 sets - 10 reps - 5 hold         PT Education - 06/15/20 1713    Education provided Yes    Education Details goals, POC, HEP    Person(s) Educated Patient    Methods Explanation;Demonstration;Tactile cues;Verbal cues;Handout    Comprehension Verbalized understanding;Returned demonstration;Verbal cues required;Tactile cues required            PT Short Term Goals - 06/15/20 1719      PT SHORT TERM GOAL #1   Title Patient will be independent in home exercise program to improve strength/mobility for better functional independence with ADLs.    Baseline 9/15: HEP given    Time 2    Period Weeks    Status New    Target Date 06/29/20      PT SHORT TERM GOAL #2   Title Patient will report a worst pain of 5/10 on VAS in  low back  to improve tolerance with ADLs and reduced symptoms with activities.    Baseline 9/15: 7/10    Time 2    Period Weeks    Status New    Target Date 06/29/20             PT Long Term Goals - 06/15/20 1721      PT LONG TERM GOAL #1   Title Patient will increase FOTO score to equal to or greater than 73%    to demonstrate statistically significant improvement in mobility and quality of life.    Baseline 9/15: 65%    Time 4    Period Weeks    Status New    Target Date 07/13/20      PT LONG TERM GOAL #2   Title Patient will report a  worst pain of 3/10 on VAS in low back to improve tolerance with ADLs, work tasks of transfering patients, and reduced symptoms with activities.    Baseline 9/15: 7/10    Time 4    Period Weeks    Status New    Target Date 07/13/20      PT LONG TERM GOAL #3   Title Patient will be able to return to work task demands of squatting, transferring patients, and reaching with less than 2 point increase in VAS for improved quality of life.    Baseline 9/15: work tasks increase pain to 7/10    Time 4    Period Weeks    Status New    Target Date 07/13/20      PT LONG TERM GOAL #4   Title Patient will increase BLE gross strength to 4+/5 as to improve functional strength for independent gait, increased standing tolerance and increased ADL ability.    Baseline 9/15: see note    Time 4    Period Weeks    Status New    Target Date 07/13/20  Plan - 06/15/20 1715    Clinical Impression Statement Patient is a pleasant 29 year old female who presents for low back pain radiating bilaterally with L>R. Patient has history of back pain and presents with high muscle guarding of paraspinals and negative feedback loop. L UPA to L4 and L5 centralize pain and forward flexion reduces pain levels. 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 Patient will benefit from skilled physical therapy to reduce pain, improve mobility, and return to PLOF.    Personal Factors and Comorbidities Fitness;Past/Current Experience;Profession;Time since onset of injury/illness/exacerbation;Comorbidity 2    Comorbidities Heart murmur, and C section history    Examination-Activity Limitations Bed Mobility;Caring for Others;Carry;Dressing;Sleep;Sit;Reach Overhead;Locomotion Level;Lift;Squat;Stairs;Stand;Toileting;Transfers    Examination-Participation Restrictions Cleaning;Community Activity;Occupation;Meal  Prep;Laundry;Shop;Yard Work    [employer]chant [employer] officer Evolving/Moderate complexity    Clinical Decision Making Moderate    Rehab Potential Good    PT Frequency 2x / week    PT Duration 4 weeks    PT Treatment/Interventions Therapeutic exercise;Moist Heat;Electrical Stimulation;Cryotherapy;Ultrasound;Patient/family education;Manual techniques;Dry needling;ADLs/Self Care Home Management;Aquatic Therapy;Biofeedback;Traction;Gait training;Stair training;Functional mobility training;Therapeutic activities;Balance training;Neuromuscular re-education;Passive range of motion;Scar mobilization;Taping;Energy conservation;Spinal Manipulations;Joint Manipulations    PT Next Visit Plan review HEP, reduce paraspinal tension, joint mobs L    PT Home Exercise Plan see above    Recommended Other Services n/a    Consulted and Agree with Plan of Care Patient           Patient will benefit from skilled therapeutic intervention in order to improve the following deficits and impairments:  Decreased strength, Pain, Increased muscle spasms, Decreased range of motion, Improper body mechanics, Impaired flexibility, Abnormal gait, Decreased mobility, Difficulty walking, Hypomobility, Impaired perceived functional ability, Impaired sensation, Postural dysfunction  Visit Diagnosis: Chronic bilateral low back pain, unspecified whether sciatica present  Muscle weakness (generalized)     Problem List Patient Active Problem List   Diagnosis Date Noted  . Heart murmur 05/25/2020  . Low back pain 05/25/2020  . Encounter to establish care 11/25/2019  . Fatigue 11/25/2019  . Vitamin D deficiency 06/04/2017  . B12 deficiency 06/04/2017  . Elevated cholesterol with elevated triglycerides 06/04/2017   Janna Arch, PT, DPT   06/15/2020, 5:41 PM  [place] MAIN [facility] SERVICES [address] [place], [place], [address] Phone: [phone]   Fax:   [phone]  Name: Brooke Fox MRN: [id number] Date of Birth: Jul 30, 1991

## 2020-06-15 NOTE — Patient Instructions (Signed)
   Access Code: [email or web address] URL: [email or web address] Date: 06/15/2020 Prepared by: Janna Arch  Exercises Supine March with Posterior Pelvic Tilt - 1 x daily - 7 x weekly - 2 sets - 10 reps - 5 hold Standing Shoulder and Trunk Flexion at Table - 1 x daily - 7 x weekly - 2 sets - 2 reps - 10 hold Cat-Camel to Child's Pose - 1 x daily - 7 x weekly - 2 sets - 10 reps - 5 hold Seated Transversus Abdominis Bracing - 1 x daily - 7 x weekly - 2 sets - 10 reps - 5 hold

## 2020-06-17 ENCOUNTER — Ambulatory Visit: Payer: No Typology Code available for payment source

## 2020-06-20 ENCOUNTER — Ambulatory Visit: Payer: No Typology Code available for payment source

## 2020-06-22 ENCOUNTER — Other Ambulatory Visit: Payer: Self-pay

## 2020-06-22 ENCOUNTER — Ambulatory Visit: Payer: No Typology Code available for payment source

## 2020-06-22 DIAGNOSIS — M545 Low back pain, unspecified: Secondary | ICD-10-CM

## 2020-06-22 DIAGNOSIS — M6281 Muscle weakness (generalized): Secondary | ICD-10-CM

## 2020-06-22 DIAGNOSIS — G8929 Other chronic pain: Secondary | ICD-10-CM

## 2020-06-22 NOTE — Therapy (Signed)
[place] MAIN [facility] SERVICES [address] [place], [place], [address] Phone: [phone]   Fax:  [phone]  Physical Therapy Treatment  Patient Details  Name: Brooke Fox MRN: [id number] Date of Birth: 11/26/1990 Referring Provider (PT): Mable Paris    Encounter Date: 06/22/2020   PT End of Session - 06/23/20 1746    Visit Number 2    Number of Visits 8    Date for PT Re-Evaluation 07/13/20    Authorization Type 2/10 eval 9/15    PT Start Time 8115    PT Stop Time 1505    PT Time Calculation (min) 45 min    Activity Tolerance Patient tolerated treatment well;Patient limited by pain    Behavior During Therapy [facility] for tasks assessed/performed           Past Medical History:  Diagnosis Date  . GERD (gastroesophageal reflux disease)   . Heart murmur     Past Surgical History:  Procedure Laterality Date  . CESAREAN SECTION     2014    There were no vitals filed for this visit.   Subjective Assessment - 06/23/20 1743    Subjective Patient reports compliance with HEP,is coming from work. Radiating pain is not as frequent since evaluation.    Pertinent History Patient is a pleasant 29 year old female who presents to physical therapy for low back pain that hs worsened in past year and radiates bilaterally with L>R.  Patient has PMH of GERD, Heart murmur, and C section (2014). 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 Reports bending forward helps the pain, is in the process of trying for a second child and would like to resolve pain prior to pregnancy.    Limitations Sitting;Lifting;House hold activities;Standing;Other (comment);Walking    How long can you sit comfortably? 30 minutes total    How long can you stand  comfortably? better than sitting ; hour    How long can you walk comfortably? impacts back, hour - 2 hours.    Diagnostic tests MRI from 2017: 1. Mild annular disc bulge at L4-5 and L5-S1 with resultant minimal left foraminal narrowing; Mild degenerative disc bulge at T11-12 without stenosis;  Otherwise normal MRI of the lumbar spine. No other significant degenerative changes identified.    Patient Stated Goals walk and work without back pain and return to prior level of function    Currently in Pain? Yes    Pain Score 4     Pain Location Back    Pain Orientation Lower    Pain Descriptors / Indicators Aching;Radiating    Pain Type Acute pain    Pain Onset More than a month ago    Pain Frequency Constant               Treatment:  Supine: Hamstring lengthening with leg on PT shoulder 60 seconds each LE Sciatic nerve glide with leg on PT shoulder 20x each LE,  Figure four piriformis stretch 2x 60 seconds each LE SAD with leg in hooklying with belt; inferior glide 3x20 seconds each LE SAD with leg in figure four position with belt; inferior glide 3x 20 seconds each LE Posterior pelvic tilt 10x 3 second holds Posterior pelvic tilt march 10x each LE; cues for breathing for  core activation Posterior pelvic tilt adduction ball squeeze 10x 3 second holds cues for breathing  Prone: Grade II-III UPA L 4-5 3x 10 seconds for centralization of pain  STM to lumbar paraspinals, quadratus and surrounding musculature with implementation of effleurage and ptrissage for muscle tissue tension reduction and trigger point relief x 16 minutes   seated: postural education on core contraction and scapular retraction for reduction of tension.     Pt educated throughout session about proper posture and technique with exercises. Improved exercise technique, movement at target joints, use of target muscles after min to mod verbal, visual, tactile cues                     PT Education -  06/23/20 1745    Education provided Yes    Education Details exercise technique, body mechanics, manual    Person(s) Educated Patient    Methods Explanation;Demonstration;Tactile cues;Verbal cues    Comprehension Verbalized understanding;Returned demonstration;Verbal cues required;Tactile cues required            PT Short Term Goals - 06/15/20 1719      PT SHORT TERM GOAL #1   Title Patient will be independent in home exercise program to improve strength/mobility for better functional independence with ADLs.    Baseline 9/15: HEP given    Time 2    Period Weeks    Status New    Target Date 06/29/20      PT SHORT TERM GOAL #2   Title Patient will report a worst pain of 5/10 on VAS in  low back  to improve tolerance with ADLs and reduced symptoms with activities.    Baseline 9/15: 7/10    Time 2    Period Weeks    Status New    Target Date 06/29/20             PT Long Term Goals - 06/15/20 1721      PT LONG TERM GOAL #1   Title Patient will increase FOTO score to equal to or greater than 73%    to demonstrate statistically significant improvement in mobility and quality of life.    Baseline 9/15: 65%    Time 4    Period Weeks    Status New    Target Date 07/13/20      PT LONG TERM GOAL #2   Title Patient will report a worst pain of 3/10 on VAS in low back to improve tolerance with ADLs, work tasks of transfering patients, and reduced symptoms with activities.    Baseline 9/15: 7/10    Time 4    Period Weeks    Status New    Target Date 07/13/20      PT LONG TERM GOAL #3   Title Patient will be able to return to work task demands of squatting, transferring patients, and reaching with less than 2 point increase in VAS for improved quality of life.    Baseline 9/15: work tasks increase pain to 7/10    Time 4    Period Weeks    Status New    Target Date 07/13/20      PT LONG TERM GOAL #4   Title Patient will increase BLE gross strength to 4+/5 as to improve  functional strength for independent gait, increased standing tolerance and increased ADL ability.    Baseline 9/15: see note    Time 4    Period Weeks    Status New  Target Date 07/13/20                 Plan - 06/23/20 1750    Clinical Impression Statement Patient reports reduction of pain and stiffness by end of session. Multiple trigger points and muscle tension regions were found with L>R side with focused manual reducing symptoms. Patient introduced to core activation techniques with focus on gentle activation for stabilization and reduction of risk of injury. Patient will benefit from skilled physical therapy to reduce pain, improve mobility, and return to PLOF.    Personal Factors and Comorbidities Fitness;Past/Current Experience;Profession;Time since onset of injury/illness/exacerbation;Comorbidity 2    Comorbidities Heart murmur, and C section history    Examination-Activity Limitations Bed Mobility;Caring for Others;Carry;Dressing;Sleep;Sit;Reach Overhead;Locomotion Level;Lift;Squat;Stairs;Stand;Toileting;Transfers    Examination-Participation Restrictions Cleaning;Community Activity;Occupation;Meal Prep;Laundry;Shop;Yard Work    [employer]chant [employer] officer Evolving/Moderate complexity    Rehab Potential Good    PT Frequency 2x / week    PT Duration 4 weeks    PT Treatment/Interventions Therapeutic exercise;Moist Heat;Electrical Stimulation;Cryotherapy;Ultrasound;Patient/family education;Manual techniques;Dry needling;ADLs/Self Care Home Management;Aquatic Therapy;Biofeedback;Traction;Gait training;Stair training;Functional mobility training;Therapeutic activities;Balance training;Neuromuscular re-education;Passive range of motion;Scar mobilization;Taping;Energy conservation;Spinal Manipulations;Joint Manipulations    PT Next Visit Plan review HEP, reduce paraspinal tension, joint mobs L    PT Home Exercise Plan see above    Consulted and Agree with Plan of Care Patient            Patient will benefit from skilled therapeutic intervention in order to improve the following deficits and impairments:  Decreased strength, Pain, Increased muscle spasms, Decreased range of motion, Improper body mechanics, Impaired flexibility, Abnormal gait, Decreased mobility, Difficulty walking, Hypomobility, Impaired perceived functional ability, Impaired sensation, Postural dysfunction  Visit Diagnosis: Chronic bilateral low back pain, unspecified whether sciatica present  Muscle weakness (generalized)     Problem List Patient Active Problem List   Diagnosis Date Noted  . Heart murmur 05/25/2020  . Low back pain 05/25/2020  . Encounter to establish care 11/25/2019  . Fatigue 11/25/2019  . Vitamin D deficiency 06/04/2017  . B12 deficiency 06/04/2017  . Elevated cholesterol with elevated triglycerides 06/04/2017   Janna Arch, PT, DPT   06/23/2020, 5:51 PM  [place] MAIN [facility] SERVICES [address] [place], [place], [address] Phone: [phone]   Fax:  [phone]  Name: DIANARA SMULLEN MRN: [id number] Date of Birth: May 06, 1991

## 2020-06-23 ENCOUNTER — Ambulatory Visit
Admission: RE | Admit: 2020-06-23 | Discharge: 2020-06-23 | Disposition: A | Discharge status: Home / Self Care | Payer: No Typology Code available for payment source | Source: Ambulatory Visit | Attending: Family | Admitting: Family

## 2020-06-23 ENCOUNTER — Other Ambulatory Visit: Payer: Self-pay

## 2020-06-23 DIAGNOSIS — R011 Cardiac murmur, unspecified: Secondary | ICD-10-CM | POA: Diagnosis present

## 2020-06-23 LAB — ECHOCARDIOGRAM COMPLETE
AR max vel: 1.5 cm2
AV Area VTI: 1.6 cm2
AV Area mean vel: 1.53 cm2
AV Mean grad: 6.3 mmHg
AV Peak grad: 11 mmHg
Ao pk vel: 1.66 m/s
Area-P 1/2: 3.72 cm2
S' Lateral: 2.97 cm

## 2020-06-23 NOTE — Progress Notes (Signed)
*  PRELIMINARY RESULTS* Echocardiogram 2D Echocardiogram has been performed.  Brooke Fox 06/23/2020, 9:39 AM

## 2020-06-29 ENCOUNTER — Ambulatory Visit: Payer: No Typology Code available for payment source

## 2020-06-29 ENCOUNTER — Other Ambulatory Visit: Payer: Self-pay

## 2020-06-29 DIAGNOSIS — M545 Low back pain, unspecified: Secondary | ICD-10-CM

## 2020-06-29 DIAGNOSIS — M6281 Muscle weakness (generalized): Secondary | ICD-10-CM

## 2020-06-29 DIAGNOSIS — G8929 Other chronic pain: Secondary | ICD-10-CM

## 2020-06-29 NOTE — Therapy (Signed)
[place] MAIN [facility] SERVICES [address] [place], [place], [address] Phone: [phone]   Fax:  [phone]  Physical Therapy Treatment  Patient Details  Name: Brooke Fox MRN: [id number] Date of Birth: 1991/05/07 Referring Provider (PT): Mable Paris    Encounter Date: 06/29/2020   PT End of Session - 06/29/20 1421    Visit Number 3    Number of Visits 8    Date for PT Re-Evaluation 07/13/20    Authorization Type 3/10 eval 9/15    PT Start Time 1426    PT Stop Time 1510    PT Time Calculation (min) 44 min    Activity Tolerance Patient tolerated treatment well;Patient limited by pain    Behavior During Therapy [facility] for tasks assessed/performed           Past Medical History:  Diagnosis Date  . GERD (gastroesophageal reflux disease)   . Heart murmur     Past Surgical History:  Procedure Laterality Date  . CESAREAN SECTION     2014    There were no vitals filed for this visit.   Subjective Assessment - 06/29/20 1642    Subjective Patient reports her pain is improving in level and decreased radiation. Patient was able to help her son with his jujut[employer] practice.    Pertinent History Patient is a pleasant 29 year old female who presents to physical therapy for low back pain that hs worsened in past year and radiates bilaterally with L>R.  Patient has PMH of GERD, Heart murmur, and C section (2014). 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 Reports bending forward helps the pain, is in the process of trying for a second child and would like to resolve pain prior to pregnancy.    Limitations Sitting;Lifting;House hold activities;Standing;Other (comment);Walking    How long can you sit comfortably? 30 minutes total    How long  can you stand comfortably? better than sitting ; hour    How long can you walk comfortably? impacts back, hour - 2 hours.    Diagnostic tests MRI from 2017: 1. Mild annular disc bulge at L4-5 and L5-S1 with resultant minimal left foraminal narrowing; Mild degenerative disc bulge at T11-12 without stenosis;  Otherwise normal MRI of the lumbar spine. No other significant degenerative changes identified.    Patient Stated Goals walk and work without back pain and return to prior level of function    Currently in Pain? Yes    Pain Score 4     Pain Location Back    Pain Orientation Lower    Pain Descriptors / Indicators Aching    Pain Type Acute pain    Pain Onset More than a month ago    Pain Frequency Intermittent             Patient reports pain is better today 4-5/10     Treatment:  Supine: Hamstring lengthening with leg on PT shoulder 60 seconds each LE Sciatic nerve glide with leg on PT shoulder 20x each LE,  Figure four piriformis stretch 2x 60 seconds each LE SAD with leg in hooklying with belt; inferior glide 3x20 seconds each LE SAD with leg in figure four position with belt; inferior glide 3x 20 seconds each LE Posterior pelvic tilt  adduction ball squeeze 10x 3 second holds cues for breathing  supine bridging with ball between knees x 10 reps through partial ROM with controlled motion and TrA contraction TrA activation pressing into swiss ball with UE and LE 10x 3 second holds  Prone: Grade II-III UPA L 4-5 3x 10 seconds for centralization of pain STM to lumbar paraspinals, quadratus and surrounding musculature with implementation of effleurage and ptrissage for muscle tissue tension reduction and trigger point relief x 16 minutes     seated: postural education on core contraction and scapular retraction for reduction of tension.   swiss ball TrA contraction pressing with UE and LE 10x 3 second holds Swiss ball TrA contraction with LE LAQ 10x each LE Swiss ball TrA  contraction with marching pressing into ball with bilateral UE 10x each LE    Pt educated throughout session about proper posture and technique with exercises. Improved exercise technique, movement at target joints, use of target muscles after min to mod verbal, visual, tactile cues                     PT Education - 06/29/20 1421    Education provided Yes    Education Details exercise technique, body mechanics, manual    Person(s) Educated Patient    Methods Explanation;Demonstration;Tactile cues;Verbal cues    Comprehension Verbalized understanding;Returned demonstration;Verbal cues required;Tactile cues required            PT Short Term Goals - 06/15/20 1719      PT SHORT TERM GOAL #1   Title Patient will be independent in home exercise program to improve strength/mobility for better functional independence with ADLs.    Baseline 9/15: HEP given    Time 2    Period Weeks    Status New    Target Date 06/29/20      PT SHORT TERM GOAL #2   Title Patient will report a worst pain of 5/10 on VAS in  low back  to improve tolerance with ADLs and reduced symptoms with activities.    Baseline 9/15: 7/10    Time 2    Period Weeks    Status New    Target Date 06/29/20             PT Long Term Goals - 06/15/20 1721      PT LONG TERM GOAL #1   Title Patient will increase FOTO score to equal to or greater than 73%    to demonstrate statistically significant improvement in mobility and quality of life.    Baseline 9/15: 65%    Time 4    Period Weeks    Status New    Target Date 07/13/20      PT LONG TERM GOAL #2   Title Patient will report a worst pain of 3/10 on VAS in low back to improve tolerance with ADLs, work tasks of transfering patients, and reduced symptoms with activities.    Baseline 9/15: 7/10    Time 4    Period Weeks    Status New    Target Date 07/13/20      PT LONG TERM GOAL #3   Title Patient will be able to return to work task demands of  squatting, transferring patients, and reaching with less than 2 point increase in VAS for improved quality of life.    Baseline 9/15: work tasks increase pain to 7/10    Time 4    Period Weeks  Status New    Target Date 07/13/20      PT LONG TERM GOAL #4   Title Patient will increase BLE gross strength to 4+/5 as to improve functional strength for independent gait, increased standing tolerance and increased ADL ability.    Baseline 9/15: see note    Time 4    Period Weeks    Status New    Target Date 07/13/20                 Plan - 06/29/20 1652    Clinical Impression Statement Patient is progressing with decreased pain and improved tolerance of strengthening interventions. Progression of strengthening of core and gluteal activation tolerated well with swiss ball interventions added to HEP. Patient will benefit from skilled physical therapy to reduce pain, improve mobility, and return to PLOF.    Personal Factors and Comorbidities Fitness;Past/Current Experience;Profession;Time since onset of injury/illness/exacerbation;Comorbidity 2    Comorbidities Heart murmur, and C section history    Examination-Activity Limitations Bed Mobility;Caring for Others;Carry;Dressing;Sleep;Sit;Reach Overhead;Locomotion Level;Lift;Squat;Stairs;Stand;Toileting;Transfers    Examination-Participation Restrictions Cleaning;Community Activity;Occupation;Meal Prep;Laundry;Shop;Yard Work    [employer] officer Evolving/Moderate complexity    Rehab Potential Good    PT Frequency 2x / week    PT Duration 4 weeks    PT Treatment/Interventions Therapeutic exercise;Moist Heat;Electrical Stimulation;Cryotherapy;Ultrasound;Patient/family education;Manual techniques;Dry needling;ADLs/Self Care Home Management;Aquatic Therapy;Biofeedback;Traction;Gait training;Stair training;Functional mobility training;Therapeutic activities;Balance training;Neuromuscular re-education;Passive range of motion;Scar  mobilization;Taping;Energy conservation;Spinal Manipulations;Joint Manipulations    PT Next Visit Plan review HEP, reduce paraspinal tension, joint mobs L    PT Home Exercise Plan see above    Consulted and Agree with Plan of Care Patient           Patient will benefit from skilled therapeutic intervention in order to improve the following deficits and impairments:  Decreased strength, Pain, Increased muscle spasms, Decreased range of motion, Improper body mechanics, Impaired flexibility, Abnormal gait, Decreased mobility, Difficulty walking, Hypomobility, Impaired perceived functional ability, Impaired sensation, Postural dysfunction  Visit Diagnosis: Chronic bilateral low back pain, unspecified whether sciatica present  Muscle weakness (generalized)     Problem List Patient Active Problem List   Diagnosis Date Noted  . Heart murmur 05/25/2020  . Low back pain 05/25/2020  . Encounter to establish care 11/25/2019  . Fatigue 11/25/2019  . Vitamin D deficiency 06/04/2017  . B12 deficiency 06/04/2017  . Elevated cholesterol with elevated triglycerides 06/04/2017   Janna Arch, PT, DPT   06/29/2020, 4:57 PM  [place] MAIN [facility] SERVICES [address] [place], [place], [address] Phone: [phone]   Fax:  [phone]  Name: JUANITTA EARNHARDT MRN: [id number] Date of Birth: [birth year]

## 2020-07-07 ENCOUNTER — Ambulatory Visit: Payer: No Typology Code available for payment source

## 2020-07-22 ENCOUNTER — Other Ambulatory Visit: Payer: Self-pay | Admitting: Family

## 2020-10-05 ENCOUNTER — Ambulatory Visit: Payer: No Typology Code available for payment source | Admitting: Family

## 2020-10-06 ENCOUNTER — Encounter: Payer: Self-pay | Admitting: Family

## 2020-10-06 ENCOUNTER — Other Ambulatory Visit: Payer: Self-pay

## 2020-10-06 ENCOUNTER — Other Ambulatory Visit: Payer: Self-pay | Admitting: Family

## 2020-10-06 ENCOUNTER — Ambulatory Visit (INDEPENDENT_AMBULATORY_CARE_PROVIDER_SITE_OTHER): Payer: No Typology Code available for payment source | Admitting: Family

## 2020-10-06 VITALS — BP 112/80 | HR 81 | Temp 98.3°F | Ht 59.5 in | Wt 172.6 lb

## 2020-10-06 DIAGNOSIS — R519 Headache, unspecified: Secondary | ICD-10-CM

## 2020-10-06 MED ORDER — ONDANSETRON 4 MG PO TBDP: 4.0000 mg | ORAL_TABLET | Freq: Three times a day (TID) | ORAL | 0 refills | Status: DC | PRN

## 2020-10-06 MED ORDER — NAPROXEN SODIUM 550 MG PO TABS: ORAL_TABLET | ORAL | 0 refills | Status: DC

## 2020-10-06 NOTE — Patient Instructions (Addendum)
May use zofran, naprosyn ONLY when on your period as these medication are NOT safe during pregnancy.  Referral to neurology- Dr Lucia Gaskins Let us know if you dont hear back within a week in regards to an appointment being scheduled.

## 2020-10-06 NOTE — Assessment & Plan Note (Signed)
Reassured by  Normal neurologic exam and absence of red flags. Long discussion in regards to desire to conceive although not actively trying. She would like rescue therapy to take during menses ONLY as that is when HA is the worst. Counseled that naprosyn and zofran are teratogenic and she may NOT take if any concerns for pregnancy therefore she may only use while on menses. She verbalized understanding of all. Referral to dr Lucia Gaskins as well.

## 2020-10-06 NOTE — Progress Notes (Signed)
Subjective:    Patient ID: Brooke Fox, female    DOB: [birth year], 30 y.o.   MRN: [id number]  CC: Brooke Fox is a 30 y.o. female who presents today for follow up.   HPI: Complains of bilateral frontal to temporal HA x 2 weeks.  Associated with nausea, photosensitivity HA's more often now that she is off combined birth control 2 years ago due to desire to conceive. She is not 'trying' but also would welcome pregnancy.   HA do not awaken her from sleep. HA is not worse of life.  No vomiting, vision loss, numbness to face, confusion, sinus congestion.   Notices nausea before HA onset. No smell, vision changes prior to HA.   HA are worse right before period and while on period. However still has HA outside menses 1-3 per month.   Has HA today and menses started last night.  Drinks  Two 16 cups soda with caffeine .  Takes tylenol twice per week without relief.  Sleep has improved.  Would like to see Dr Jaynee Eagles, neurology H/o gestational HTN    HISTORY:  Past Medical History:  Diagnosis Date  . GERD (gastroesophageal reflux disease)   . Heart murmur    Past Surgical History:  Procedure Laterality Date  . CESAREAN SECTION     2014   Family History  Problem Relation Age of Onset  . Heart disease Father   . Cancer Maternal Aunt        breast  . Anemia Mother   . Osteoporosis Mother     Allergies: Patient has no known allergies. Current Outpatient Medications on File Prior to Visit  Medication Sig Dispense Refill  . cetirizine (ZYRTEC ALLERGY) 10 MG tablet Take 10 mg by mouth daily.    . Prenatal Vit-Fe Fumarate-FA (PRENATAL VITAMIN) 27-0.8 MG TABS Take 1 tablet by mouth daily.    . Vitamin D, Ergocalciferol, (DRISDOL) 1.25 MG (50000 UNIT) CAPS capsule TAKE 1 CAPSULE BY MOUTH EVERY 7 DAYS. 12 capsule 1   No current facility-administered medications on file prior to visit.    Social History   Tobacco Use  . Smoking status: Never Smoker  . Smokeless tobacco: Never  Used  Vaping Use  . Vaping Use: Never used  Substance Use Topics  . Alcohol use: Yes    Comment: ocassionally  . Drug use: No    Review of Systems  Constitutional: Negative for chills and fever.  Eyes: Negative for visual disturbance.  Respiratory: Negative for cough.   Cardiovascular: Negative for chest pain and palpitations.  Gastrointestinal: Negative for nausea and vomiting.  Neurological: Positive for headaches. Negative for dizziness and weakness.      Objective:    BP 112/80   Pulse 81   Temp 98.3 F (36.8 C)   Ht 4' 11.5" (1.511 m)   Wt 172 lb 9.6 oz (78.3 kg)   SpO2 99%   BMI 34.28 kg/m  BP Readings from Last 3 Encounters:  10/06/20 112/80  05/25/20 130/80  06/10/19 134/88   Wt Readings from Last 3 Encounters:  10/06/20 172 lb 9.6 oz (78.3 kg)  05/25/20 174 lb 9.6 oz (79.2 kg)  11/25/19 170 lb (77.1 kg)    Physical Exam Vitals reviewed.  Constitutional:      Appearance: She is well-developed and well-nourished.  HENT:     Mouth/Throat:     Mouth: Oropharynx is clear and moist and mucous membranes are normal.     Pharynx: Uvula midline.  Eyes:     Extraocular Movements: EOM normal.     Conjunctiva/sclera: Conjunctivae normal.     Pupils: Pupils are equal, round, and reactive to light.     Comments: Fundus normal bilaterally.   Cardiovascular:     Rate and Rhythm: Normal rate and regular rhythm.     Pulses: Normal pulses.     Heart sounds: Normal heart sounds.  Pulmonary:     Effort: Pulmonary effort is normal.     Breath sounds: Normal breath sounds. No wheezing, rhonchi or rales.  Skin:    General: Skin is warm and dry.  Neurological:     Mental Status: She is alert.     Cranial Nerves: No cranial nerve deficit.     Sensory: No sensory deficit.     Coordination: She displays a negative Romberg sign.     Deep Tendon Reflexes: Strength normal.     Reflex Scores:      Bicep reflexes are 2+ on the right side and 2+ on the left side.       Patellar reflexes are 2+ on the right side and 2+ on the left side.    Comments: Grip equal and strong bilateral upper extremities. Gait strong and steady. Able to perform rapid alternating movement without difficulty.   Psychiatric:        Mood and Affect: Mood and affect normal.        Speech: Speech normal.        Behavior: Behavior normal.        Thought Content: Thought content normal.        Assessment & Plan:   Problem List Items Addressed This Visit      Other   Headache - Primary    Reassured by  Normal neurologic exam and absence of red flags. Long discussion in regards to desire to conceive although not actively trying. She would like rescue therapy to take during menses ONLY as that is when HA is the worst. Counseled that naprosyn and zofran are teratogenic and she may NOT take if any concerns for pregnancy therefore she may only use while on menses. She verbalized understanding of all. Referral to dr Jaynee Eagles as well.       Relevant Medications   naproxen sodium (ANAPROX) 550 MG tablet   ondansetron (ZOFRAN ODT) 4 MG disintegrating tablet   Other Relevant Orders   Ambulatory referral to Neurology       I am having Brooke Fox start on naproxen sodium and ondansetron. I am also having her maintain her Vitamin D (Ergocalciferol), Prenatal Vitamin, and cetirizine.   Meds ordered this encounter  Medications  . naproxen sodium (ANAPROX) 550 MG tablet    Sig: Take at earliest onset of headache. May repeat dose in 12 hours if needed. Do not exceed two tablets in 24 hours. Take with food    Dispense:  30 tablet    Refill:  0    Order Specific Question:   Supervising Provider    Answer:   Deborra Medina L [[id number]]  . ondansetron (ZOFRAN ODT) 4 MG disintegrating tablet    Sig: Take 1 tablet (4 mg total) by mouth every 8 (eight) hours as needed for nausea or vomiting.    Dispense:  30 tablet    Refill:  0    Order Specific Question:   Supervising Provider    Answer:    Crecencio Mc [[id number]]    Return precautions given.   Risks, benefits,  and alternatives of the medications and treatment plan prescribed today were discussed, and patient expressed understanding.   Education regarding symptom management and diagnosis given to patient on AVS.  Continue to follow with Allegra Grana, FNP for routine health maintenance.   Orvil Feil and I agreed with plan.   Rennie Plowman, FNP

## 2020-10-24 ENCOUNTER — Ambulatory Visit: Payer: Self-pay | Admitting: Neurology

## 2020-10-26 ENCOUNTER — Encounter: Payer: Self-pay | Admitting: Family

## 2020-10-27 ENCOUNTER — Ambulatory Visit: Payer: No Typology Code available for payment source | Admitting: Neurology

## 2020-10-27 ENCOUNTER — Other Ambulatory Visit: Payer: Self-pay | Admitting: Family

## 2020-10-27 ENCOUNTER — Encounter: Payer: Self-pay | Admitting: Neurology

## 2020-10-27 VITALS — BP 127/79 | HR 73 | Ht 59.5 in | Wt 173.0 lb

## 2020-10-27 DIAGNOSIS — Z7689 Persons encountering health services in other specified circumstances: Secondary | ICD-10-CM

## 2020-10-27 DIAGNOSIS — G43709 Chronic migraine without aura, not intractable, without status migrainosus: Secondary | ICD-10-CM | POA: Diagnosis not present

## 2020-10-27 MED ORDER — RIZATRIPTAN BENZOATE 10 MG PO TBDP: 10.0000 mg | ORAL_TABLET | ORAL | 11 refills | Status: DC | PRN

## 2020-10-27 MED ORDER — PROPRANOLOL HCL ER 60 MG PO CP24: 60.0000 mg | ORAL_CAPSULE | Freq: Every day | ORAL | 11 refills | Status: DC

## 2020-10-27 NOTE — Progress Notes (Signed)
Botox consent signed  Botox- 200 units x 1 vial Lot: [id number] Expiration: 11/2022 NDC: 1751-0258-52  Bacteriostatic 0.9% Sodium Chloride- 63mL total Lot: [id number] Expiration: 11/01/2021 NDC: 3536-1443-15  sample

## 2020-10-27 NOTE — Patient Instructions (Signed)
Preventative: Botox for migraines. Propranolol at bedtime Acute: Rizatriptan: Please take one tablet at the onset of your headache. If it does not improve the symptoms please take one additional tablet. Do not take more then 2 tablets in 24hrs. Do not take use more then 2 to 3 times in a week.   OnabotulinumtoxinA injection (Medical Use) What is this medicine? ONABOTULINUMTOXINA (o na BOTT you lye num tox in eh) is a neuro-muscular blocker. This medicine is used to treat crossed eyes, eyelid spasms, severe neck muscle spasms, ankle and toe muscle spasms, and elbow, wrist, and finger muscle spasms. It is also used to treat excessive underarm sweating, to prevent chronic migraine headaches, and to treat loss of bladder control due to neurologic conditions such as multiple sclerosis or spinal cord injury. This medicine may be used for other purposes; ask your health care provider or pharmacist if you have questions. COMMON BRAND NAME(S): Botox What should I tell my health care provider before I take this medicine? They need to know if you have any of these conditions:  breathing problems  cerebral palsy spasms  difficulty urinating  heart problems  history of surgery where this medicine is going to be used  infection at the site where this medicine is going to be used  myasthenia gravis or other neurologic disease  nerve or muscle disease  surgery plans  take medicines that treat or prevent blood clots  thyroid problems  an unusual or allergic reaction to botulinum toxin, albumin, other medicines, foods, dyes, or preservatives  pregnant or trying to get pregnant  breast-feeding How should I use this medicine? This medicine is for injection into a muscle. It is given by a health care professional in a hospital or clinic setting. Talk to your pediatrician regarding the use of this medicine in children. While this drug may be prescribed for children as young as 15 years old for  selected conditions, precautions do apply. Overdosage: If you think you have taken too much of this medicine contact a poison control center or emergency room at once. NOTE: This medicine is only for you. Do not share this medicine with others. What if I miss a dose? This does not apply. What may interact with this medicine?  aminoglycoside antibiotics like gentamicin, neomycin, tobramycin  muscle relaxants  other botulinum toxin injections This list may not describe all possible interactions. Give your health care provider a list of all the medicines, herbs, non-prescription drugs, or dietary supplements you use. Also tell them if you smoke, drink alcohol, or use illegal drugs. Some items may interact with your medicine. What should I watch for while using this medicine? Visit your doctor for regular check ups. This medicine will cause weakness in the muscle where it is injected. Tell your doctor if you feel unusually weak in other muscles. Get medical help right away if you have problems with breathing, swallowing, or talking. This medicine might make your eyelids droop or make you see blurry or double. If you have weak muscles or trouble seeing do not drive a car, use machinery, or do other dangerous activities. This medicine contains albumin from human blood. It may be possible to pass an infection in this medicine, but no cases have been reported. Talk to your doctor about the risks and benefits of this medicine. If your activities have been limited by your condition, go back to your regular routine slowly after treatment with this medicine. What side effects may I notice from receiving this  medicine? Side effects that you should report to your doctor or health care professional as soon as possible:  allergic reactions like skin rash, itching or hives, swelling of the face, lips, or tongue  breathing problems  changes in vision  chest pain or tightness  eye irritation,  pain  fast, irregular heartbeat  infection  numbness  speech problems  swallowing problems  unusual weakness Side effects that usually do not require medical attention (report to your doctor or health care professional if they continue or are bothersome):  bruising or pain at site where injected  drooping eyelid  dry eyes or mouth  headache  muscles aches, pains  sensitivity to light  tearing This list may not describe all possible side effects. Call your doctor for medical advice about side effects. You may report side effects to FDA at 1-800-FDA-1088. Where should I keep my medicine? This drug is given in a hospital or clinic and will not be stored at home. NOTE: This sheet is a summary. It may not cover all possible information. If you have questions about this medicine, talk to your doctor, pharmacist, or health care provider.  2021 [employer] Standard (2018-03-24 14:21:42)   Propranolol Extended-Release Capsules What is this medicine? PROPRANOLOL (proe PRAN oh lole) is a beta blocker. It decreases the amount of work your heart has to do and helps your heart beat regularly. It treats high blood pressure and/or prevent chest pain (also called angina). This medicine may be used for other purposes such as migraines; ask your health care provider or pharmacist if you have questions. COMMON BRAND NAME(S): Inderal LA, Inderal XL, InnoPran XL What should I tell my health care provider before I take this medicine? They need to know if you have any of these conditions:  circulation problems, or blood vessel disease  diabetes  history of heart attack or heart disease, vasospastic angina  kidney disease  liver disease  lung or breathing disease, like asthma or emphysema  pheochromocytoma  slow heart rate  thyroid disease  an unusual or allergic reaction to propranolol, other beta-blockers, medicines, foods, dyes, or preservatives  pregnant or trying to get  pregnant  breast-feeding How should I use this medicine? Take this drug by mouth. Take it as directed on the prescription label at the same time every day. Do not cut, crush or chew this drug. Swallow the capsules whole. You can take it with or without food. If it upsets your stomach, take it with food. Keep taking it unless your health care provider tells you to stop. Talk to your health care provider about the use of this drug in children. Special care may be needed. Overdosage: If you think you have taken too much of this medicine contact a poison control center or emergency room at once. NOTE: This medicine is only for you. Do not share this medicine with others. What if I miss a dose? If you miss a dose, take it as soon as you can. If it is almost time for your next dose, take only that dose. Do not take double or extra doses. What may interact with this medicine? Do not take this medicine with any of the following medications:  feverfew  phenothiazines like chlorpromazine, mesoridazine, prochlorperazine, thioridazine This medicine may also interact with the following medications:  aluminum hydroxide gel  antipyrine  antiviral medicines for HIV or AIDS  barbiturates like phenobarbital  certain medicines for blood pressure, heart disease, irregular heart beat  cimetidine  ciprofloxacin  diazepam  fluconazole  haloperidol  isoniazid  medicines for cholesterol like cholestyramine or colestipol  medicines for mental depression  medicines for migraine headache like almotriptan, eletriptan, frovatriptan, naratriptan, rizatriptan, sumatriptan, zolmitriptan  NSAIDs, medicines for pain and inflammation, like ibuprofen or naproxen  phenytoin  rifampin  teniposide  theophylline  thyroid medicines  tolbutamide  warfarin  zileuton This list may not describe all possible interactions. Give your health care provider a list of all the medicines, herbs,  non-prescription drugs, or dietary supplements you use. Also tell them if you smoke, drink alcohol, or use illegal drugs. Some items may interact with your medicine. What should I watch for while using this medicine? Visit your doctor or health care professional for regular check ups. Contact your doctor right away if your symptoms worsen. Check your blood pressure and pulse rate regularly. Ask your health care professional what your blood pressure and pulse rate should be, and when you should contact them. Do not stop taking this medicine suddenly. This could lead to serious heart-related effects. You may get drowsy or dizzy. Do not drive, use machinery, or do anything that needs mental alertness until you know how this drug affects you. Do not stand or sit up quickly, especially if you are an older patient. This reduces the risk of dizzy or fainting spells. Alcohol can make you more drowsy and dizzy. Avoid alcoholic drinks. This medicine may increase blood sugar. Ask your healthcare provider if changes in diet or medicines are needed if you have diabetes. Do not treat yourself for coughs, colds, or pain while you are taking this medicine without asking your doctor or health care professional for advice. Some ingredients may increase your blood pressure. What side effects may I notice from receiving this medicine? Side effects that you should report to your doctor or health care professional as soon as possible:  allergic reactions like skin rash, itching or hives, swelling of the face, lips, or tongue  breathing problems  cold hands or feet  difficulty sleeping, nightmares  dry peeling skin  hallucinations  muscle cramps or weakness  signs and symptoms of high blood sugar such as being more thirsty or hungry or having to urinate more than normal. You may also feel very tired or have blurry vision.  slow heart rate  swelling of the legs and ankles  vomiting Side effects that usually  do not require medical attention (report to your doctor or health care professional if they continue or are bothersome):  change in sex drive or performance  diarrhea  dry sore eyes  hair loss  nausea  weak or tired This list may not describe all possible side effects. Call your doctor for medical advice about side effects. You may report side effects to FDA at 1-800-FDA-1088. Where should I keep my medicine? Keep out of the reach of children and pets. Store at room temperature between 15 and 30 degrees C (59 and 86 degrees F). Protect from light and moisture. Keep the container tightly closed. Avoid exposure to extreme heat. Do not freeze. Throw away any unused drug after the expiration date. NOTE: This sheet is a summary. It may not cover all possible information. If you have questions about this medicine, talk to your doctor, pharmacist, or health care provider.  2021 [employer]/Gold Standard (2019-04-27 16:23:26)

## 2020-10-27 NOTE — Progress Notes (Signed)
[facility]    Provider:  Dr Jaynee Eagles Requesting Provider: Burnard Hawthorne, FNP Primary Care Provider:  Burnard Hawthorne, FNP  CC:  Migraines  HPI:  Brooke Fox is a 30 y.o. female here as requested by Burnard Hawthorne, FNP for migraines.  I reviewed Dr. Harlan Stains notes: Patient complains of bilateral frontal to temporal headache that been ongoing for 2 weeks (last appointment October 06, 2020), headaches more often since she is off combined birth control, she stopped them 2 years ago due to her desire to conceive, she is not trying but would welcome pregnancy currently.  Headaches do not awaken her for sleep.  Headache is not worst of life.  No vomiting, vision loss, numbness to face, confusion, sinus congestion.  Notices nausea before the headache onset, no smell vision changes prior to headache.  Headaches are worse right before the.  And while on her period, however she still has headaches outside her menses 1 to 3/month, no significant caffeine, takes Tylenol without relief, sleep has improved, history of gestational hypertension.  Examination by Dr. Vidal Schwalbe was normal.  She was given naproxen, Zofran and rizatriptan and advised to take it during her periods as this is but patient would like to treat when the headaches are the worst.  Sept 2019 she came off birth control and they have been trying to conceive. She realized she was having more headaches, more frequent and more intense, she has nausea, dull headache, then throbbing and pulsating, can be behind the eyes, unilateral, light sensitive, sound sensitive,  movement makes it worse, laying in a dark room completely still helps. She has daily headaches. >16 migraine days a month that moderately severe to severe, 7 of the migraine days are severe during her menses and put her in bed and affect her life. Ongoing at this frequency and severity since 2019, she has examined her food, sleeps well, no aura, no medication  overuse,  She had pre-eclampsia. She had low amniotic fluid. No aura. No medication overuse. No other focal neurologic deficits, associated symptoms, inciting events or modifiable factors.  Medications tried in the past that can be used in migraine management: Medrol Dosepak, topiramate, amitriptyline, SSRIs, Zofran, naproxen, oxycodone, prednisone tablets, propranolol, rizatriptan, tramadol, Tylenol, over-the-counter NSAIDs and analgesics, cannot use Depakote as she is trying to get pregnant this is contraindicated can cause birth defects.  Reviewed notes, labs and imaging from outside physicians, which showed: see above  Review of Systems: Patient complains of symptoms per HPI as well as the following symptoms: migraines Pertinent negatives and positives per HPI. All others negative.   Social History   Socioeconomic History  . Marital status: Married    Spouse name: Not on file  . Number of children: 1  . Years of education: Not on file  . Highest education level: Not on file  Occupational History  . Not on file  Tobacco Use  . Smoking status: Never Smoker  . Smokeless tobacco: Never Used  Vaping Use  . Vaping Use: Never used  Substance and Sexual Activity  . Alcohol use: Yes    Comment: ocassionally  . Drug use: No  . Sexual activity: Yes    Birth control/protection: None  Other Topics Concern  . Not on file  Social History Narrative   Mom is patient is mine.   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 Heart disease Father   . Cancer Maternal Aunt         breast  . Anemia Mother   . Osteoporosis Mother   . Migraines Sister     Past Medical History:  Diagnosis Date  . GERD (gastroesophageal reflux disease)   . Heart murmur     Patient Active Problem List   Diagnosis Date Noted  . Chronic migraine without aura without status migrainosus, not intractable 10/30/2020  . Headache 10/06/2020  . Heart murmur 05/25/2020  . Low back pain 05/25/2020  . Encounter to establish care 11/25/2019  . Fatigue 11/25/2019  . Vitamin D deficiency 06/04/2017  . B12 deficiency 06/04/2017  . Elevated cholesterol with elevated triglycerides 06/04/2017    Past Surgical History:  Procedure Laterality Date  . CESAREAN SECTION     2014    Current Outpatient Medications  Medication Sig Dispense Refill  . cetirizine (ZYRTEC) 10 MG tablet Take 10 mg by mouth daily.    . naproxen sodium (ANAPROX) 550 MG tablet Take at earliest onset of headache. May repeat dose in 12 hours if needed. Do not exceed two tablets in 24 hours. Take with food 30 tablet 0  . ondansetron (ZOFRAN ODT) 4 MG disintegrating tablet Take 1 tablet (4 mg total) by mouth every 8 (eight) hours as needed for nausea or vomiting. 30 tablet 0  . Prenatal Vit-Fe Fumarate-FA (PRENATAL VITAMIN) 27-0.8 MG TABS Take 1 tablet by mouth daily.    . propranolol ER (INDERAL LA) 60 MG 24 hr capsule Take 1 capsule (60 mg total) by mouth at bedtime. 90 capsule 11  . rizatriptan (MAXALT-MLT) 10 MG disintegrating tablet Take 1 tablet (10 mg total) by mouth as needed for migraine. May repeat in 2 hours if needed 9 tablet 11  . Vitamin D, Ergocalciferol, (DRISDOL) 1.25 MG (50000 UNIT) CAPS capsule TAKE 1 CAPSULE BY MOUTH EVERY 7 DAYS. 12 capsule 1   No current facility-administered medications for this visit.    Allergies as of 10/27/2020  . (No Known Allergies)    Vitals: BP 127/79 (BP Location: Right Arm, Patient Position: Sitting)   Pulse 73   Ht 4' 11.5" (1.511 m)   Wt 173 lb (78.5 kg)   BMI 34.36  kg/m  Last Weight:  Wt Readings from Last 1 Encounters:  10/27/20 173 lb (78.5 kg)   Last Height:   Ht Readings from Last 1 Encounters:  10/27/20 4' 11.5" (1.511 m)     Physical exam: Exam: Gen: NAD, conversant, well nourised, obese, well groomed                     CV: RRR, no MRG. No Carotid Bruits. No peripheral edema, warm, nontender Eyes: Conjunctivae clear without exudates or hemorrhage  Neuro: Detailed Neurologic Exam  Speech:    Speech is normal; fluent and spontaneous with normal comprehension.  Cognition:    The patient is oriented to person, place, and time;     recent and remote memory intact;     language fluent;  normal attention, concentration,     fund of knowledge Cranial Nerves:    The pupils are equal, round, and reactive to light. The fundi are normal and spontaneous venous pulsations are present. Visual fields are full to finger confrontation. Extraocular movements are intact. Trigeminal sensation is intact and the muscles of mastication are normal. The face is symmetric. The palate elevates in the midline. Hearing intact. Voice is normal. Shoulder shrug is normal. The tongue has normal motion without fasciculations.   Coordination:    Normal finger to nose and heel to shin. Normal rapid alternating movements.   Gait:    Heel-toe and tandem gait are normal.   Motor Observation:    No asymmetry, no atrophy, and no involuntary movements noted. Tone:    Normal muscle tone.    Posture:    Posture is normal. normal erect    Strength:    Strength is V/V in the upper and lower limbs.      Sensation: intact to LT     Reflex Exam:  DTR's:    Deep tendon reflexes in the upper and lower extremities are normal bilaterally.   Toes:    The toes are downgoing bilaterally.   Clonus:    Clonus is absent.    Assessment/Plan: Lovely 30 year old with chronic migraines.  No red flags to indicate that we need imaging however she has tried and failed  multiple medications.  We discussed that no medication is safe in pregnancy, propranolol is thought to be compatible however it can cause low amniotic fluid and so she does get pregnant I would want her to follow-up with me immediately, stop the Maxalt, Botox is also thought to be compatible in pregnancy but we discussed the risks, we reviewed literature on possible complications.  Preventative: Botox for migraines. Propranolol at bedtime Acute: Rizatriptan: Please take one tablet at the onset of your headache. If it does not improve the symptoms please take one additional tablet. Do not take more then 2 tablets in 24hrs. Do not take use more then 2 to 3 times in a week. No orders of the defined types were placed in this encounter.  Meds ordered this encounter  Medications  . propranolol ER (INDERAL LA) 60 MG 24 hr capsule    Sig: Take 1 capsule (60 mg total) by mouth at bedtime.    Dispense:  90 capsule    Refill:  11  . rizatriptan (MAXALT-MLT) 10 MG disintegrating tablet    Sig: Take 1 tablet (10 mg total) by mouth as needed for migraine. May repeat in 2 hours if needed    Dispense:  9 tablet    Refill:  11   Consent Form Botulism Toxin Injection For Chronic Migraine    Reviewed orally with patient, additionally signature is on file:  Botulism toxin has been approved by the Federal drug administration for treatment of chronic migraine. Botulism toxin does not cure chronic migraine and it may not be effective in some patients.  The administration of botulism toxin is accomplished by injecting a small amount of toxin into the muscles of the neck and head. Dosage must be titrated for each individual. Any benefits resulting from botulism toxin tend to wear off after 3 months with a repeat injection required if benefit is to be maintained. Injections are usually done every 3-4 months with maximum effect peak achieved by about 2 or 3 weeks. Botulism toxin is expensive and you should be sure of  what costs you will incur  resulting from the injection.  The side effects of botulism toxin use for chronic migraine may include:   -Transient, and usually mild, facial weakness with facial injections  -Transient, and usually mild, head or neck weakness with head/neck injections  -Reduction or loss of forehead facial animation due to forehead muscle weakness  -Eyelid drooping  -Dry eye  -Pain at the site of injection or bruising at the site of injection  -Double vision  -Potential unknown long term risks  Contraindications: You should not have Botox if you are pregnant, nursing, allergic to albumin, have an infection, skin condition, or muscle weakness at the site of the injection, or have myasthenia gravis, Lambert-Eaton syndrome, or ALS.  It is also possible that as with any injection, there may be an allergic reaction or no effect from the medication. Reduced effectiveness after repeated injections is sometimes seen and rarely infection at the injection site may occur. All care will be taken to prevent these side effects. If therapy is given over a long time, atrophy and wasting in the muscle injected may occur. Occasionally the patient's become refractory to treatment because they develop antibodies to the toxin. In this event, therapy needs to be modified.  I have read the above information and consent to the administration of botulism toxin.    BOTOX PROCEDURE NOTE FOR MIGRAINE HEADACHE    Contraindications and precautions discussed with patient(above). Aseptic procedure was observed and patient tolerated procedure. Procedure performed by Dr. Georgia Dom  The condition has existed for more than 6 months, and pt does not have a diagnosis of ALS, Myasthenia Gravis or Lambert-Eaton Syndrome.  Risks and benefits of injections discussed and pt agrees to proceed with the procedure.  Written consent obtained  These injections are medically necessary. Pt  receives good benefits from these  injections. These injections do not cause sedations or hallucinations which the oral therapies may cause.  Description of procedure:  The patient was placed in a sitting position. The standard protocol was used for Botox as follows, with 5 units of Botox injected at each site:   -Procerus muscle, midline injection  -Corrugator muscle, bilateral injection  -Frontalis muscle, bilateral injection, with 2 sites each side, medial injection was performed in the upper one third of the frontalis muscle, in the region vertical from the medial inferior edge of the superior orbital rim. The lateral injection was again in the upper one third of the forehead vertically above the lateral limbus of the cornea, 1.5 cm lateral to the medial injection site.  -Temporalis muscle injection, 4 sites, bilaterally. The first injection was 3 cm above the tragus of the ear, second injection site was 1.5 cm to 3 cm up from the first injection site in line with the tragus of the ear. The third injection site was 1.5-3 cm forward between the first 2 injection sites. The fourth injection site was 1.5 cm posterior to the second injection site.   -Occipitalis muscle injection, 3 sites, bilaterally. The first injection was done one half way between the occipital protuberance and the tip of the mastoid process behind the ear. The second injection site was done lateral and superior to the first, 1 fingerbreadth from the first injection. The third injection site was 1 fingerbreadth superiorly and medially from the first injection site.  -Cervical paraspinal muscle injection, 2 sites, bilateral knee first injection site was 1 cm from the midline of the cervical spine, 3 cm inferior to the lower border of the occipital protuberance. The  second injection site was 1.5 cm superiorly and laterally to the first injection site.  -Trapezius muscle injection was performed at 3 sites, bilaterally. The first injection site was in the upper  trapezius muscle halfway between the inflection point of the neck, and the acromion. The second injection site was one half way between the acromion and the first injection site. The third injection was done between the first injection site and the inflection point of the neck.   Will return for repeat injection in 3 months.   200 units of Botox was used, any Botox not injected was wasted. The patient tolerated the procedure well, there were no complications of the above procedure.     Cc: Burnard Hawthorne, FNP,  Arnett, Yvetta Coder, FNP  Sarina Ill, MD  [facility] [address] [place] [employer] [facility], [place] [address]  Phone [phone] Fax [phone]  I spent over 65 minutes of face-to-face and non-face-to-face time with patient on the  1. Chronic migraine without aura without status migrainosus, not intractable    diagnosis.  This included previsit chart review, lab review, study review, order entry, electronic health record documentation, patient education on the different diagnostic and therapeutic options, counseling and coordination of care, risks and benefits of management, compliance, or risk factor reduction

## 2020-10-30 DIAGNOSIS — G43709 Chronic migraine without aura, not intractable, without status migrainosus: Secondary | ICD-10-CM | POA: Insufficient documentation

## 2020-11-01 ENCOUNTER — Telehealth: Payer: Self-pay | Admitting: Neurology

## 2020-11-01 NOTE — Telephone Encounter (Signed)
Received charge sheet for 200 units of Botox for G43.709. I called [employer] and spoke with Wille Glaser, who states [phone] will require PA but [phone] will not. PA for Botox is through [employer]. Joe transferred me to [facility] who was able to initiate PA. She states I will need to fax notes to them. Printed notes from patient's 1/27 visit and faxed to [employer].

## 2020-11-02 NOTE — Telephone Encounter (Signed)
Received approval from [facility] via fax. PA #[id number] (10/27/20- 09/30/21).

## 2020-12-15 ENCOUNTER — Ambulatory Visit: Payer: Self-pay | Admitting: Neurology

## 2020-12-15 ENCOUNTER — Encounter: Payer: Self-pay | Admitting: Family

## 2021-01-05 ENCOUNTER — Ambulatory Visit: Payer: No Typology Code available for payment source | Admitting: Neurology

## 2021-01-05 DIAGNOSIS — G43709 Chronic migraine without aura, not intractable, without status migrainosus: Secondary | ICD-10-CM

## 2021-01-05 NOTE — Progress Notes (Signed)
Botox- 200 units x 1 vial Lot: [id number] Expiration: 08/2023 NDC: 2482-5003-70  Bacteriostatic 0.9% Sodium Chloride- 45mL total Lot: [id number] Expiration: 11/01/2021 NDC: 6945-0388-82  Dx: C00.349 B/B

## 2021-01-05 NOTE — Progress Notes (Signed)
Consent Form Botulism Toxin Injection For Chronic Migraine   +a. She has a heavy brow so I did the frontalis higher up, may need to go even closer to the hairline. Put the extra in the occipital areas.   Reviewed orally with patient, additionally signature is on file:  Botulism toxin has been approved by the Federal drug administration for treatment of chronic migraine. Botulism toxin does not cure chronic migraine and it may not be effective in some patients.  The administration of botulism toxin is accomplished by injecting a small amount of toxin into the muscles of the neck and head. Dosage must be titrated for each individual. Any benefits resulting from botulism toxin tend to wear off after 3 months with a repeat injection required if benefit is to be maintained. Injections are usually done every 3-4 months with maximum effect peak achieved by about 2 or 3 weeks. Botulism toxin is expensive and you should be sure of what costs you will incur resulting from the injection.  The side effects of botulism toxin use for chronic migraine may include:   -Transient, and usually mild, facial weakness with facial injections  -Transient, and usually mild, head or neck weakness with head/neck injections  -Reduction or loss of forehead facial animation due to forehead muscle weakness  -Eyelid drooping  -Dry eye  -Pain at the site of injection or bruising at the site of injection  -Double vision  -Potential unknown long term risks  Contraindications: You should not have Botox if you are pregnant, nursing, allergic to albumin, have an infection, skin condition, or muscle weakness at the site of the injection, or have myasthenia gravis, Lambert-Eaton syndrome, or ALS.  It is also possible that as with any injection, there may be an allergic reaction or no effect from the medication. Reduced effectiveness after repeated injections is sometimes seen and rarely infection at the injection site may occur.  All care will be taken to prevent these side effects. If therapy is given over a long time, atrophy and wasting in the muscle injected may occur. Occasionally the patient's become refractory to treatment because they develop antibodies to the toxin. In this event, therapy needs to be modified.  I have read the above information and consent to the administration of botulism toxin.    BOTOX PROCEDURE NOTE FOR MIGRAINE HEADACHE    Contraindications and precautions discussed with patient(above). Aseptic procedure was observed and patient tolerated procedure. Procedure performed by Dr. Georgia Dom  The condition has existed for more than 6 months, and pt does not have a diagnosis of ALS, Myasthenia Gravis or Lambert-Eaton Syndrome.  Risks and benefits of injections discussed and pt agrees to proceed with the procedure.  Written consent obtained  These injections are medically necessary. Pt  receives good benefits from these injections. These injections do not cause sedations or hallucinations which the oral therapies may cause.  Description of procedure:  The patient was placed in a sitting position. The standard protocol was used for Botox as follows, with 5 units of Botox injected at each site:   -Procerus muscle, midline injection  -Corrugator muscle, bilateral injection  -Frontalis muscle, bilateral injection, with 2 sites each side, medial injection was performed in the upper one third of the frontalis muscle, in the region vertical from the medial inferior edge of the superior orbital rim. The lateral injection was again in the upper one third of the forehead vertically above the lateral limbus of the cornea, 1.5 cm lateral to the  medial injection site.  -Temporalis muscle injection, 4 sites, bilaterally. The first injection was 3 cm above the tragus of the ear, second injection site was 1.5 cm to 3 cm up from the first injection site in line with the tragus of the ear. The third injection  site was 1.5-3 cm forward between the first 2 injection sites. The fourth injection site was 1.5 cm posterior to the second injection site.   -Occipitalis muscle injection, 3 sites, bilaterally. The first injection was done one half way between the occipital protuberance and the tip of the mastoid process behind the ear. The second injection site was done lateral and superior to the first, 1 fingerbreadth from the first injection. The third injection site was 1 fingerbreadth superiorly and medially from the first injection site.  -Cervical paraspinal muscle injection, 2 sites, bilateral knee first injection site was 1 cm from the midline of the cervical spine, 3 cm inferior to the lower border of the occipital protuberance. The second injection site was 1.5 cm superiorly and laterally to the first injection site.  -Trapezius muscle injection was performed at 3 sites, bilaterally. The first injection site was in the upper trapezius muscle halfway between the inflection point of the neck, and the acromion. The second injection site was one half way between the acromion and the first injection site. The third injection was done between the first injection site and the inflection point of the neck.   Will return for repeat injection in 3 months.   200 units of Botox was used, any Botox not injected was wasted. The patient tolerated the procedure well, there were no complications of the above procedure.

## 2021-01-10 ENCOUNTER — Telehealth: Payer: No Typology Code available for payment source | Admitting: Nurse Practitioner

## 2021-01-10 DIAGNOSIS — R059 Cough, unspecified: Secondary | ICD-10-CM

## 2021-01-10 MED ORDER — BENZONATATE 100 MG PO CAPS
100.0000 mg | ORAL_CAPSULE | Freq: Three times a day (TID) | ORAL | 0 refills | Status: DC | PRN
  Filled 2021-01-10: qty 20, 7d supply, fill #0

## 2021-01-10 MED ORDER — AZITHROMYCIN 250 MG PO TABS
ORAL_TABLET | ORAL | 0 refills | Status: DC
  Filled 2021-01-10: qty 6, 5d supply, fill #0

## 2021-01-10 NOTE — Progress Notes (Signed)
We are sorry that you are not feeling well.  Here is how we plan to help!  Based on your presentation I believe you most likely have A cough due to bacteria.  When patients have a fever and a productive cough with a change in color or increased sputum production, we are concerned about bacterial bronchitis.  If left untreated it can progress to pneumonia.  If your symptoms do not improve with your treatment plan it is important that you contact your provider.   I have prescribed Azithromyin 250 mg: two tablets now and then one tablet daily for 4 additonal days    In addition you may use A prescription cough medication called Tessalon Perles 100mg . You may take 1-2 capsules every 8 hours as needed for your cough.   From your responses in the eVisit questionnaire you describe inflammation in the upper respiratory tract which is causing a significant cough.  This is commonly called Bronchitis and has four common causes:    Allergies  Viral Infections  Acid Reflux  Bacterial Infection Allergies, viruses and acid reflux are treated by controlling symptoms or eliminating the cause. An example might be a cough caused by taking certain blood pressure medications. You stop the cough by changing the medication. Another example might be a cough caused by acid reflux. Controlling the reflux helps control the cough.  USE OF BRONCHODILATOR ("RESCUE") INHALERS: There is a risk from using your bronchodilator too frequently.  The risk is that over-reliance on a medication which only relaxes the muscles surrounding the breathing tubes can reduce the effectiveness of medications prescribed to reduce swelling and congestion of the tubes themselves.  Although you feel brief relief from the bronchodilator inhaler, your asthma may actually be worsening with the tubes becoming more swollen and filled with mucus.  This can delay other crucial treatments, such as oral steroid medications. If you need to use a  bronchodilator inhaler daily, several times per day, you should discuss this with your provider.  There are probably better treatments that could be used to keep your asthma under control.     HOME CARE . Only take medications as instructed by your medical team. . Complete the entire course of an antibiotic. . Drink plenty of fluids and get plenty of rest. . Avoid close contacts especially the very young and the elderly . Cover your mouth if you cough or cough into your sleeve. . Always remember to wash your hands . A steam or ultrasonic humidifier can help congestion.   GET HELP RIGHT AWAY IF: . You develop worsening fever. . You become short of breath . You cough up blood. . Your symptoms persist after you have completed your treatment plan MAKE SURE YOU   Understand these instructions.  Will watch your condition.  Will get help right away if you are not doing well or get worse.  Your e-visit answers were reviewed by a board certified advanced clinical practitioner to complete your personal care plan.  Depending on the condition, your plan could have included both over the counter or prescription medications. If there is a problem please reply  once you have received a response from your provider. Your safety is important to [place].  If you have drug allergies check your prescription carefully.    You can use MyChart to ask questions about today's visit, request a non-urgent call back, or ask for a work or school excuse for 24 hours related to this e-Visit. If it has been  greater than 24 hours you will need to follow up with your provider, or enter a new e-Visit to address those concerns. You will get an e-mail in the next two days asking about your experience.  I hope that your e-visit has been valuable and will speed your recovery. Thank you for using e-visits.  5-10 minutes spent reviewing and documenting in chart.

## 2021-01-11 ENCOUNTER — Other Ambulatory Visit: Payer: Self-pay

## 2021-01-16 ENCOUNTER — Other Ambulatory Visit: Payer: Self-pay

## 2021-01-16 ENCOUNTER — Other Ambulatory Visit: Payer: Self-pay | Admitting: Family

## 2021-02-07 ENCOUNTER — Other Ambulatory Visit: Payer: Self-pay | Admitting: Family

## 2021-02-07 ENCOUNTER — Other Ambulatory Visit: Payer: Self-pay

## 2021-02-07 MED ORDER — VITAMIN D (ERGOCALCIFEROL) 1.25 MG (50000 UNIT) PO CAPS
ORAL_CAPSULE | ORAL | 1 refills | Status: AC
  Filled 2021-02-07 – 2021-05-01 (×2): qty 12, 84d supply, fill #0
  Filled 2021-09-13: qty 12, 84d supply, fill #1

## 2021-02-17 ENCOUNTER — Other Ambulatory Visit: Payer: Self-pay

## 2021-04-06 ENCOUNTER — Ambulatory Visit: Payer: No Typology Code available for payment source | Admitting: Neurology

## 2021-04-06 ENCOUNTER — Other Ambulatory Visit: Payer: Self-pay

## 2021-04-06 DIAGNOSIS — G43709 Chronic migraine without aura, not intractable, without status migrainosus: Secondary | ICD-10-CM | POA: Diagnosis not present

## 2021-04-06 DIAGNOSIS — R519 Headache, unspecified: Secondary | ICD-10-CM

## 2021-04-06 DIAGNOSIS — G4719 Other hypersomnia: Secondary | ICD-10-CM

## 2021-04-06 DIAGNOSIS — R0683 Snoring: Secondary | ICD-10-CM

## 2021-04-06 NOTE — Progress Notes (Signed)
Botox- 200 units x 1 vial Lot: [id number] Expiration: 10/2023 NDC: 3005-1102-11   Bacteriostatic 0.9% Sodium Chloride- 5mL total Lot:[id number] Expiration: 10/2023 NDC: 1735-6701-41   B/B

## 2021-04-06 NOTE — Progress Notes (Signed)
Consent Form Botulism Toxin Injection For Chronic Migraine  April 06, 2021: Tremendous improvement, patient is extremely thrilled, greater than 90% improvement in migraine frequency and severity.  Today she did talk to me about her sleeping problems, she is very fatigued, snoring, morning headaches, referred her to sleep studies.  +a. She has a heavy brow so I did the frontalis higher up, . Put the extra in the occipital areas.   Reviewed orally with patient, additionally signature is on file:  Botulism toxin has been approved by the Federal drug administration for treatment of chronic migraine. Botulism toxin does not cure chronic migraine and it may not be effective in some patients.  The administration of botulism toxin is accomplished by injecting a small amount of toxin into the muscles of the neck and head. Dosage must be titrated for each individual. Any benefits resulting from botulism toxin tend to wear off after 3 months with a repeat injection required if benefit is to be maintained. Injections are usually done every 3-4 months with maximum effect peak achieved by about 2 or 3 weeks. Botulism toxin is expensive and you should be sure of what costs you will incur resulting from the injection.  The side effects of botulism toxin use for chronic migraine may include:   -Transient, and usually mild, facial weakness with facial injections  -Transient, and usually mild, head or neck weakness with head/neck injections  -Reduction or loss of forehead facial animation due to forehead muscle weakness  -Eyelid drooping  -Dry eye  -Pain at the site of injection or bruising at the site of injection  -Double vision  -Potential unknown long term risks  Contraindications: You should not have Botox if you are pregnant, nursing, allergic to albumin, have an infection, skin condition, or muscle weakness at the site of the injection, or have myasthenia gravis, Lambert-Eaton syndrome, or ALS.  It is  also possible that as with any injection, there may be an allergic reaction or no effect from the medication. Reduced effectiveness after repeated injections is sometimes seen and rarely infection at the injection site may occur. All care will be taken to prevent these side effects. If therapy is given over a long time, atrophy and wasting in the muscle injected may occur. Occasionally the patient's become refractory to treatment because they develop antibodies to the toxin. In this event, therapy needs to be modified.  I have read the above information and consent to the administration of botulism toxin.    BOTOX PROCEDURE NOTE FOR MIGRAINE HEADACHE    Contraindications and precautions discussed with patient(above). Aseptic procedure was observed and patient tolerated procedure. Procedure performed by Dr. Georgia Dom  The condition has existed for more than 6 months, and pt does not have a diagnosis of ALS, Myasthenia Gravis or Lambert-Eaton Syndrome.  Risks and benefits of injections discussed and pt agrees to proceed with the procedure.  Written consent obtained  These injections are medically necessary. Pt  receives good benefits from these injections. These injections do not cause sedations or hallucinations which the oral therapies may cause.  Description of procedure:  The patient was placed in a sitting position. The standard protocol was used for Botox as follows, with 5 units of Botox injected at each site:   -Procerus muscle, midline injection  -Corrugator muscle, bilateral injection  -Frontalis muscle, bilateral injection, with 2 sites each side, medial injection was performed in the upper one third of the frontalis muscle, in the region vertical from the medial  inferior edge of the superior orbital rim. The lateral injection was again in the upper one third of the forehead vertically above the lateral limbus of the cornea, 1.5 cm lateral to the medial injection  site.  -Temporalis muscle injection, 4 sites, bilaterally. The first injection was 3 cm above the tragus of the ear, second injection site was 1.5 cm to 3 cm up from the first injection site in line with the tragus of the ear. The third injection site was 1.5-3 cm forward between the first 2 injection sites. The fourth injection site was 1.5 cm posterior to the second injection site.   -Occipitalis muscle injection, 3 sites, bilaterally. The first injection was done one half way between the occipital protuberance and the tip of the mastoid process behind the ear. The second injection site was done lateral and superior to the first, 1 fingerbreadth from the first injection. The third injection site was 1 fingerbreadth superiorly and medially from the first injection site.  -Cervical paraspinal muscle injection, 2 sites, bilateral knee first injection site was 1 cm from the midline of the cervical spine, 3 cm inferior to the lower border of the occipital protuberance. The second injection site was 1.5 cm superiorly and laterally to the first injection site.  -Trapezius muscle injection was performed at 3 sites, bilaterally. The first injection site was in the upper trapezius muscle halfway between the inflection point of the neck, and the acromion. The second injection site was one half way between the acromion and the first injection site. The third injection was done between the first injection site and the inflection point of the neck.   Will return for repeat injection in 3 months.   200 units of Botox was used, any Botox not injected was wasted. The patient tolerated the procedure well, there were no complications of the above procedure.

## 2021-04-07 ENCOUNTER — Encounter: Payer: Self-pay | Admitting: Family

## 2021-04-10 ENCOUNTER — Other Ambulatory Visit: Payer: Self-pay | Admitting: Family

## 2021-04-10 DIAGNOSIS — R5383 Other fatigue: Secondary | ICD-10-CM

## 2021-04-13 ENCOUNTER — Ambulatory Visit: Payer: No Typology Code available for payment source | Admitting: Dermatology

## 2021-04-26 ENCOUNTER — Ambulatory Visit: Payer: No Typology Code available for payment source | Admitting: Dermatology

## 2021-05-01 ENCOUNTER — Other Ambulatory Visit: Payer: Self-pay

## 2021-05-01 MED FILL — Propranolol HCl Cap ER 24HR 60 MG: ORAL | 90 days supply | Qty: 90 | Fill #0 | Status: AC

## 2021-05-03 ENCOUNTER — Ambulatory Visit (INDEPENDENT_AMBULATORY_CARE_PROVIDER_SITE_OTHER): Payer: No Typology Code available for payment source | Admitting: Family

## 2021-05-03 ENCOUNTER — Other Ambulatory Visit: Payer: Self-pay

## 2021-05-03 ENCOUNTER — Encounter: Payer: Self-pay | Admitting: Family

## 2021-05-03 VITALS — BP 110/68 | HR 62 | Temp 98.4°F | Ht 59.5 in | Wt 179.6 lb

## 2021-05-03 DIAGNOSIS — E559 Vitamin D deficiency, unspecified: Secondary | ICD-10-CM | POA: Diagnosis not present

## 2021-05-03 DIAGNOSIS — G43709 Chronic migraine without aura, not intractable, without status migrainosus: Secondary | ICD-10-CM

## 2021-05-03 DIAGNOSIS — E782 Mixed hyperlipidemia: Secondary | ICD-10-CM

## 2021-05-03 DIAGNOSIS — R5383 Other fatigue: Secondary | ICD-10-CM | POA: Diagnosis not present

## 2021-05-03 DIAGNOSIS — N62 Hypertrophy of breast: Secondary | ICD-10-CM | POA: Diagnosis not present

## 2021-05-03 NOTE — Assessment & Plan Note (Signed)
Chronic, unchanged.  Pending labs for metabolic reasons for fatigue.  Encouraged regular exercise.  Will follow as patient sees neurology for evaluation for underlying sleep apnea.  Advised her to come back for f/u if OSA evaluation is normal.

## 2021-05-03 NOTE — Progress Notes (Signed)
Subjective:    Patient ID: Brooke Fox, female    DOB: 09/26/1991, 30 y.o.   MRN: [id number]  CC: Brooke Fox is a 30 y.o. female who presents today for follow up.   HPI: Feels well today, no new complaints.  She continues to endorse fatigue.  Describes this is daily and for many years.  This symptom has not changed.  sleep is not restorative.  She has upcoming appointment with Dr. Leida Fox, neurology for evaluation of sleep apnea.  Here today here today primarily to discuss breast reduction surgery and referral. She would like labs done as well.  She has wanted breast reduction since 30 years old. She has upper and lower back pain for years. She has indentations from bra straps. She has to wear deodorant under both breasts to prevent rashes.  She would like to see Dr Brooke Fox.   following with Dr. Jaynee Fox for migraines.  Reports migraines are much improved.  She is compliant with propanolol and follows with Dr. Jaynee Fox for Botox injections.  Very rare use of Imitrex   HISTORY:  Past Medical History:  Diagnosis Date   GERD (gastroesophageal reflux disease)    Heart murmur    Past Surgical History:  Procedure Laterality Date   CESAREAN SECTION     2014   Family History  Problem Relation Age of Onset   Heart disease Father    Cancer Maternal Aunt        breast   Anemia Mother    Osteoporosis Mother    Migraines Sister     Allergies: Patient has no known allergies. Current Outpatient Medications on File Prior to Visit  Medication Sig Dispense Refill   cetirizine (ZYRTEC) 10 MG tablet Take 10 mg by mouth daily.     naproxen sodium (ANAPROX) 550 MG tablet TAKE AT EARLIEST ONSET OF HEADACHE. MAY REPEAT DOSE IN 12 HOURS IF NEEDED. DO NOT EXCEED 2 TABLETS IN 24 HOURS. TAKE WITH FOOD. 30 tablet 0   ondansetron (ZOFRAN-ODT) 4 MG disintegrating tablet DISSOLVE 1 TABLET BY MOUTH EVERY 8 HOURS AS NEEDED FOR NAUSEA OR VOMITING. 30 tablet 0   propranolol ER (INDERAL LA) 60 MG 24 hr  capsule TAKE 1 CAPSULE BY MOUTH AT BEDTIME. 90 capsule 11   rizatriptan (MAXALT-MLT) 10 MG disintegrating tablet TAKE 1 TABLET BY MOUTH AS NEEDED FOR MIGRAINE. MAY REPEAT IN 2 HOURS IF NEEDED 9 tablet 11   Vitamin D, Ergocalciferol, (DRISDOL) 1.25 MG (50000 UNIT) CAPS capsule TAKE 1 CAPSULE BY MOUTH EVERY 7 DAYS. 12 capsule 1   No current facility-administered medications on file prior to visit.    Social History   Tobacco Use   Smoking status: Never   Smokeless tobacco: Never  Vaping Use   Vaping Use: Never used  Substance Use Topics   Alcohol use: Yes    Comment: ocassionally   Drug use: No    Review of Systems  Constitutional:  Positive for fatigue. Negative for chills and fever.  Respiratory:  Negative for cough.   Cardiovascular:  Negative for chest pain and palpitations.  Gastrointestinal:  Negative for nausea and vomiting.     Objective:    BP 110/68 (BP Location: Left Arm, Patient Position: Sitting, Cuff Size: Large)   Pulse 62   Temp 98.4 F (36.9 C) (Oral)   Ht 4' 11.5" (1.511 m)   Wt 179 lb 9.6 oz (81.5 kg)   SpO2 99%   BMI 35.67 kg/m  BP Readings from Last  3 Encounters:  05/03/21 110/68  10/27/20 127/79  10/06/20 112/80   Wt Readings from Last 3 Encounters:  05/03/21 179 lb 9.6 oz (81.5 kg)  10/27/20 173 lb (78.5 kg)  10/06/20 172 lb 9.6 oz (78.3 kg)    Physical Exam Vitals reviewed.  Constitutional:      Appearance: She is well-developed.  Eyes:     Conjunctiva/sclera: Conjunctivae normal.  Cardiovascular:     Rate and Rhythm: Normal rate and regular rhythm.     Pulses: Normal pulses.     Heart sounds: Normal heart sounds.  Pulmonary:     Effort: Pulmonary effort is normal.     Breath sounds: Normal breath sounds. No wheezing, rhonchi or rales.  Skin:    General: Skin is warm and dry.  Neurological:     Mental Status: She is alert.  Psychiatric:        Speech: Speech normal.        Behavior: Behavior normal.        Thought Content:  Thought content normal.       Assessment & Plan:   Problem List Items Addressed This Visit       Cardiovascular and Mediastinum   Chronic migraine without aura without status migrainosus, not intractable    Very well controlled.  She will continue following with neurology with regimen as prescribed.         Other   Elevated cholesterol with elevated triglycerides   Relevant Orders   Lipid panel   Fatigue - Primary    Chronic, unchanged.  Pending labs for metabolic reasons for fatigue.  Encouraged regular exercise.  Will follow as patient sees neurology for evaluation for underlying sleep apnea.  Advised her to come back for f/u if OSA evaluation is normal.       Relevant Orders   Hemoglobin A1c   TSH   CBC with Differential/Platelet   Comprehensive metabolic panel   Vitamin D (25 hydroxy)   B12   Vitamin D deficiency   Relevant Orders   Vitamin D (25 hydroxy)   Other Visit Diagnoses     Large breasts       Relevant Orders   Ambulatory referral to General Surgery        I have discontinued Brooke Fox's Prenatal Vitamin. I am also having her maintain her cetirizine, rizatriptan, propranolol ER, ondansetron, naproxen sodium, and Vitamin D (Ergocalciferol).   No orders of the defined types were placed in this encounter.   Return precautions given.   Risks, benefits, and alternatives of the medications and treatment plan prescribed today were discussed, and patient expressed understanding.   Education regarding symptom management and diagnosis given to patient on AVS.  Continue to follow with Brooke Hawthorne, FNP for routine health maintenance.   Brooke Fox and I agreed with plan.   Brooke Paris, FNP

## 2021-05-03 NOTE — Assessment & Plan Note (Signed)
Very well controlled.  She will continue following with neurology with regimen as prescribed.

## 2021-05-17 ENCOUNTER — Other Ambulatory Visit: Payer: Self-pay

## 2021-05-17 ENCOUNTER — Encounter: Payer: Self-pay | Admitting: Family

## 2021-05-17 ENCOUNTER — Other Ambulatory Visit (INDEPENDENT_AMBULATORY_CARE_PROVIDER_SITE_OTHER): Payer: No Typology Code available for payment source

## 2021-05-17 ENCOUNTER — Other Ambulatory Visit: Payer: Self-pay | Admitting: Family

## 2021-05-17 ENCOUNTER — Ambulatory Visit: Payer: No Typology Code available for payment source | Admitting: Dermatology

## 2021-05-17 DIAGNOSIS — L700 Acne vulgaris: Secondary | ICD-10-CM

## 2021-05-17 DIAGNOSIS — E559 Vitamin D deficiency, unspecified: Secondary | ICD-10-CM | POA: Diagnosis not present

## 2021-05-17 DIAGNOSIS — R5383 Other fatigue: Secondary | ICD-10-CM

## 2021-05-17 DIAGNOSIS — E782 Mixed hyperlipidemia: Secondary | ICD-10-CM | POA: Diagnosis not present

## 2021-05-17 LAB — COMPREHENSIVE METABOLIC PANEL
ALT: 21 U/L (ref 0–35)
AST: 15 U/L (ref 0–37)
Albumin: 4.3 g/dL (ref 3.5–5.2)
Alkaline Phosphatase: 72 U/L (ref 39–117)
BUN: 12 mg/dL (ref 6–23)
CO2: 26 mEq/L (ref 19–32)
Calcium: 9.3 mg/dL (ref 8.4–10.5)
Chloride: 105 mEq/L (ref 96–112)
Creatinine, Ser: 0.76 mg/dL (ref 0.40–1.20)
GFR: 103.61 mL/min (ref >60.00)
Glucose, Bld: 87 mg/dL (ref 70–99)
Potassium: 4.5 mEq/L (ref 3.5–5.1)
Sodium: 138 mEq/L (ref 135–145)
Total Bilirubin: 0.7 mg/dL (ref 0.2–1.2)
Total Protein: 6.9 g/dL (ref 6.0–8.3)

## 2021-05-17 LAB — CBC WITH DIFFERENTIAL/PLATELET
Basophils Absolute: 0 10*3/uL (ref 0.0–0.1)
Basophils Relative: 0.5 % (ref 0.0–3.0)
Eosinophils Absolute: 0.4 10*3/uL (ref 0.0–0.7)
Eosinophils Relative: 4.5 % (ref 0.0–5.0)
HCT: 34.4 % — ABNORMAL LOW (ref 36.0–46.0)
Hemoglobin: 12.1 g/dL (ref 12.0–15.0)
Lymphocytes Relative: 20.5 % (ref 12.0–46.0)
Lymphs Abs: 1.9 10*3/uL (ref 0.7–4.0)
MCHC: 35.2 g/dL (ref 30.0–36.0)
MCV: 94.8 fl (ref 78.0–100.0)
Monocytes Absolute: 0.5 10*3/uL (ref 0.1–1.0)
Monocytes Relative: 5.9 % (ref 3.0–12.0)
Neutro Abs: 6.3 10*3/uL (ref 1.4–7.7)
Neutrophils Relative %: 68.6 % (ref 43.0–77.0)
Platelets: 277 10*3/uL (ref 150.0–400.0)
RBC: 3.63 Mil/uL — ABNORMAL LOW (ref 3.87–5.11)
RDW: 12.4 % (ref 11.5–15.5)
WBC: 9.1 10*3/uL (ref 4.0–10.5)

## 2021-05-17 LAB — LIPID PANEL
Cholesterol: 198 mg/dL (ref 0–200)
HDL: 48 mg/dL (ref >39.00)
LDL Cholesterol: 132 mg/dL — ABNORMAL HIGH (ref 0–99)
NonHDL: 150.2
Total CHOL/HDL Ratio: 4
Triglycerides: 92 mg/dL (ref 0.0–149.0)
VLDL: 18.4 mg/dL (ref 0.0–40.0)

## 2021-05-17 LAB — VITAMIN B12: Vitamin B-12: 223 pg/mL (ref 211–911)

## 2021-05-17 LAB — VITAMIN D 25 HYDROXY (VIT D DEFICIENCY, FRACTURES): VITD: 35.1 ng/mL (ref 30.00–100.00)

## 2021-05-17 LAB — TSH: TSH: 5.05 u[IU]/mL (ref 0.35–5.50)

## 2021-05-17 LAB — HEMOGLOBIN A1C: Hgb A1c MFr Bld: 4.4 % — ABNORMAL LOW (ref 4.6–6.5)

## 2021-05-17 MED ORDER — AZELAIC ACID 15 % EX GEL
1.0000 "application " | Freq: Two times a day (BID) | CUTANEOUS | 11 refills | Status: DC
  Filled 2021-05-17: qty 50, 30d supply, fill #0
  Filled 2022-05-09: qty 50, 30d supply, fill #1

## 2021-05-17 NOTE — Patient Instructions (Addendum)

## 2021-05-17 NOTE — Progress Notes (Signed)
   New Patient Visit  Subjective  Brooke Fox is a 30 y.o. female who presents for the following: New Patient (Initial Visit) (Patient has concerns with acne. She believes it has a lot to do with hormones. Just stopped birth control. Patient reports breakouts mainly on face. ).  Patient reports using some cream in past but did not help.   The following portions of the chart were reviewed this encounter and updated as appropriate:   Tobacco  Allergies  Meds  Problems  Med Hx  Surg Hx  Fam Hx      Review of Systems:  No other skin or systemic complaints except as noted in HPI or Assessment and Plan.  Objective  Well appearing patient in no apparent distress; mood and affect are within normal limits.  A focused examination was performed including face. Relevant physical exam findings are noted in the Assessment and Plan.  face Rare open comedone and few inflammatory papules, chest with rare inflammatory papules and back is clear   Assessment & Plan  Acne vulgaris face  Chronic condition with duration or expected duration over one year. Condition is bothersome to patient. Currently flared.  Patient reports she is trying to get pregnant  Recommend pregnancy safe options for treating acne  Start Azelaic Acid 15 % gel - apply 1 application topically twice daily to affected areas of face.  60 g 11 rf   Also recommend glycolic acid wash ([id number] or lower strength) or wipes daily. Can use less often if getting any irritation.   Patient instructed to let us know if Finacea not enough. Will prescribe clindamycin solution paired with CLN regular face wash.     Azelaic Acid (FINACEA) 15 % gel - face Apply 1 application topically 2 (two) times daily. After skin is thoroughly washed and patted dry, gently but thoroughly massage a thin film of azelaic acid cream into the affected area twice daily, in the morning and evening.  Return in about 3 months (around 08/17/2021) for acne  follow up .  I, Ruthell Rummage, CMA, am acting as scribe for Forest Gleason, MD.  Documentation: I have reviewed the above documentation for accuracy and completeness, and I agree with the above.  Forest Gleason, MD

## 2021-05-18 ENCOUNTER — Other Ambulatory Visit: Payer: Self-pay

## 2021-05-19 ENCOUNTER — Other Ambulatory Visit: Payer: Self-pay

## 2021-05-22 ENCOUNTER — Encounter: Payer: Self-pay | Admitting: Dermatology

## 2021-06-13 ENCOUNTER — Ambulatory Visit: Payer: No Typology Code available for payment source | Admitting: Neurology

## 2021-06-13 ENCOUNTER — Encounter: Payer: Self-pay | Admitting: Neurology

## 2021-06-13 VITALS — BP 146/90 | HR 81 | Ht 59.5 in | Wt 176.5 lb

## 2021-06-13 DIAGNOSIS — R490 Dysphonia: Secondary | ICD-10-CM

## 2021-06-13 DIAGNOSIS — E6609 Other obesity due to excess calories: Secondary | ICD-10-CM

## 2021-06-13 DIAGNOSIS — G43709 Chronic migraine without aura, not intractable, without status migrainosus: Secondary | ICD-10-CM

## 2021-06-13 DIAGNOSIS — R519 Headache, unspecified: Secondary | ICD-10-CM

## 2021-06-13 DIAGNOSIS — R0683 Snoring: Secondary | ICD-10-CM

## 2021-06-13 DIAGNOSIS — Z6835 Body mass index (BMI) 35.0-35.9, adult: Secondary | ICD-10-CM

## 2021-06-13 DIAGNOSIS — G4719 Other hypersomnia: Secondary | ICD-10-CM

## 2021-06-13 DIAGNOSIS — G478 Other sleep disorders: Secondary | ICD-10-CM | POA: Insufficient documentation

## 2021-06-13 DIAGNOSIS — E66812 Obesity, class 2: Secondary | ICD-10-CM | POA: Insufficient documentation

## 2021-06-13 DIAGNOSIS — G4733 Obstructive sleep apnea (adult) (pediatric): Secondary | ICD-10-CM | POA: Insufficient documentation

## 2021-06-13 NOTE — Patient Instructions (Signed)
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
## 2021-07-06 ENCOUNTER — Ambulatory Visit: Payer: No Typology Code available for payment source | Admitting: Neurology

## 2021-07-10 ENCOUNTER — Telehealth: Payer: Self-pay | Admitting: Neurology

## 2021-07-10 NOTE — Telephone Encounter (Signed)
Patient called to cancel her 07/11/21 appointment. WCB to r/s.

## 2021-07-11 ENCOUNTER — Ambulatory Visit: Payer: No Typology Code available for payment source | Admitting: Neurology

## 2021-07-17 ENCOUNTER — Ambulatory Visit (INDEPENDENT_AMBULATORY_CARE_PROVIDER_SITE_OTHER): Payer: No Typology Code available for payment source | Admitting: Neurology

## 2021-07-17 DIAGNOSIS — R519 Headache, unspecified: Secondary | ICD-10-CM

## 2021-07-17 DIAGNOSIS — R0683 Snoring: Secondary | ICD-10-CM

## 2021-07-17 DIAGNOSIS — G43709 Chronic migraine without aura, not intractable, without status migrainosus: Secondary | ICD-10-CM

## 2021-07-17 DIAGNOSIS — E6609 Other obesity due to excess calories: Secondary | ICD-10-CM

## 2021-07-17 DIAGNOSIS — G4733 Obstructive sleep apnea (adult) (pediatric): Secondary | ICD-10-CM | POA: Diagnosis not present

## 2021-07-17 DIAGNOSIS — Z6835 Body mass index (BMI) 35.0-35.9, adult: Secondary | ICD-10-CM

## 2021-07-17 DIAGNOSIS — G4719 Other hypersomnia: Secondary | ICD-10-CM

## 2021-07-24 ENCOUNTER — Encounter: Payer: Self-pay | Admitting: Plastic Surgery

## 2021-07-24 ENCOUNTER — Ambulatory Visit (INDEPENDENT_AMBULATORY_CARE_PROVIDER_SITE_OTHER): Payer: No Typology Code available for payment source | Admitting: Plastic Surgery

## 2021-07-24 ENCOUNTER — Other Ambulatory Visit: Payer: Self-pay

## 2021-07-24 DIAGNOSIS — N62 Hypertrophy of breast: Secondary | ICD-10-CM

## 2021-07-24 DIAGNOSIS — M542 Cervicalgia: Secondary | ICD-10-CM | POA: Diagnosis not present

## 2021-07-24 NOTE — Progress Notes (Signed)
[facility] TEST REPORT ( by Watch PAT)   STUDY DATE: 07-24-2021  DOB:  1991-05-23    ORDERING CLINICIAN: Larey Seat, MD  REFERRING CLINICIAN: Sarina Ill, MD   CLINICAL INFORMATION/HISTORY: patient is  a 30 year old female with history of changing levels of fatigue and sleepiness. Further endorsing : no history of Mono , but infected in 09-2019 with COVID- she recovered well, unsure of chronic fatigue onset in relation to infection. anatomical markers for OSA are BMI of 35 and high grade Mallampati ,   1) waking with morning headaches, not fully restored, refreshed, 2) EDS and fatigue , light sleeper, and now also snorer.  3) GERD is controlled, should not mimic apnea  She was a great sleeper all through school and college age. Sleep habits changed just before pregnancy.     My Plan is to proceed with:   1) HST to evaluate for OSA.    Epworth sleepiness score:13 /24. BMI: 34.6 kg/m Neck Circumference: 15"   FINDINGS:   Sleep Summary: The total recording time amounted to 7 hours 50 minutes of which 6 hours and 41 minutes with a calculated total related total sleep time.    Percent REM (%):   25.7%                                   Respiratory Indices:   Calculated pAHI (per hour): Amounted to only 4.4/h overall, and  during REM sleep at 9.4/h , in non-REM sleep 2.6/h.  There were no central or Cheyne-Stokes respirations noted.  Positional AHI was in supine sleep position 5.7/h,in prone sleep 2.5/h, and in nonsupine right and left-sided sleep 0.0/h.  Snoring level was average with a mean volume of 41 dB.  26.7% of sleep were accompanied by snoring in spite of the low apnea indices.                                                                               Oxygen Saturation Statistics:  O2 Saturation Range (%):  Varied between 85% and a maximum of 100% with a mean saturation of 96%.  The marked 77% oxygen nadir was a motion  artifact.                            O2 Saturation (minutes) <89%:  There was no hypoxia time recorded under 89% oxygen saturation.            Pulse Rate Statistics:          Pulse Range: Varied between 41 and 94 bpm with a mean heart rate of 57 bpm.                IMPRESSION:  This HST confirms the presence of snoring and very mild sleep apnea with REM accentuation. There was no apnea noted in left nor  right sided sleep position.    RECOMMENDATION: Avoiding supine sleep seems to be the most effective treatment for this very mild apnea which would not qualify  for intervention by CPAP.  I tentatively a dental device can be used to treat snoring but snoring is likely to also decrease with weight loss. I believe that the main diagnosis for this patient is chronic fatigue ( possible postviral fatigue syndrome).    INTERPRETING PHYSICIAN:   Larey Seat, MD   Medical Director of [facility] at [facility].

## 2021-07-24 NOTE — Progress Notes (Addendum)
Patient ID: Brooke Fox, female    DOB: 22-Mar-1991, 30 y.o.   MRN: [id number]   Chief Complaint  Patient presents with   Advice Only   Breast Problem    Mammary Hyperplasia: The patient is a 30 y.o. female with a history of mammary hyperplasia for several years.  She has extremely large breasts causing symptoms that include the following: Back pain in the upper and lower back, including neck pain. She pulls or pins her bra straps to provide better lift and relief of the pressure and pain. She notices relief by holding her breast up manually.  Her shoulder straps cause grooves and pain and pressure that requires padding for relief. Pain medication is sometimes required with motrin and tylenol.  Activities that are hindered by enlarged breasts include: exercise and running.  She has tried supportive clothing as well as fitted bras without improvement.  Her breasts are extremely large and fairly symmetric.  She has hyperpigmentation of the inframammary area on both sides.  The sternal to nipple distance on the right is 36 cm and the left is 38 cm.  The IMF distance is 18 cm.  She is 4 feet 11 inches tall and weighs 177 pounds.  The BMI = 35.5 kg/m.  Preoperative bra size = 38 I cup.  She would like to be a C cup. The estimated excess breast tissue to be removed at the time of surgery = 425 grams on the left and 425 grams on the right.  Mammogram history: none.  Family history of breast cancer:  1/2 aunt.  Tobacco use:  none.   The patient expresses the desire to pursue surgical intervention. 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  The patient was seen by neurosurgery for back pain and did a full regimen of PT in 2021.   Review of Systems  Constitutional: Negative.   HENT: Negative.    Eyes: Negative.   Respiratory: Negative.    Cardiovascular: Negative.  Negative for leg swelling.  Gastrointestinal: Negative.   Endocrine: Negative.   Genitourinary: Negative.   Musculoskeletal:  Positive for back  pain and neck pain.  Skin:  Positive for color change. Negative for wound.  Neurological: Negative.   Hematological: Negative.   Psychiatric/Behavioral: Negative.     Past Medical History:  Diagnosis Date   GERD (gastroesophageal reflux disease)    Heart murmur     Past Surgical History:  Procedure Laterality Date   CESAREAN SECTION     2014      Current Outpatient Medications:    Azelaic Acid (FINACEA) 15 % gel, Apply 1 application topically 2 (two) times daily. After skin is thoroughly washed and patted dry, gently but thoroughly massage a thin film of azelaic acid cream into the affected area twice daily, in the morning and evening., Disp: 50 g, Rfl: 11   cetirizine (ZYRTEC) 10 MG tablet, Take 10 mg by mouth daily., Disp: , Rfl:    naproxen sodium (ANAPROX) 550 MG tablet, TAKE AT EARLIEST ONSET OF HEADACHE. MAY REPEAT DOSE IN 12 HOURS IF NEEDED. DO NOT EXCEED 2 TABLETS IN 24 HOURS. TAKE WITH FOOD., Disp: 30 tablet, Rfl: 0   ondansetron (ZOFRAN-ODT) 4 MG disintegrating tablet, DISSOLVE 1 TABLET BY MOUTH EVERY 8 HOURS AS NEEDED FOR NAUSEA OR VOMITING., Disp: 30 tablet, Rfl: 0   propranolol ER (INDERAL LA) 60 MG 24 hr capsule, TAKE 1 CAPSULE BY MOUTH AT BEDTIME., Disp: 90 capsule, Rfl: 11   rizatriptan (MAXALT-MLT) 10  MG disintegrating tablet, TAKE 1 TABLET BY MOUTH AS NEEDED FOR MIGRAINE. MAY REPEAT IN 2 HOURS IF NEEDED, Disp: 9 tablet, Rfl: 11   Vitamin D, Ergocalciferol, (DRISDOL) 1.25 MG (50000 UNIT) CAPS capsule, TAKE 1 CAPSULE BY MOUTH EVERY 7 DAYS., Disp: 12 capsule, Rfl: 1   Objective:   Vitals:   07/24/21 1327  BP: 136/77  Pulse: (!) 56  SpO2: 99%    Physical Exam Vitals and nursing note reviewed.  Constitutional:      Appearance: Normal appearance.  HENT:     Head: Normocephalic and atraumatic.  Cardiovascular:     Rate and Rhythm: Normal rate.     Pulses: Normal pulses.  Pulmonary:     Effort: Pulmonary effort is normal. No respiratory distress.     Breath  sounds: No wheezing.  Abdominal:     General: There is no distension.     Palpations: Abdomen is soft.     Tenderness: There is no abdominal tenderness.  Musculoskeletal:        General: No swelling.     Cervical back: Normal range of motion.  Skin:    General: Skin is warm.     Capillary Refill: Capillary refill takes less than 2 seconds.     Coloration: Skin is not jaundiced.     Findings: No bruising.  Neurological:     Mental Status: She is alert and oriented to person, place, and time.  Psychiatric:        Mood and Affect: Mood normal.        Behavior: Behavior normal.        Thought Content: Thought content normal.    Assessment & Plan:  Symptomatic mammary hypertrophy  Neck pain  The procedure the patient selected and that was best for the patient was discussed. The risk were discussed and include but not limited to the following:  Breast asymmetry, fluid accumulation, firmness of the breast, inability to breast feed, loss of nipple or areola, skin loss, change in skin and nipple sensation, fat necrosis of the breast tissue, bleeding, infection and healing delay.  There are risks of anesthesia and injury to nerves or blood vessels.  Allergic reaction to tape, suture and skin glue are possible.  There will be swelling.  Any of these can lead to the need for revisional surgery.  A breast reduction has potential to interfere with diagnostic procedures in the future.  This procedure is best done when the breast is fully developed.  Changes in the breast will continue to occur over time: pregnancy, weight gain or weigh loss.    Total time: 40 minutes. This includes time spent with the patient during the visit as well as time spent before and after the visit reviewing the chart, documenting the encounter and ordering pertinent studies. and literature emailed to the patient.   Physical therapy:  will request records Mammogram:  ordered Healthy Weight and Wellness:  patient will do the  10 days sugar free challenge.   The patient is an excellent candidate for bilateral breast reduction with possible liposuction.   Pictures were obtained of the patient and placed in the chart with the patient's or guardian's permission.    [place], DO

## 2021-07-25 ENCOUNTER — Encounter: Payer: Self-pay | Admitting: Plastic Surgery

## 2021-07-26 ENCOUNTER — Ambulatory Visit
Admission: RE | Admit: 2021-07-26 | Discharge: 2021-07-26 | Disposition: A | Discharge status: Home / Self Care | Payer: No Typology Code available for payment source | Source: Ambulatory Visit | Attending: Plastic Surgery | Admitting: Plastic Surgery

## 2021-07-26 ENCOUNTER — Other Ambulatory Visit: Payer: Self-pay | Admitting: Plastic Surgery

## 2021-07-26 ENCOUNTER — Other Ambulatory Visit: Payer: Self-pay

## 2021-07-26 DIAGNOSIS — M542 Cervicalgia: Secondary | ICD-10-CM | POA: Diagnosis present

## 2021-07-26 DIAGNOSIS — R928 Other abnormal and inconclusive findings on diagnostic imaging of breast: Secondary | ICD-10-CM

## 2021-07-26 DIAGNOSIS — N6489 Other specified disorders of breast: Secondary | ICD-10-CM

## 2021-07-26 DIAGNOSIS — N62 Hypertrophy of breast: Secondary | ICD-10-CM | POA: Insufficient documentation

## 2021-07-26 DIAGNOSIS — Z1231 Encounter for screening mammogram for malignant neoplasm of breast: Secondary | ICD-10-CM | POA: Diagnosis not present

## 2021-07-26 NOTE — Addendum Note (Signed)
Addended by: Wallace Going on: 07/26/2021 01:07 PM   Modules accepted: Orders

## 2021-07-26 NOTE — Addendum Note (Signed)
Addended by: Wallace Going on: 07/26/2021 04:03 PM   Modules accepted: Orders

## 2021-08-01 ENCOUNTER — Encounter: Payer: Self-pay | Admitting: *Deleted

## 2021-08-01 NOTE — Progress Notes (Signed)
No follow up with sleep clinic is needed: see results here.  This HST confirms the presence of snoring and very mild sleep apnea with REM accentuation. There was no apnea noted in left nor right sided sleep position.   RECOMMENDATION: Avoiding supine sleep seems to be the most effective treatment for this very mild apnea which would not qualify for intervention by CPAP.   I tentatively recommend a dental device, which can be used to treat snoring but snoring is likely to also decrease with weight loss. I believe that the main diagnosis for this patient is chronic fatigue ( possible postviral fatigue syndrome).

## 2021-08-01 NOTE — Procedures (Signed)
[facility] TEST REPORT ( by Watch PAT)   STUDY DATE: 07-24-2021  DOB:  01/27/1991    ORDERING CLINICIAN: Larey Seat, MD  REFERRING CLINICIAN: Sarina Ill, MD   CLINICAL INFORMATION/HISTORY: patient is  a 30 year old female with history of changing levels of fatigue and sleepiness. Further endorsing : no history of Mono , but infected in 09-2019 with COVID- she recovered well, unsure of chronic fatigue onset in relation to infection. anatomical markers for OSA are BMI of 35 and high grade Mallampati ,   1) waking with morning headaches, not fully restored, refreshed, 2) EDS and fatigue , light sleeper, and now also snorer.  3) GERD is controlled, should not mimic apnea  She was a great sleeper all through school and college age. Sleep habits changed just before pregnancy.     My Plan is to proceed with:   1) HST to evaluate for OSA.    Epworth sleepiness score:13 /24. BMI: 34.6 kg/m Neck Circumference: 15"   FINDINGS:   Sleep Summary: The total recording time amounted to 7 hours 50 minutes of which 6 hours and 41 minutes with a calculated total related total sleep time.    Percent REM (%):   25.7%                                   Respiratory Indices:   Calculated pAHI (per hour): Amounted to only 4.4/h overall, and  during REM sleep at 9.4/h , in non-REM sleep 2.6/h.  There were no central or Cheyne-Stokes respirations noted.  Positional AHI was in supine sleep position 5.7/h,in prone sleep 2.5/h, and in nonsupine right and left-sided sleep 0.0/h.  Snoring level was average with a mean volume of 41 dB.  26.7% of sleep were accompanied by snoring in spite of the low apnea indices.                                                                               Oxygen Saturation Statistics:  O2 Saturation Range (%):  Varied between 85% and a maximum of 100% with a mean saturation of 96%.  The marked 77% oxygen nadir was a motion artifact.                             O2 Saturation (minutes) <89%:  There was no hypoxia time recorded under 89% oxygen saturation.            Pulse Rate Statistics:          Pulse Range: Varied between 41 and 94 bpm with a mean heart rate of 57 bpm.                IMPRESSION:  This HST confirms the presence of snoring and very mild sleep apnea with REM accentuation. There was no apnea noted in left nor  right sided sleep position.    RECOMMENDATION: Avoiding supine sleep seems to be the most effective treatment for this very mild apnea which would not qualify for intervention by CPAP.  I  tentatively a dental device can be used to treat snoring but snoring is likely to also decrease with weight loss. I believe that the main diagnosis for this patient is chronic fatigue ( possible postviral fatigue syndrome).    INTERPRETING PHYSICIAN:   Larey Seat, MD   Medical Director of [facility] at [facility].

## 2021-08-02 ENCOUNTER — Encounter: Payer: Self-pay | Admitting: Family

## 2021-08-07 ENCOUNTER — Other Ambulatory Visit: Payer: No Typology Code available for payment source

## 2021-08-07 ENCOUNTER — Ambulatory Visit: Payer: No Typology Code available for payment source

## 2021-08-08 ENCOUNTER — Ambulatory Visit
Admission: RE | Admit: 2021-08-08 | Discharge: 2021-08-08 | Disposition: A | Discharge status: Home / Self Care | Payer: No Typology Code available for payment source | Source: Ambulatory Visit | Attending: Plastic Surgery | Admitting: Plastic Surgery

## 2021-08-08 ENCOUNTER — Other Ambulatory Visit: Payer: Self-pay | Admitting: Plastic Surgery

## 2021-08-08 ENCOUNTER — Other Ambulatory Visit: Payer: Self-pay

## 2021-08-08 DIAGNOSIS — N6489 Other specified disorders of breast: Secondary | ICD-10-CM

## 2021-08-08 DIAGNOSIS — R928 Other abnormal and inconclusive findings on diagnostic imaging of breast: Secondary | ICD-10-CM

## 2021-08-08 DIAGNOSIS — N6320 Unspecified lump in the left breast, unspecified quadrant: Secondary | ICD-10-CM

## 2021-08-09 ENCOUNTER — Other Ambulatory Visit: Payer: No Typology Code available for payment source

## 2021-08-09 ENCOUNTER — Ambulatory Visit: Admission: RE | Admit: 2021-08-09 | Payer: No Typology Code available for payment source | Source: Ambulatory Visit

## 2021-08-17 ENCOUNTER — Other Ambulatory Visit: Payer: Self-pay

## 2021-08-17 ENCOUNTER — Ambulatory Visit
Admission: RE | Admit: 2021-08-17 | Discharge: 2021-08-17 | Disposition: A | Discharge status: Home / Self Care | Payer: No Typology Code available for payment source | Source: Ambulatory Visit | Attending: Plastic Surgery | Admitting: Plastic Surgery

## 2021-08-17 DIAGNOSIS — R928 Other abnormal and inconclusive findings on diagnostic imaging of breast: Secondary | ICD-10-CM

## 2021-08-17 DIAGNOSIS — N6320 Unspecified lump in the left breast, unspecified quadrant: Secondary | ICD-10-CM | POA: Insufficient documentation

## 2021-08-17 HISTORY — PX: OTHER SURGICAL HISTORY: SHX169

## 2021-08-18 ENCOUNTER — Institutional Professional Consult (permissible substitution): Payer: No Typology Code available for payment source | Admitting: Plastic Surgery

## 2021-08-18 ENCOUNTER — Ambulatory Visit: Admission: RE | Admit: 2021-08-18 | Payer: No Typology Code available for payment source | Source: Ambulatory Visit

## 2021-08-18 LAB — SURGICAL PATHOLOGY

## 2021-08-21 ENCOUNTER — Encounter: Payer: Self-pay | Admitting: Family

## 2021-08-21 ENCOUNTER — Other Ambulatory Visit: Payer: Self-pay

## 2021-08-21 ENCOUNTER — Telehealth: Payer: No Typology Code available for payment source | Admitting: Family Medicine

## 2021-08-21 ENCOUNTER — Telehealth: Payer: Self-pay

## 2021-08-21 DIAGNOSIS — J069 Acute upper respiratory infection, unspecified: Secondary | ICD-10-CM | POA: Diagnosis not present

## 2021-08-21 MED ORDER — IPRATROPIUM BROMIDE 0.06 % NA SOLN
1.0000 | Freq: Three times a day (TID) | NASAL | 12 refills | Status: DC
  Filled 2021-08-21: qty 15, 30d supply, fill #0

## 2021-08-21 MED ORDER — BENZONATATE 100 MG PO CAPS
100.0000 mg | ORAL_CAPSULE | Freq: Two times a day (BID) | ORAL | 0 refills | Status: DC | PRN
Start: 2021-08-21 — End: 2021-10-24
  Filled 2021-08-21: qty 20, 5d supply, fill #0

## 2021-08-21 NOTE — Telephone Encounter (Signed)
Hey Dr. Vidal Schwalbe,       I was trying not to bother you and do an E-visit but that didn't go far. So every year around this time I get a really bad sinus infection and at times it will go to my chest and cause bronchitis. My symptoms started Thursday last week (so 5days) and it is now bloody sputum, hoarseness and sore throat (drainage)coughing and difficult to breath at times. Is there anyways I could get an antibiotic and a prednisone dose pack to get this out of my chest. Thank you for your time and sorry again to have bother you.

## 2021-08-21 NOTE — Progress Notes (Signed)
° °E-Visit for Upper Respiratory Infection  ° °We are sorry you are not feeling well.  Here is how we plan to help! ° °Based on what you have shared with me, it looks like you may have a viral upper respiratory infection.  Upper respiratory infections are caused by a large number of viruses; however, rhinovirus is the most common cause.  ° °Symptoms vary from person to person, with common symptoms including sore throat, cough, fatigue or lack of energy and feeling of general discomfort.  A low-grade fever of up to 100.4 may present, but is often uncommon.  Symptoms vary however, and are closely related to a person's age or underlying illnesses.  The most common symptoms associated with an upper respiratory infection are nasal discharge or congestion, cough, sneezing, headache and pressure in the ears and face.  These symptoms usually persist for about 3 to 10 days, but can last up to 2 weeks.  It is important to know that upper respiratory infections do not cause serious illness or complications in most cases.   ° °Upper respiratory infections can be transmitted from person to person, with the most common method of transmission being a person's hands.  The virus is able to live on the skin and can infect other persons for up to 2 hours after direct contact.  Also, these can be transmitted when someone coughs or sneezes; thus, it is important to cover the mouth to reduce this risk.  To keep the spread of the illness at bay, good hand hygiene is very important. ° °This is an infection that is most likely caused by a virus. There are no specific treatments other than to help you with the symptoms until the infection runs its course.  We are sorry you are not feeling well.  Here is how we plan to help! ° ° °For nasal congestion, you may use an oral decongestants such as Mucinex D or if you have glaucoma or high blood pressure use plain Mucinex.  Saline nasal spray or nasal drops can help and can safely be used as often  as needed for congestion.  For your congestion, I have prescribed Ipratropium Bromide nasal spray 0.03% two sprays in each nostril 2-3 times a day ° °If you do not have a history of heart disease, hypertension, diabetes or thyroid disease, prostate/bladder issues or glaucoma, you may also use Sudafed to treat nasal congestion.  It is highly recommended that you consult with a pharmacist or your primary care physician to ensure this medication is safe for you to take.    ° °If you have a cough, you may use cough suppressants such as Delsym and Robitussin.  If you have glaucoma or high blood pressure, you can also use Coricidin HBP.   °For cough I have prescribed for you A prescription cough medication called Tessalon Perles 100 mg. You may take 1-2 capsules every 8 hours as needed for cough ° °If you have a sore or scratchy throat, use a saltwater gargle- ¼ to ½ teaspoon of salt dissolved in a 4-ounce to 8-ounce glass of warm water.  Gargle the solution for approximately 15-30 seconds and then spit.  It is important not to swallow the solution.  You can also use throat lozenges/cough drops and Chloraseptic spray to help with throat pain or discomfort.  Warm or cold liquids can also be helpful in relieving throat pain. ° °For headache, pain or general discomfort, you can use Ibuprofen or Tylenol as directed.   °  Some authorities believe that zinc sprays or the use of Echinacea may shorten the course of your symptoms. ° ° °HOME CARE °Only take medications as instructed by your medical team. °Be sure to drink plenty of fluids. Water is fine as well as fruit juices, sodas and electrolyte beverages. You may want to stay away from caffeine or alcohol. If you are nauseated, try taking small sips of liquids. How do you know if you are getting enough fluid? Your urine should be a pale yellow or almost colorless. °Get rest. °Taking a steamy shower or using a humidifier may help nasal congestion and ease sore throat pain. You  can place a towel over your head and breathe in the steam from hot water coming from a faucet. °Using a saline nasal spray works much the same way. °Cough drops, hard candies and sore throat lozenges may ease your cough. °Avoid close contacts especially the very young and the elderly °Cover your mouth if you cough or sneeze °Always remember to wash your hands.  ° °GET HELP RIGHT AWAY IF: °You develop worsening fever. °If your symptoms do not improve within 10 days °You develop yellow or green discharge from your nose over 3 days. °You have coughing fits °You develop a severe head ache or visual changes. °You develop shortness of breath, difficulty breathing or start having chest pain °Your symptoms persist after you have completed your treatment plan ° °MAKE SURE YOU  °Understand these instructions. °Will watch your condition. °Will get help right away if you are not doing well or get worse. ° °Thank you for choosing an e-visit. ° °Your e-visit answers were reviewed by a board certified advanced clinical practitioner to complete your personal care plan. Depending upon the condition, your plan could have included both over the counter or prescription medications. ° °Please review your pharmacy choice. Make sure the pharmacy is open so you can pick up prescription now. If there is a problem, you may contact your provider through MyChart messaging and have the prescription routed to another pharmacy.  Your safety is important to us. If you have drug allergies check your prescription carefully.  ° °For the next 24 hours you can use MyChart to ask questions about today's visit, request a non-urgent call back, or ask for a work or school excuse. °You will get an email in the next two days asking about your experience. I hope that your e-visit has been valuable and will speed your recovery. ° ° ° °I provided 5 minutes of non face-to-face time during this encounter for chart review, medication and order placement, as well  as and documentation.  ° °

## 2021-08-21 NOTE — Telephone Encounter (Signed)
LMTCB to triage symptoms & see if patient could do appointment tomorrow with Abby Potash or Sharyn Lull.

## 2021-08-29 NOTE — Telephone Encounter (Signed)
I have sent mychart to patient.

## 2021-08-31 ENCOUNTER — Ambulatory Visit: Payer: No Typology Code available for payment source | Admitting: Dermatology

## 2021-09-01 ENCOUNTER — Other Ambulatory Visit: Payer: Self-pay

## 2021-09-12 ENCOUNTER — Ambulatory Visit: Payer: No Typology Code available for payment source | Admitting: Neurology

## 2021-09-12 ENCOUNTER — Other Ambulatory Visit: Payer: Self-pay

## 2021-09-12 DIAGNOSIS — G43709 Chronic migraine without aura, not intractable, without status migrainosus: Secondary | ICD-10-CM

## 2021-09-12 DIAGNOSIS — G47419 Narcolepsy without cataplexy: Secondary | ICD-10-CM

## 2021-09-12 DIAGNOSIS — F988 Other specified behavioral and emotional disorders with onset usually occurring in childhood and adolescence: Secondary | ICD-10-CM

## 2021-09-12 MED ORDER — AMPHETAMINE-DEXTROAMPHETAMINE 10 MG PO TABS
10.0000 mg | ORAL_TABLET | Freq: Two times a day (BID) | ORAL | 0 refills | Status: DC
  Filled 2021-09-12: qty 60, 30d supply, fill #0

## 2021-09-12 NOTE — Progress Notes (Signed)
Consent Form Botulism Toxin Injection For Chronic Migraine   09/14/2021: stable and doing great  April 06, 2021: Tremendous improvement, patient is extremely thrilled, greater than 90% improvement in migraine frequency and severity.  Today she did talk to me about her sleeping problems, she is very fatigued, snoring, morning headaches, referred her to sleep studies which was negative. Patient has untreated adhd, we can try Adderall and have her back for follow up to discuss  +a. She has a heavy brow so I did the frontalis higher up, .   Reviewed orally with patient, additionally signature is on file:  Botulism toxin has been approved by the Federal drug administration for treatment of chronic migraine. Botulism toxin does not cure chronic migraine and it may not be effective in some patients.  The administration of botulism toxin is accomplished by injecting a small amount of toxin into the muscles of the neck and head. Dosage must be titrated for each individual. Any benefits resulting from botulism toxin tend to wear off after 3 months with a repeat injection required if benefit is to be maintained. Injections are usually done every 3-4 months with maximum effect peak achieved by about 2 or 3 weeks. Botulism toxin is expensive and you should be sure of what costs you will incur resulting from the injection.  The side effects of botulism toxin use for chronic migraine may include:   -Transient, and usually mild, facial weakness with facial injections  -Transient, and usually mild, head or neck weakness with head/neck injections  -Reduction or loss of forehead facial animation due to forehead muscle weakness  -Eyelid drooping  -Dry eye  -Pain at the site of injection or bruising at the site of injection  -Double vision  -Potential unknown long term risks  Contraindications: You should not have Botox if you are pregnant, nursing, allergic to albumin, have an infection, skin condition, or  muscle weakness at the site of the injection, or have myasthenia gravis, Lambert-Eaton syndrome, or ALS.  It is also possible that as with any injection, there may be an allergic reaction or no effect from the medication. Reduced effectiveness after repeated injections is sometimes seen and rarely infection at the injection site may occur. All care will be taken to prevent these side effects. If therapy is given over a long time, atrophy and wasting in the muscle injected may occur. Occasionally the patient's become refractory to treatment because they develop antibodies to the toxin. In this event, therapy needs to be modified.  I have read the above information and consent to the administration of botulism toxin.    BOTOX PROCEDURE NOTE FOR MIGRAINE HEADACHE    Contraindications and precautions discussed with patient(above). Aseptic procedure was observed and patient tolerated procedure. Procedure performed by Dr. Georgia Dom  The condition has existed for more than 6 months, and pt does not have a diagnosis of ALS, Myasthenia Gravis or Lambert-Eaton Syndrome.  Risks and benefits of injections discussed and pt agrees to proceed with the procedure.  Written consent obtained  These injections are medically necessary. Pt  receives good benefits from these injections. These injections do not cause sedations or hallucinations which the oral therapies may cause.  Description of procedure:  The patient was placed in a sitting position. The standard protocol was used for Botox as follows, with 5 units of Botox injected at each site:   -Procerus muscle, midline injection  -Corrugator muscle, bilateral injection  -Frontalis muscle, bilateral injection, with 2 sites each side,  medial injection was performed in the upper one third of the frontalis muscle, in the region vertical from the medial inferior edge of the superior orbital rim. The lateral injection was again in the upper one third of the  forehead vertically above the lateral limbus of the cornea, 1.5 cm lateral to the medial injection site.  -Temporalis muscle injection, 4 sites, bilaterally. The first injection was 3 cm above the tragus of the ear, second injection site was 1.5 cm to 3 cm up from the first injection site in line with the tragus of the ear. The third injection site was 1.5-3 cm forward between the first 2 injection sites. The fourth injection site was 1.5 cm posterior to the second injection site.   -Occipitalis muscle injection, 3 sites, bilaterally. The first injection was done one half way between the occipital protuberance and the tip of the mastoid process behind the ear. The second injection site was done lateral and superior to the first, 1 fingerbreadth from the first injection. The third injection site was 1 fingerbreadth superiorly and medially from the first injection site.  -Cervical paraspinal muscle injection, 2 sites, bilateral knee first injection site was 1 cm from the midline of the cervical spine, 3 cm inferior to the lower border of the occipital protuberance. The second injection site was 1.5 cm superiorly and laterally to the first injection site.  -Trapezius muscle injection was performed at 3 sites, bilaterally. The first injection site was in the upper trapezius muscle halfway between the inflection point of the neck, and the acromion. The second injection site was one half way between the acromion and the first injection site. The third injection was done between the first injection site and the inflection point of the neck.   Will return for repeat injection in 3 months.   155 units of Botox was used, 45 Botox not injected was wasted. The patient tolerated the procedure well, there were no complications of the above procedure.

## 2021-09-12 NOTE — Progress Notes (Signed)
Botox- 200 units x 1 vial Lot: [id number] Expiration: 06/2024 NDC: 4599-7741-42  Bacteriostatic 0.9% Sodium Chloride- 28mL total Lot: [id number] Expiration: 10/01/2022 NDC: 2334-3568-61  Dx: U83.729  B/B

## 2021-09-13 ENCOUNTER — Other Ambulatory Visit: Payer: Self-pay

## 2021-09-13 MED FILL — Rizatriptan Benzoate Oral Disintegrating Tab 10 MG (Base Eq): ORAL | 15 days supply | Qty: 9 | Fill #0 | Status: CN

## 2021-09-20 LAB — NARCOLEPSY EVALUATION
DQA1*01:02: NEGATIVE
DQB1*06:02: NEGATIVE

## 2021-09-21 ENCOUNTER — Telehealth: Payer: Self-pay | Admitting: Plastic Surgery

## 2021-09-21 NOTE — Telephone Encounter (Signed)
Patient called to advise that her biopsy was not covered because it was not pre certified and we needed to do a retro authorization. Insurance contacted and retro authorization obtained for breast biopsy done on 08/17/2021. Auth # [id number]. Anda Kraft, rep with insurance, advised claims department will be notified and all parties will receive a letter advising of approval. Patient was called and advised of authorization.

## 2021-09-27 ENCOUNTER — Other Ambulatory Visit: Payer: Self-pay

## 2021-09-27 ENCOUNTER — Encounter: Payer: Self-pay | Admitting: Neurology

## 2021-09-27 ENCOUNTER — Other Ambulatory Visit: Payer: Self-pay | Admitting: Neurology

## 2021-09-27 DIAGNOSIS — F988 Other specified behavioral and emotional disorders with onset usually occurring in childhood and adolescence: Secondary | ICD-10-CM

## 2021-09-27 DIAGNOSIS — G47419 Narcolepsy without cataplexy: Secondary | ICD-10-CM

## 2021-09-27 MED ORDER — AMPHETAMINE-DEXTROAMPHETAMINE 20 MG PO TABS
20.0000 mg | ORAL_TABLET | Freq: Two times a day (BID) | ORAL | 0 refills | Status: DC
  Filled 2021-09-27: qty 60, 30d supply, fill #0

## 2021-09-27 MED FILL — Propranolol HCl Cap ER 24HR 60 MG: ORAL | 90 days supply | Qty: 90 | Fill #1 | Status: AC

## 2021-09-28 ENCOUNTER — Other Ambulatory Visit: Payer: Self-pay

## 2021-09-29 ENCOUNTER — Other Ambulatory Visit: Payer: Self-pay

## 2021-09-29 MED FILL — Rizatriptan Benzoate Oral Disintegrating Tab 10 MG (Base Eq): ORAL | 30 days supply | Qty: 9 | Fill #0 | Status: AC

## 2021-10-10 ENCOUNTER — Ambulatory Visit: Payer: No Typology Code available for payment source | Admitting: Neurology

## 2021-10-18 ENCOUNTER — Telehealth: Payer: Self-pay | Admitting: Neurology

## 2021-10-18 NOTE — Telephone Encounter (Signed)
Patient has Botox appointment on 03/14. Her Botox authorization through [facility] expired on 09/30/2021. I called [facility] at [phone] and spoke with Jackelyn Poling to start new authorization for CPT [phone] and [id number]. Dx: M93.112. Faxed clinicals for review and case is pending.

## 2021-10-23 NOTE — Telephone Encounter (Signed)
Received approval from [facility]. PA #[id number] (12/12/21-12/13/22).

## 2021-10-24 ENCOUNTER — Other Ambulatory Visit: Payer: Self-pay

## 2021-10-24 ENCOUNTER — Ambulatory Visit (INDEPENDENT_AMBULATORY_CARE_PROVIDER_SITE_OTHER): Payer: No Typology Code available for payment source | Admitting: Surgical

## 2021-10-24 ENCOUNTER — Encounter: Payer: Self-pay | Admitting: Surgical

## 2021-10-24 VITALS — BP 141/89 | HR 84 | Ht 59.5 in | Wt 173.2 lb

## 2021-10-24 DIAGNOSIS — N62 Hypertrophy of breast: Secondary | ICD-10-CM

## 2021-10-24 DIAGNOSIS — M542 Cervicalgia: Secondary | ICD-10-CM

## 2021-10-24 MED ORDER — ONDANSETRON HCL 4 MG PO TABS
4.0000 mg | ORAL_TABLET | Freq: Three times a day (TID) | ORAL | 0 refills | Status: DC | PRN
  Filled 2021-10-24: qty 20, 7d supply, fill #0

## 2021-10-24 MED ORDER — HYDROCODONE-ACETAMINOPHEN 5-325 MG PO TABS
1.0000 | ORAL_TABLET | Freq: Four times a day (QID) | ORAL | 0 refills | Status: AC | PRN
  Filled 2021-10-24: qty 20, 5d supply, fill #0

## 2021-10-24 MED ORDER — CEPHALEXIN 500 MG PO CAPS
500.0000 mg | ORAL_CAPSULE | Freq: Four times a day (QID) | ORAL | 0 refills | Status: AC
  Filled 2021-10-24: qty 12, 3d supply, fill #0

## 2021-10-24 NOTE — Progress Notes (Signed)
Patient ID: Brooke Fox, female    DOB: 03-Jul-1991, 31 y.o.   MRN: [id number]  Chief Complaint  Patient presents with   Pre-op Exam      ICD-10-CM   1. Symptomatic mammary hypertrophy  N62     2. Neck pain  M54.2        History of Present Illness: Brooke Fox is a 31 y.o.  female  with a history of macromastia.  She presents for preoperative evaluation for upcoming procedure, Bilateral Breast Reduction, scheduled for 11/15/2021 with Dr.  Marla Roe  No personal history of anesthesia.  Reports her sister has a history of anesthesia with bad nausea afterwards.  No other family or known history of complications with anesthesia. No history of DVT/PE.  No family history of DVT/PE.  No family or personal history of bleeding or clotting disorders.  Patient is not currently taking any blood thinners.  No history of CVA/MI.   Estimated excess breast tissue to be removed at time of surgery: 425 grams.  Patient would like to be approximately a small C cup.  Job: Works at [employer]  PMH Significant for: Heart murmur as a child, reports she had an echo which was was normal.  She reports she was recently tested for OSA, reports this was normal.  She has chronic migraines. She had echocardiogram September 2021, very slight mitral valve and tricuspid regurgitation.  Echo showed normal ejection fraction.  She reports no symptoms of this.  She has been feeling well lately.  No recent changes in her health.  Patient underwent screening mammogram prior to breast reduction surgery in October 2022 with subsequent ultrasound of her left breast on 08/17/2021, pathology revealed benign breast tissue with hyalinized fibroadenoma and pseudo angiomatous stromal hyperplasia.  Negative for atypia and malignancy.  Past Medical History: Allergies: No Known Allergies  Current Medications:  Current Outpatient Medications:    amphetamine-dextroamphetamine (ADDERALL) 20 MG tablet,  Take 1 tablet (20 mg total) by mouth 2 (two) times daily., Disp: 60 tablet, Rfl: 0   Azelaic Acid (FINACEA) 15 % gel, Apply 1 application topically 2 (two) times daily. After skin is thoroughly washed and patted dry, gently but thoroughly massage a thin film of azelaic acid cream into the affected area twice daily, in the morning and evening., Disp: 50 g, Rfl: 11   cephALEXin (KEFLEX) 500 MG capsule, Take 1 capsule (500 mg total) by mouth 4 (four) times daily for 3 days., Disp: 12 capsule, Rfl: 0   cetirizine (ZYRTEC) 10 MG tablet, Take 10 mg by mouth daily., Disp: , Rfl:    HYDROcodone-acetaminophen (NORCO) 5-325 MG tablet, Take 1 tablet by mouth every 6 (six) hours as needed for up to 5 days for severe pain., Disp: 20 tablet, Rfl: 0   ondansetron (ZOFRAN) 4 MG tablet, Take 1 tablet (4 mg total) by mouth every 8 (eight) hours as needed for nausea or vomiting., Disp: 20 tablet, Rfl: 0   propranolol ER (INDERAL LA) 60 MG 24 hr capsule, TAKE 1 CAPSULE BY MOUTH AT BEDTIME., Disp: 90 capsule, Rfl: 11   rizatriptan (MAXALT-MLT) 10 MG disintegrating tablet, TAKE 1 TABLET BY MOUTH AS NEEDED FOR MIGRAINE. MAY REPEAT IN 2 HOURS IF NEEDED, Disp: 9 tablet, Rfl: 11   Vitamin D, Ergocalciferol, (DRISDOL) 1.25 MG (50000 UNIT) CAPS capsule, TAKE 1 CAPSULE BY MOUTH EVERY 7 DAYS., Disp: 12 capsule, Rfl: 1  Past Medical Problems: Past Medical History:  Diagnosis Date  GERD (gastroesophageal reflux disease)    Heart murmur     Past Surgical History: Past Surgical History:  Procedure Laterality Date   breast biopsy Left 08/17/2021   u/s bx, ribbon clip, path pending   CESAREAN SECTION     2014    Social History: Social History   Socioeconomic History   Marital status: Married    Spouse name: Not on file   Number of children: 1   Years of education: Not on file   Highest education level: Not on file  Occupational History   Not on file  Tobacco Use   Smoking status: Never   Smokeless tobacco: Never   Vaping Use   Vaping Use: Never used  Substance and Sexual Activity   Alcohol use: Yes    Comment: ocassionally   Drug use: No   Sexual activity: Yes    Birth control/protection: None  Other Topics Concern   Not on file  Social History Narrative   Mom is patient is mine.   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         GNA:   Lives at home with family   Caffeine: about 4 cups/day   Right handed   Social Determinants of Health   Financial Resource Strain: Not on file  Food Insecurity: Not on file  Transportation Needs: Not on file  Physical Activity: Not on file  Stress: Not on file  Social Connections: Not on file  Intimate Partner Violence: Not on file    Family History: Family History  Problem Relation Age of Onset   Anemia Mother    Osteoporosis Mother    Heart disease Father    Migraines Sister    Breast cancer Maternal Aunt 36   Cancer Maternal Aunt        breast    Review of Systems: Review of Systems  Constitutional: Negative.   Respiratory: Negative.    Cardiovascular: Negative.   Gastrointestinal: Negative.   Neurological: Negative.    Physical Exam: Vital Signs BP (!) 141/89 (BP Location: Left Arm, Patient Position: Sitting, Cuff Size: Large)    Pulse 84    Ht 4' 11.5" (1.511 m)    Wt 173 lb 3.2 oz (78.6 kg)    PF 100 L/min    BMI 34.40 kg/m   Physical Exam  Constitutional:      General: Not in acute distress.    Appearance: Normal appearance. Not ill-appearing.  HENT:     Head: Normocephalic and atraumatic.  Eyes:     Pupils: Pupils are equal, round Neck:     Musculoskeletal: Normal range of motion.  Cardiovascular:     Rate and Rhythm: Normal rate    Pulses: Normal pulses.  Pulmonary:     Effort: Pulmonary effort is normal. No respiratory distress.  Musculoskeletal: Normal range of motion.  Skin:    General: Skin is warm and dry.     Findings: No erythema or rash.  Neurological:     General: No focal deficit present.      Mental Status: Alert and oriented to person, place, and time. Mental status is at baseline.     Motor: No weakness.  Psychiatric:        Mood and Affect: Mood normal.        Behavior: Behavior normal.    Assessment/Plan: The patient is scheduled for bilateral breast reduction with Dr. Marla Roe.  Risks, benefits, and alternatives of procedure discussed, questions answered and  consent obtained.    Smoking Status: Non-smoker; Counseling Given?  N/A Last Mammogram: Recent bilateral mammogram with subsequent ultrasound and biopsy of left breast; Results: Negative for malignancy or atypia  Caprini Score: 3, moderate; Risk Factors include: BMI greater than 25, and length of planned surgery. Recommendation for mechanical prophylaxis. Encourage early ambulation.   Pictures obtained: @consult   Post-op Rx sent to pharmacy: Norco, Zofran, Keflex  Patient was provided with the breast reduction and General Surgical Risk consent document and Pain Medication Agreement prior to their appointment.  They had adequate time to read through the risk consent documents and Pain Medication Agreement. We also discussed them in person together during this preop appointment. All of their questions were answered to their satisfaction.  Recommended calling if they have any further questions.  Risk consent form and Pain Medication Agreement to be scanned into patient's chart.  The risk that can be encountered with breast reduction were discussed and include the following but not limited to these:  Breast asymmetry, fluid accumulation, firmness of the breast, inability to breast feed, loss of nipple or areola, skin loss, decrease or no nipple sensation, fat necrosis of the breast tissue, bleeding, infection, healing delay.  There are risks of anesthesia, changes to skin sensation and injury to nerves or blood vessels.  The muscle can be temporarily or permanently injured.  You may have an allergic reaction to tape,  suture, glue, blood products which can result in skin discoloration, swelling, pain, skin lesions, poor healing.  Any of these can lead to the need for revisonal surgery or stage procedures.  A reduction has potential to interfere with diagnostic procedures.  Nipple or breast piercing can increase risks of infection.  This procedure is best done when the breast is fully developed.  Changes in the breast will continue to occur over time.  Pregnancy can alter the outcomes of previous breast reduction surgery, weight gain and weigh loss can also effect the long term appearance.   Electronically signed by: Carola Rhine Karne Ozga, PA-C 10/24/2021 3:36 PM

## 2021-10-24 NOTE — H&P (View-Only) (Signed)
Patient ID: Brooke Fox, female    DOB: September 04, 1991, 31 y.o.   MRN: [id number]  Chief Complaint  Patient presents with   Pre-op Exam      ICD-10-CM   1. Symptomatic mammary hypertrophy  N62     2. Neck pain  M54.2        History of Present Illness: Brooke Fox is a 31 y.o.  female  with a history of macromastia.  She presents for preoperative evaluation for upcoming procedure, Bilateral Breast Reduction, scheduled for 11/15/2021 with Dr.  Marla Roe  No personal history of anesthesia.  Reports her sister has a history of anesthesia with bad nausea afterwards.  No other family or known history of complications with anesthesia. No history of DVT/PE.  No family history of DVT/PE.  No family or personal history of bleeding or clotting disorders.  Patient is not currently taking any blood thinners.  No history of CVA/MI.   Estimated excess breast tissue to be removed at time of surgery: 425 grams.  Patient would like to be approximately a small C cup.  Job: Works at [employer]  PMH Significant for: Heart murmur as a child, reports she had an echo which was was normal.  She reports she was recently tested for OSA, reports this was normal.  She has chronic migraines. She had echocardiogram September 2021, very slight mitral valve and tricuspid regurgitation.  Echo showed normal ejection fraction.  She reports no symptoms of this.  She has been feeling well lately.  No recent changes in her health.  Patient underwent screening mammogram prior to breast reduction surgery in October 2022 with subsequent ultrasound of her left breast on 08/17/2021, pathology revealed benign breast tissue with hyalinized fibroadenoma and pseudo angiomatous stromal hyperplasia.  Negative for atypia and malignancy.  Past Medical History: Allergies: No Known Allergies  Current Medications:  Current Outpatient Medications:    amphetamine-dextroamphetamine (ADDERALL) 20 MG tablet,  Take 1 tablet (20 mg total) by mouth 2 (two) times daily., Disp: 60 tablet, Rfl: 0   Azelaic Acid (FINACEA) 15 % gel, Apply 1 application topically 2 (two) times daily. After skin is thoroughly washed and patted dry, gently but thoroughly massage a thin film of azelaic acid cream into the affected area twice daily, in the morning and evening., Disp: 50 g, Rfl: 11   cephALEXin (KEFLEX) 500 MG capsule, Take 1 capsule (500 mg total) by mouth 4 (four) times daily for 3 days., Disp: 12 capsule, Rfl: 0   cetirizine (ZYRTEC) 10 MG tablet, Take 10 mg by mouth daily., Disp: , Rfl:    HYDROcodone-acetaminophen (NORCO) 5-325 MG tablet, Take 1 tablet by mouth every 6 (six) hours as needed for up to 5 days for severe pain., Disp: 20 tablet, Rfl: 0   ondansetron (ZOFRAN) 4 MG tablet, Take 1 tablet (4 mg total) by mouth every 8 (eight) hours as needed for nausea or vomiting., Disp: 20 tablet, Rfl: 0   propranolol ER (INDERAL LA) 60 MG 24 hr capsule, TAKE 1 CAPSULE BY MOUTH AT BEDTIME., Disp: 90 capsule, Rfl: 11   rizatriptan (MAXALT-MLT) 10 MG disintegrating tablet, TAKE 1 TABLET BY MOUTH AS NEEDED FOR MIGRAINE. MAY REPEAT IN 2 HOURS IF NEEDED, Disp: 9 tablet, Rfl: 11   Vitamin D, Ergocalciferol, (DRISDOL) 1.25 MG (50000 UNIT) CAPS capsule, TAKE 1 CAPSULE BY MOUTH EVERY 7 DAYS., Disp: 12 capsule, Rfl: 1  Past Medical Problems: Past Medical History:  Diagnosis Date  GERD (gastroesophageal reflux disease)    Heart murmur     Past Surgical History: Past Surgical History:  Procedure Laterality Date   breast biopsy Left 08/17/2021   u/s bx, ribbon clip, path pending   CESAREAN SECTION     2014    Social History: Social History   Socioeconomic History   Marital status: Married    Spouse name: Not on file   Number of children: 1   Years of education: Not on file   Highest education level: Not on file  Occupational History   Not on file  Tobacco Use   Smoking status: Never   Smokeless tobacco: Never   Vaping Use   Vaping Use: Never used  Substance and Sexual Activity   Alcohol use: Yes    Comment: ocassionally   Drug use: No   Sexual activity: Yes    Birth control/protection: None  Other Topics Concern   Not on file  Social History Narrative   Mom is patient is mine.   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         GNA:   Lives at home with family   Caffeine: about 4 cups/day   Right handed   Social Determinants of Health   Financial Resource Strain: Not on file  Food Insecurity: Not on file  Transportation Needs: Not on file  Physical Activity: Not on file  Stress: Not on file  Social Connections: Not on file  Intimate Partner Violence: Not on file    Family History: Family History  Problem Relation Age of Onset   Anemia Mother    Osteoporosis Mother    Heart disease Father    Migraines Sister    Breast cancer Maternal Aunt 36   Cancer Maternal Aunt        breast    Review of Systems: Review of Systems  Constitutional: Negative.   Respiratory: Negative.    Cardiovascular: Negative.   Gastrointestinal: Negative.   Neurological: Negative.    Physical Exam: Vital Signs BP (!) 141/89 (BP Location: Left Arm, Patient Position: Sitting, Cuff Size: Large)    Pulse 84    Ht 4' 11.5" (1.511 m)    Wt 173 lb 3.2 oz (78.6 kg)    PF 100 L/min    BMI 34.40 kg/m   Physical Exam  Constitutional:      General: Not in acute distress.    Appearance: Normal appearance. Not ill-appearing.  HENT:     Head: Normocephalic and atraumatic.  Eyes:     Pupils: Pupils are equal, round Neck:     Musculoskeletal: Normal range of motion.  Cardiovascular:     Rate and Rhythm: Normal rate    Pulses: Normal pulses.  Pulmonary:     Effort: Pulmonary effort is normal. No respiratory distress.  Musculoskeletal: Normal range of motion.  Skin:    General: Skin is warm and dry.     Findings: No erythema or rash.  Neurological:     General: No focal deficit present.      Mental Status: Alert and oriented to person, place, and time. Mental status is at baseline.     Motor: No weakness.  Psychiatric:        Mood and Affect: Mood normal.        Behavior: Behavior normal.    Assessment/Plan: The patient is scheduled for bilateral breast reduction with Dr. Marla Roe.  Risks, benefits, and alternatives of procedure discussed, questions answered and  consent obtained.    Smoking Status: Non-smoker; Counseling Given?  N/A Last Mammogram: Recent bilateral mammogram with subsequent ultrasound and biopsy of left breast; Results: Negative for malignancy or atypia  Caprini Score: 3, moderate; Risk Factors include: BMI greater than 25, and length of planned surgery. Recommendation for mechanical prophylaxis. Encourage early ambulation.   Pictures obtained: @consult   Post-op Rx sent to pharmacy: Norco, Zofran, Keflex  Patient was provided with the breast reduction and General Surgical Risk consent document and Pain Medication Agreement prior to their appointment.  They had adequate time to read through the risk consent documents and Pain Medication Agreement. We also discussed them in person together during this preop appointment. All of their questions were answered to their satisfaction.  Recommended calling if they have any further questions.  Risk consent form and Pain Medication Agreement to be scanned into patient's chart.  The risk that can be encountered with breast reduction were discussed and include the following but not limited to these:  Breast asymmetry, fluid accumulation, firmness of the breast, inability to breast feed, loss of nipple or areola, skin loss, decrease or no nipple sensation, fat necrosis of the breast tissue, bleeding, infection, healing delay.  There are risks of anesthesia, changes to skin sensation and injury to nerves or blood vessels.  The muscle can be temporarily or permanently injured.  You may have an allergic reaction to tape,  suture, glue, blood products which can result in skin discoloration, swelling, pain, skin lesions, poor healing.  Any of these can lead to the need for revisonal surgery or stage procedures.  A reduction has potential to interfere with diagnostic procedures.  Nipple or breast piercing can increase risks of infection.  This procedure is best done when the breast is fully developed.  Changes in the breast will continue to occur over time.  Pregnancy can alter the outcomes of previous breast reduction surgery, weight gain and weigh loss can also effect the long term appearance.   Electronically signed by: Carola Rhine Alexius Hangartner, PA-C 10/24/2021 3:36 PM

## 2021-10-25 ENCOUNTER — Ambulatory Visit: Payer: No Typology Code available for payment source | Admitting: Dermatology

## 2021-11-02 ENCOUNTER — Encounter (HOSPITAL_BASED_OUTPATIENT_CLINIC_OR_DEPARTMENT_OTHER): Payer: Self-pay | Admitting: Plastic Surgery

## 2021-11-02 ENCOUNTER — Other Ambulatory Visit: Payer: Self-pay

## 2021-11-15 ENCOUNTER — Other Ambulatory Visit: Payer: Self-pay

## 2021-11-15 ENCOUNTER — Ambulatory Visit (HOSPITAL_BASED_OUTPATIENT_CLINIC_OR_DEPARTMENT_OTHER): Payer: No Typology Code available for payment source | Admitting: Anesthesiology

## 2021-11-15 ENCOUNTER — Encounter (HOSPITAL_BASED_OUTPATIENT_CLINIC_OR_DEPARTMENT_OTHER)
Admission: RE | Disposition: A | Discharge status: Home / Self Care | Payer: Self-pay | Source: Ambulatory Visit | Attending: Plastic Surgery

## 2021-11-15 ENCOUNTER — Encounter (HOSPITAL_BASED_OUTPATIENT_CLINIC_OR_DEPARTMENT_OTHER): Payer: Self-pay | Admitting: Plastic Surgery

## 2021-11-15 ENCOUNTER — Ambulatory Visit (HOSPITAL_BASED_OUTPATIENT_CLINIC_OR_DEPARTMENT_OTHER)
Admission: RE | Admit: 2021-11-15 | Discharge: 2021-11-15 | Disposition: A | Discharge status: Home / Self Care | Payer: No Typology Code available for payment source | Source: Ambulatory Visit | Attending: Plastic Surgery | Admitting: Plastic Surgery

## 2021-11-15 DIAGNOSIS — D242 Benign neoplasm of left breast: Secondary | ICD-10-CM | POA: Diagnosis not present

## 2021-11-15 DIAGNOSIS — K219 Gastro-esophageal reflux disease without esophagitis: Secondary | ICD-10-CM | POA: Insufficient documentation

## 2021-11-15 DIAGNOSIS — M542 Cervicalgia: Secondary | ICD-10-CM | POA: Insufficient documentation

## 2021-11-15 DIAGNOSIS — N62 Hypertrophy of breast: Secondary | ICD-10-CM | POA: Insufficient documentation

## 2021-11-15 DIAGNOSIS — G43909 Migraine, unspecified, not intractable, without status migrainosus: Secondary | ICD-10-CM | POA: Diagnosis not present

## 2021-11-15 DIAGNOSIS — G8929 Other chronic pain: Secondary | ICD-10-CM | POA: Diagnosis not present

## 2021-11-15 DIAGNOSIS — M546 Pain in thoracic spine: Secondary | ICD-10-CM | POA: Diagnosis not present

## 2021-11-15 HISTORY — DX: Hyperlipidemia, unspecified: E78.5

## 2021-11-15 HISTORY — DX: Nausea with vomiting, unspecified: R11.2

## 2021-11-15 HISTORY — DX: Headache, unspecified: R51.9

## 2021-11-15 HISTORY — DX: Nausea with vomiting, unspecified: Z98.890

## 2021-11-15 HISTORY — PX: BREAST REDUCTION SURGERY: SHX8

## 2021-11-15 LAB — POCT PREGNANCY, URINE: Preg Test, Ur: NEGATIVE

## 2021-11-15 SURGERY — BREAST REDUCTION WITH LIPOSUCTION
Anesthesia: General | Site: Breast | Laterality: Bilateral

## 2021-11-15 MED ORDER — MIDAZOLAM HCL 2 MG/2ML IJ SOLN
INTRAMUSCULAR | Status: AC
  Filled 2021-11-15: qty 2

## 2021-11-15 MED ORDER — ACETAMINOPHEN 325 MG RE SUPP: 650.0000 mg | RECTAL | Status: DC | PRN

## 2021-11-15 MED ORDER — MIDAZOLAM HCL 5 MG/5ML IJ SOLN
INTRAMUSCULAR | Status: DC | PRN
  Administered 2021-11-15: 2 mg via INTRAVENOUS

## 2021-11-15 MED ORDER — ONDANSETRON HCL 4 MG/2ML IJ SOLN
INTRAMUSCULAR | Status: DC | PRN
  Administered 2021-11-15: 4 mg via INTRAVENOUS

## 2021-11-15 MED ORDER — CEFAZOLIN SODIUM-DEXTROSE 2-4 GM/100ML-% IV SOLN
2.0000 g | INTRAVENOUS | Status: AC
  Administered 2021-11-15: 2 g via INTRAVENOUS

## 2021-11-15 MED ORDER — LIDOCAINE-EPINEPHRINE 2 %-1:100000 IJ SOLN
INTRAMUSCULAR | Status: AC
  Filled 2021-11-15: qty 1

## 2021-11-15 MED ORDER — CEFAZOLIN SODIUM-DEXTROSE 2-4 GM/100ML-% IV SOLN
INTRAVENOUS | Status: AC
  Filled 2021-11-15: qty 100

## 2021-11-15 MED ORDER — LIDOCAINE-EPINEPHRINE 1 %-1:100000 IJ SOLN
INTRAMUSCULAR | Status: DC | PRN
  Administered 2021-11-15: 50 mL via INTRAMUSCULAR

## 2021-11-15 MED ORDER — FENTANYL CITRATE (PF) 100 MCG/2ML IJ SOLN
INTRAMUSCULAR | Status: AC
  Filled 2021-11-15: qty 2

## 2021-11-15 MED ORDER — OXYCODONE HCL 5 MG PO TABS: 5.0000 mg | ORAL_TABLET | ORAL | Status: DC | PRN

## 2021-11-15 MED ORDER — PROPOFOL 10 MG/ML IV BOLUS
INTRAVENOUS | Status: DC | PRN
  Administered 2021-11-15: 200 mg via INTRAVENOUS

## 2021-11-15 MED ORDER — LACTATED RINGERS IV SOLN: INTRAVENOUS | Status: DC

## 2021-11-15 MED ORDER — LIDOCAINE HCL 1 % IJ SOLN
INTRAVENOUS | Status: DC | PRN
  Administered 2021-11-15: 300 mL

## 2021-11-15 MED ORDER — LIDOCAINE HCL (CARDIAC) PF 100 MG/5ML IV SOSY
PREFILLED_SYRINGE | INTRAVENOUS | Status: DC | PRN
  Administered 2021-11-15: 100 mg via INTRAVENOUS

## 2021-11-15 MED ORDER — SODIUM CHLORIDE 0.9% FLUSH: 3.0000 mL | Freq: Two times a day (BID) | INTRAVENOUS | Status: DC

## 2021-11-15 MED ORDER — ACETAMINOPHEN 325 MG PO TABS: 650.0000 mg | ORAL_TABLET | ORAL | Status: DC | PRN

## 2021-11-15 MED ORDER — FENTANYL CITRATE (PF) 100 MCG/2ML IJ SOLN: 25.0000 ug | INTRAMUSCULAR | Status: DC | PRN

## 2021-11-15 MED ORDER — CHLORHEXIDINE GLUCONATE CLOTH 2 % EX PADS
6.0000 | MEDICATED_PAD | Freq: Once | CUTANEOUS | Status: DC
Start: 2021-11-15 — End: 2021-11-15

## 2021-11-15 MED ORDER — SODIUM CHLORIDE 0.9 % IV SOLN: 250.0000 mL | INTRAVENOUS | Status: DC | PRN

## 2021-11-15 MED ORDER — SCOPOLAMINE 1 MG/3DAYS TD PT72
MEDICATED_PATCH | TRANSDERMAL | Status: AC
  Filled 2021-11-15: qty 1

## 2021-11-15 MED ORDER — DEXAMETHASONE SODIUM PHOSPHATE 4 MG/ML IJ SOLN
INTRAMUSCULAR | Status: DC | PRN
  Administered 2021-11-15: 5 mg via INTRAVENOUS

## 2021-11-15 MED ORDER — LIDOCAINE HCL (PF) 1 % IJ SOLN
INTRAMUSCULAR | Status: AC
  Filled 2021-11-15: qty 60

## 2021-11-15 MED ORDER — FENTANYL CITRATE (PF) 100 MCG/2ML IJ SOLN
INTRAMUSCULAR | Status: DC | PRN
  Administered 2021-11-15: 100 ug via INTRAVENOUS
  Administered 2021-11-15 (×2): 50 ug via INTRAVENOUS

## 2021-11-15 MED ORDER — EPINEPHRINE PF 1 MG/ML IJ SOLN
INTRAMUSCULAR | Status: AC
  Filled 2021-11-15: qty 1

## 2021-11-15 MED ORDER — BUPIVACAINE HCL (PF) 0.25 % IJ SOLN
INTRAMUSCULAR | Status: AC
  Filled 2021-11-15: qty 30

## 2021-11-15 MED ORDER — EPHEDRINE SULFATE (PRESSORS) 50 MG/ML IJ SOLN
INTRAMUSCULAR | Status: DC | PRN
  Administered 2021-11-15: 15 mg via INTRAVENOUS

## 2021-11-15 MED ORDER — PHENYLEPHRINE HCL (PRESSORS) 10 MG/ML IV SOLN
INTRAVENOUS | Status: DC | PRN
Start: 2021-11-15 — End: 2021-11-15
  Administered 2021-11-15: 80 ug via INTRAVENOUS

## 2021-11-15 MED ORDER — HYDROMORPHONE HCL 1 MG/ML IJ SOLN
INTRAMUSCULAR | Status: AC
  Filled 2021-11-15: qty 1

## 2021-11-15 MED ORDER — SODIUM CHLORIDE 0.9% FLUSH: 3.0000 mL | INTRAVENOUS | Status: DC | PRN

## 2021-11-15 SURGICAL SUPPLY — 66 items
ADH SKN CLS APL DERMABOND .7 (GAUZE/BANDAGES/DRESSINGS) ×4
BAG DECANTER FOR FLEXI CONT (MISCELLANEOUS) ×3 IMPLANT
BINDER BREAST LRG (GAUZE/BANDAGES/DRESSINGS) ×2 IMPLANT
BINDER BREAST MEDIUM (GAUZE/BANDAGES/DRESSINGS) IMPLANT
BINDER BREAST XLRG (GAUZE/BANDAGES/DRESSINGS) IMPLANT
BINDER BREAST XXLRG (GAUZE/BANDAGES/DRESSINGS) IMPLANT
BIOPATCH RED 1 DISK 7.0 (GAUZE/BANDAGES/DRESSINGS) IMPLANT
BLADE HEX COATED 2.75 (ELECTRODE) ×3 IMPLANT
BLADE KNIFE PERSONA 10 (BLADE) ×12 IMPLANT
BLADE SURG 15 STRL LF DISP TIS (BLADE) ×1 IMPLANT
BLADE SURG 15 STRL SS (BLADE) ×3
CANISTER SUCT 1200ML W/VALVE (MISCELLANEOUS) ×3 IMPLANT
COVER BACK TABLE 60X90IN (DRAPES) ×3 IMPLANT
COVER MAYO STAND STRL (DRAPES) ×3 IMPLANT
DERMABOND ADVANCED (GAUZE/BANDAGES/DRESSINGS) ×2
DERMABOND ADVANCED .7 DNX12 (GAUZE/BANDAGES/DRESSINGS) ×4 IMPLANT
DRAIN CHANNEL 19F RND (DRAIN) IMPLANT
DRAPE LAPAROSCOPIC ABDOMINAL (DRAPES) ×3 IMPLANT
DRSG OPSITE POSTOP 4X6 (GAUZE/BANDAGES/DRESSINGS) ×3 IMPLANT
DRSG PAD ABDOMINAL 8X10 ST (GAUZE/BANDAGES/DRESSINGS) ×6 IMPLANT
ELECT BLADE 4.0 EZ CLEAN MEGAD (MISCELLANEOUS) ×3
ELECT REM PT RETURN 9FT ADLT (ELECTROSURGICAL) ×6
ELECTRODE BLDE 4.0 EZ CLN MEGD (MISCELLANEOUS) ×1 IMPLANT
ELECTRODE REM PT RTRN 9FT ADLT (ELECTROSURGICAL) ×3 IMPLANT
EVACUATOR SILICONE 100CC (DRAIN) IMPLANT
GAUZE SPONGE 4X4 12PLY STRL LF (GAUZE/BANDAGES/DRESSINGS) IMPLANT
GLOVE SURG ENC MOIS LTX SZ6.5 (GLOVE) ×13 IMPLANT
GLOVE SURG ENC TEXT LTX SZ7.5 (GLOVE) ×5 IMPLANT
GOWN STRL REUS W/ TWL LRG LVL3 (GOWN DISPOSABLE) ×7 IMPLANT
GOWN STRL REUS W/TWL LRG LVL3 (GOWN DISPOSABLE) ×12
NDL FILTER BLUNT 18X1 1/2 (NEEDLE) ×1 IMPLANT
NDL HYPO 25X1 1.5 SAFETY (NEEDLE) ×1 IMPLANT
NDL SAFETY ECLIPSE 18X1.5 (NEEDLE) IMPLANT
NEEDLE FILTER BLUNT 18X 1/2SAF (NEEDLE) ×1
NEEDLE FILTER BLUNT 18X1 1/2 (NEEDLE) ×2 IMPLANT
NEEDLE HYPO 18GX1.5 SHARP (NEEDLE)
NEEDLE HYPO 25X1 1.5 SAFETY (NEEDLE) ×3 IMPLANT
NS IRRIG 1000ML POUR BTL (IV SOLUTION) IMPLANT
PACK BASIN DAY SURGERY FS (CUSTOM PROCEDURE TRAY) ×3 IMPLANT
PAD ALCOHOL SWAB (MISCELLANEOUS) IMPLANT
PAD FOAM SILICONE BACKED (GAUZE/BANDAGES/DRESSINGS) ×2 IMPLANT
PENCIL SMOKE EVACUATOR (MISCELLANEOUS) ×5 IMPLANT
PIN SAFETY STERILE (MISCELLANEOUS) IMPLANT
SLEEVE SCD COMPRESS KNEE MED (STOCKING) ×3 IMPLANT
SPIKE FLUID TRANSFER (MISCELLANEOUS) IMPLANT
SPONGE T-LAP 18X18 ~~LOC~~+RFID (SPONGE) ×6 IMPLANT
STRIP SUTURE WOUND CLOSURE 1/2 (MISCELLANEOUS) ×6 IMPLANT
SUT MNCRL AB 4-0 PS2 18 (SUTURE) ×20 IMPLANT
SUT MON AB 3-0 SH 27 (SUTURE) ×18
SUT MON AB 3-0 SH27 (SUTURE) ×10 IMPLANT
SUT MON AB 5-0 PS2 18 (SUTURE) ×2 IMPLANT
SUT PDS 3-0 CT2 (SUTURE) ×12
SUT PDS II 3-0 CT2 27 ABS (SUTURE) ×4 IMPLANT
SUT SILK 3 0 PS 1 (SUTURE) ×2 IMPLANT
SYR 3ML 23GX1 SAFETY (SYRINGE) IMPLANT
SYR 50ML LL SCALE MARK (SYRINGE) ×2 IMPLANT
SYR BULB IRRIG 60ML STRL (SYRINGE) ×3 IMPLANT
SYR CONTROL 10ML LL (SYRINGE) ×3 IMPLANT
TAPE MEASURE VINYL STERILE (MISCELLANEOUS) IMPLANT
TOWEL GREEN STERILE FF (TOWEL DISPOSABLE) ×6 IMPLANT
TRAY DSU PREP LF (CUSTOM PROCEDURE TRAY) ×3 IMPLANT
TUBE CONNECTING 20X1/4 (TUBING) ×3 IMPLANT
TUBING INFILTRATION IT-10001 (TUBING) ×2 IMPLANT
TUBING SET GRADUATE ASPIR 12FT (MISCELLANEOUS) ×2 IMPLANT
UNDERPAD 30X36 HEAVY ABSORB (UNDERPADS AND DIAPERS) ×6 IMPLANT
YANKAUER SUCT BULB TIP NO VENT (SUCTIONS) ×3 IMPLANT

## 2021-11-15 NOTE — Anesthesia Preprocedure Evaluation (Signed)
Anesthesia Evaluation  Patient identified by MRN, date of birth, ID band Patient awake    Reviewed: Allergy & Precautions, H&P , NPO status , Patient's Chart, lab work & pertinent test results  History of Anesthesia Complications (+) PONV and history of anesthetic complications  Airway Mallampati: II   Neck ROM: full    Dental   Pulmonary sleep apnea ,    breath sounds clear to auscultation       Cardiovascular negative cardio ROS   Rhythm:regular Rate:Normal     Neuro/Psych  Headaches,    GI/Hepatic GERD  ,  Endo/Other    Renal/GU      Musculoskeletal   Abdominal   Peds  Hematology   Anesthesia Other Findings   Reproductive/Obstetrics                             Anesthesia Physical Anesthesia Plan  ASA: 2  Anesthesia Plan: General   Post-op Pain Management:    Induction: Intravenous  PONV Risk Score and Plan: 4 or greater and Ondansetron, Dexamethasone, Midazolam, Treatment may vary due to age or medical condition and Scopolamine patch - Pre-op  Airway Management Planned: Oral ETT  Additional Equipment:   Intra-op Plan:   Post-operative Plan: Extubation in OR  Informed Consent: I have reviewed the patients History and Physical, chart, labs and discussed the procedure including the risks, benefits and alternatives for the proposed anesthesia with the patient or authorized representative who has indicated his/her understanding and acceptance.     Dental advisory given  Plan Discussed with: CRNA, Surgeon and Anesthesiologist  Anesthesia Plan Comments:         Anesthesia Quick Evaluation

## 2021-11-15 NOTE — Transfer of Care (Signed)
Immediate Anesthesia Transfer of Care Note  Patient: Brooke Fox  Procedure(s) Performed: BREAST REDUCTION WITH LIPOSUCTION (Bilateral: Breast)  Patient Location: PACU  Anesthesia Type:General  Level of Consciousness: awake, alert  and oriented  Airway & Oxygen Therapy: Patient Spontanous Breathing and Patient connected to face mask oxygen  Post-op Assessment: Report given to RN and Post -op Vital signs reviewed and stable  Post vital signs: Reviewed and stable  Last Vitals:  Vitals Value Taken Time  BP    Temp    Pulse    Resp    SpO2      Last Pain:  Vitals:   11/15/21 0712  TempSrc: Oral  PainSc: 0-No pain      Patients Stated Pain Goal: 4 (44/83/01 5996)  Complications: No notable events documented.

## 2021-11-15 NOTE — Anesthesia Procedure Notes (Signed)
Procedure Name: LMA Insertion Date/Time: 11/15/2021 8:45 AM Performed by: Willa Frater, CRNA Pre-anesthesia Checklist: Patient identified, Emergency Drugs available, Suction available and Patient being monitored Patient Re-evaluated:Patient Re-evaluated prior to induction Oxygen Delivery Method: Circle system utilized Preoxygenation: Pre-oxygenation with 100% oxygen Induction Type: IV induction Ventilation: Mask ventilation without difficulty LMA: LMA inserted LMA Size: 4.0 Number of attempts: 1 Airway Equipment and Method: Bite block Placement Confirmation: positive ETCO2 Tube secured with: Tape Dental Injury: Teeth and Oropharynx as per pre-operative assessment

## 2021-11-15 NOTE — Discharge Instructions (Addendum)
INSTRUCTIONS FOR AFTER BREAST SURGERY   You will likely have some questions about what to expect following your operation.  The following information will help you and your family understand what to expect when you are discharged from the hospital.  Following these guidelines will help ensure a smooth recovery and reduce risks of complications.  Postoperative instructions include information on: diet, wound care, medications and physical activity.  AFTER SURGERY Expect to go home after the procedure.  In some cases, you may need to spend one night in the hospital for observation.  DIET Breast surgery does not require a specific diet.  However, the healthier you eat the better your body can start healing. It is important to increasing your protein intake.  This means limiting the foods with sugar and carbohydrates.  Focus on vegetables and some meat.  If you have any liposuction during your procedure be sure to drink water.  If your urine is bright yellow, then it is concentrated, and you need to drink more water.  As a general rule after surgery, you should have 8 ounces of water every hour while awake.  If you find you are persistently nauseated or unable to take in liquids let us know.  NO TOBACCO USE or EXPOSURE.  This will slow your healing process and increase the risk of a wound.  WOUND CARE Leave the ACE wrap or binder on for 3 days . Use fragrance free soap.   After 3 days you can remove the ACE wrap or binder to shower. Once dry apply ACE wrap, binder or sports bra.  Use a mild soap like Dial, Dove and [place]. You may have Topifoam or Lipofoam on.  It is soft and spongy and helps keep you from getting creases if you have liposuction.  This can be removed before the shower and then replaced.  If you need more it is available on [employer] (Lipofoam). If you have steri-strips / tape directly attached to your skin leave them in place. It is OK to get these wet.   No baths, pools or hot tubs for four  weeks. We close your incision to leave the smallest and best-looking scar. No ointment or creams on your incisions until given the go ahead.  Especially not Neosporin (Too many skin reactions with this one).  A few weeks after surgery you can use Mederma and start massaging the scar. We ask you to wear your binder or sports bra for the first 6 weeks around the clock, including while sleeping. This provides added comfort and helps reduce the fluid accumulation at the surgery site.  ACTIVITY No heavy lifting until cleared by the doctor.  This usually means no more than a half-gallon of milk.  It is OK to walk and climb stairs. In fact, moving your legs is very important to decrease your risk of a blood clot.  It will also help keep you from getting deconditioned.  Every 1 to 2 hours get up and walk for 5 minutes. This will help with a quicker recovery back to normal.  Let pain be your guide so you don't do too much.  This is not the time for spring cleaning and don't plan on taking care of anyone else.  This time is for you to recover,  You will be more comfortable if you sleep and rest with your head elevated either with a few pillows under you or in a recliner.  No stomach sleeping for a three months.  WORK Everyone  returns to work at different times. As a rough guide, most people take at least 1 - 2 weeks off prior to returning to work. If you need documentation for your job, bring the forms to your postoperative follow up visit.  DRIVING Arrange for someone to bring you home from the hospital.  You may be able to drive a few days after surgery but not while taking any narcotics or valium.  BOWEL MOVEMENTS Constipation can occur after anesthesia and while taking pain medication.  It is important to stay ahead for your comfort.  We recommend taking Milk of Magnesia (2 tablespoons; twice a day) while taking the pain pills.  MEDICATIONS You may be prescribed should start after surgery At your  preoperative visit for you history and physical you may have been given the following medications: An antibiotic: Start this medication when you get home and take according to the instructions on the bottle. Zofran 4 mg:  This is to treat nausea and vomiting.  You can take this every 6 hours as needed and only if needed. Valium 2 mg: This is for muscle tightness if you have an implant or expander. This will help relax your muscle which also helps with pain control.  This can be taken every 12 hours as needed. Don't drive after taking this medication. Norco (hydrocodone/acetaminophen) 5/325 mg:  This is only to be used after you have taken the motrin or the tylenol. Every 8 hours as needed.   Over the counter Medication to take: Ibuprofen (Motrin) 600 mg:  Take this every 6 hours.  If you have additional pain then take 500 mg of the tylenol every 8 hours.  Only take the Norco after you have tried these two. Miralax or stool softener of choice: Take this according to the bottle if you take the [place] Call your surgeon's office if any of the following occur: Fever 101 degrees F or greater Excessive bleeding or fluid from the incision site. Pain that increases over time without aid from the medications Redness, warmth, or pus draining from incision sites Persistent nausea or inability to take in liquids Severe misshapen area that underwent the operation.  Here are some resources:  Plastic surgery website: https://www.plasticsurgery.org/for-medical-professionals/education-and-resources/publications/breast-reconstruction-magazine Breast Reconstruction Awareness Campaign:  [employer] Plastic surgery Implant information:  https://www.plasticsurgery.org/patient-safety/breast-implant-safety    Post Anesthesia Home Care Instructions  Activity: Get plenty of rest for the remainder of the day. A responsible individual must stay with you for 24 hours following the  procedure.  For the next 24 hours, DO NOT: -Drive a car -Paediatric nurse -Drink alcoholic beverages -Take any medication unless instructed by your physician -Make any legal decisions or sign important papers.  Meals: Start with liquid foods such as gelatin or soup. Progress to regular foods as tolerated. Avoid greasy, spicy, heavy foods. If nausea and/or vomiting occur, drink only clear liquids until the nausea and/or vomiting subsides. Call your physician if vomiting continues.  Special Instructions/Symptoms: Your throat may feel dry or sore from the anesthesia or the breathing tube placed in your throat during surgery. If this causes discomfort, gargle with warm salt water. The discomfort should disappear within 24 hours.  If you had a scopolamine patch placed behind your ear for the management of post- operative nausea and/or vomiting:  1. The medication in the patch is effective for 72 hours, after which it should be removed.  Wrap patch in a tissue and discard in the trash. Wash hands thoroughly with soap and  water. 2. You may remove the patch earlier than 72 hours if you experience unpleasant side effects which may include dry mouth, dizziness or visual disturbances. 3. Avoid touching the patch. Wash your hands with soap and water after contact with the patch.

## 2021-11-15 NOTE — Interval H&P Note (Signed)
History and Physical Interval Note:  11/15/2021 7:57 AM  Brooke Fox  has presented today for surgery, with the diagnosis of Symptomatic mammary hypertrophy.  The various methods of treatment have been discussed with the patient and family. After consideration of risks, benefits and other options for treatment, the patient has consented to  Procedure(s) with comments: MAMMARY REDUCTION  (BREAST) (Bilateral) - 3 hours as a surgical intervention.  The patient's history has been reviewed, patient examined, no change in status, stable for surgery.  I have reviewed the patient's chart and labs.  Questions were answered to the patient's satisfaction.     Loel Lofty Kinzley Savell

## 2021-11-15 NOTE — Op Note (Addendum)
Breast Reduction Op note:    DATE OF PROCEDURE: 11/15/2021  LOCATION: [place]  SURGEON: Lyndee Leo Sanger Malyssa Maris, DO  ASSISTANT: Dr. Lennice Sites, Roetta Sessions, PA  PREOPERATIVE DIAGNOSIS 1. Macromastia 2. Neck Pain 3. Back Pain  POSTOPERATIVE DIAGNOSIS 1. Macromastia 2. Neck Pain 3. Back Pain  PROCEDURES 1. Bilateral breast reduction.  Right reduction 670 g, Left reduction 998 g  COMPLICATIONS: None.  DRAINS: none  INDICATIONS FOR PROCEDURE Brooke Fox is a 31 y.o. year-old female born on Mar 03, 1991,with a history of symptomatic macromastia with concominant back pain, neck pain, shoulder grooving from her bra.   MRN: [id number]  CONSENT Informed consent was obtained directly from the patient. The risks, benefits and alternatives were fully discussed. Specific risks including but not limited to bleeding, infection, hematoma, seroma, scarring, pain, nipple necrosis, asymmetry, poor cosmetic results, and need for further surgery were discussed. The patient had ample opportunity to have her questions answered to her satisfaction.  DESCRIPTION OF PROCEDURE  Patient was brought into the operating room and placed in a supine position.  SCDs were placed and appropriate padding was performed.  Antibiotics were given. The patient underwent general anesthesia and the chest was prepped and draped in a sterile fashion.  A timeout was performed and all information was confirmed to be correct. Tumescent was placed laterally in each breast.  Liposuction was done in the lateral breast area on each side.  Right side: Preoperative markings were confirmed.  Incision lines were injected with local with epinephrine.  After waiting for vasoconstriction, the marked lines were incised.  A Wise-pattern superomedial breast reduction was performed by de-epithelializing the pedicle, using bovie to create the superomedial pedicle, and removing breast tissue from the  superior, lateral, and inferior portions of the breast.  Care was taken to not undermine the breast pedicle. Hemostasis was achieved.  The nipple was gently rotated into position and the soft tissue closed with 4-0 Monocryl.   The pocket was irrigated and hemostasis confirmed.  The deep tissues were approximated with 3-0 PDS and 3-0 Monocryl sutures and the skin was closed with deep dermal and subcuticular 4-0 Monocryl sutures.  The nipple and skin flaps had good capillary refill at the end of the procedure.    Left side: Preoperative markings were confirmed.  Incision lines were injected with local with epinephrine.  After waiting for vasoconstriction, the marked lines were incised.  A Wise-pattern superomedial breast reduction was performed by de-epithelializing the pedicle, using bovie to create the superomedial pedicle, and removing breast tissue from the superior, lateral, and inferior portions of the breast.  Care was taken to not undermine the breast pedicle. Hemostasis was achieved.  The nipple was gently rotated into position and the soft tissue was closed with 4-0 Monocryl.  The patient was sat upright and size and shape symmetry was confirmed.  The pocket was irrigated and hemostasis confirmed.  The deep tissues were approximated with 3-0 PDS and 3-0 Monocryl sutures and the skin was closed with deep dermal and subcuticular 4-0 Monocryl sutures.  Dermabond was applied.  A breast binder and ABDs were placed.  The nipple and skin flaps had good capillary refill at the end of the procedure.  The patient tolerated the procedure well. The patient was allowed to wake from anesthesia and taken to the recovery room in satisfactory condition.  Dr. Erin Hearing assisted throughout the case.  Dr. Erin Hearing was essential in retraction and counter traction when needed to make the case progress  smoothly.  This retraction and assistance made it possible to see the tissue plans for the procedure.  The assistance was needed  for blood control, tissue re-approximation and assisted with closure of the incision site.

## 2021-11-16 ENCOUNTER — Encounter (HOSPITAL_BASED_OUTPATIENT_CLINIC_OR_DEPARTMENT_OTHER): Payer: Self-pay | Admitting: Plastic Surgery

## 2021-11-16 LAB — SURGICAL PATHOLOGY

## 2021-11-16 NOTE — Anesthesia Postprocedure Evaluation (Signed)
Anesthesia Post Note  Patient: Brooke Fox  Procedure(s) Performed: BREAST REDUCTION WITH LIPOSUCTION (Bilateral: Breast)     Patient location during evaluation: PACU Anesthesia Type: General Level of consciousness: awake and alert Pain management: pain level controlled Vital Signs Assessment: post-procedure vital signs reviewed and stable Respiratory status: spontaneous breathing, nonlabored ventilation, respiratory function stable and patient connected to nasal cannula oxygen Cardiovascular status: blood pressure returned to baseline and stable Postop Assessment: no apparent nausea or vomiting Anesthetic complications: no   No notable events documented.  Last Vitals:  Vitals:   11/15/21 1045 11/15/21 1147  BP: 131/79 133/85  Pulse: 85 64  Resp: 19 20  Temp:  36.6 C  SpO2: 97% 100%    Last Pain:  Vitals:   11/16/21 0857  TempSrc:   PainSc: [place]

## 2021-11-24 ENCOUNTER — Other Ambulatory Visit: Payer: Self-pay

## 2021-11-24 ENCOUNTER — Encounter: Payer: Self-pay | Admitting: Plastic Surgery

## 2021-11-24 ENCOUNTER — Ambulatory Visit (INDEPENDENT_AMBULATORY_CARE_PROVIDER_SITE_OTHER): Payer: No Typology Code available for payment source | Admitting: Plastic Surgery

## 2021-11-24 DIAGNOSIS — N62 Hypertrophy of breast: Secondary | ICD-10-CM

## 2021-11-24 NOTE — Progress Notes (Signed)
The patient is a 31 year old female here for follow-up after undergoing bilateral breast reduction.  Her dressing was a little wet so I removed the honeycomb.  Her Steri-Strips are still in place.  Bruising and swelling as expected.  She is extremely happy with her results.  Continue with splint as well.  Follow-up in 2 weeks and we will get pictures at that time.

## 2021-12-07 LAB — HM PAP SMEAR: HM Pap smear: NEGATIVE

## 2021-12-07 NOTE — Progress Notes (Signed)
31 year old female here for follow-up after bilateral breast reduction on 11/15/2021 with Dr. Marla Roe.  She is 3 weeks postop ? ?She reports overall she is doing well, reports that the left lateral breast has been quite tender and she has had a lot of burning and "turning" pain.  She has been wearing compression garments.  Due to the pain, she does not feel as if she will be able to perform her job duties. ? ?Chaperone present on exam ?On exam bilateral NAC's are viable, bilateral breast incisions are overall intact.  She does have a small wound at the T-junction of the right breast.  There is no erythema or cellulitic changes.  No subcutaneous fluid collection noted palpation. ? ? 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 ? ?Recommend following up in a few weeks for reevaluation.  Call with questions or concerns.  I do not see any signs of infection.  Vaseline and gauze to right breast wound. ?

## 2021-12-08 ENCOUNTER — Other Ambulatory Visit: Payer: Self-pay

## 2021-12-08 ENCOUNTER — Ambulatory Visit (INDEPENDENT_AMBULATORY_CARE_PROVIDER_SITE_OTHER): Payer: No Typology Code available for payment source | Admitting: Surgical

## 2021-12-08 ENCOUNTER — Encounter: Payer: Self-pay | Admitting: Surgical

## 2021-12-08 DIAGNOSIS — M542 Cervicalgia: Secondary | ICD-10-CM

## 2021-12-08 DIAGNOSIS — N62 Hypertrophy of breast: Secondary | ICD-10-CM

## 2021-12-08 NOTE — Telephone Encounter (Signed)
Can we send a letter to this patient via [place] or her email stating we will extend her FMLA until 12/25/2021? ? ?Let me know how I can help. ? ?[facility]

## 2021-12-11 ENCOUNTER — Encounter: Payer: No Typology Code available for payment source | Admitting: Family

## 2021-12-12 ENCOUNTER — Ambulatory Visit: Payer: No Typology Code available for payment source | Admitting: Neurology

## 2021-12-12 ENCOUNTER — Other Ambulatory Visit: Payer: Self-pay

## 2021-12-12 DIAGNOSIS — G43709 Chronic migraine without aura, not intractable, without status migrainosus: Secondary | ICD-10-CM | POA: Diagnosis not present

## 2021-12-12 NOTE — Progress Notes (Signed)
Botox- 200 units x 1 vial ?Lot: [id number] ?Expiration: 06/2024 ?[place]: [phone] ? ?Bacteriostatic 0.9% Sodium Chloride- 56m total ?Lot: [id number] ?Expiration: 05/02/2023 ?[place] [id number]? ?Dx: [id number]?B/B ? ?

## 2021-12-12 NOTE — Progress Notes (Signed)
Consent Form ?Botulism Toxin Injection For Chronic Migraine ? ? ?12/12/2021:  stable ? ?09/14/2021: stable and doing great ? ?April 06, 2021: Tremendous improvement, patient is extremely thrilled, greater than 90% improvement in migraine frequency and severity.  Today she did talk to me about her sleeping problems, she is very fatigued, snoring, morning headaches, referred her to sleep studies which was negative. Patient has untreated adhd, we can try Adderall and have her back for follow up to discuss ? ?+a. She has a heavy brow so I did the frontalis higher up, .  ? ?Reviewed orally with patient, additionally signature is on file: ? ?Botulism toxin has been approved by the Federal drug administration for treatment of chronic migraine. Botulism toxin does not cure chronic migraine and it may not be effective in some patients. ? ?The administration of botulism toxin is accomplished by injecting a small amount of toxin into the muscles of the neck and head. Dosage must be titrated for each individual. Any benefits resulting from botulism toxin tend to wear off after 3 months with a repeat injection required if benefit is to be maintained. Injections are usually done every 3-4 months with maximum effect peak achieved by about 2 or 3 weeks. Botulism toxin is expensive and you should be sure of what costs you will incur resulting from the injection. ? ?The side effects of botulism toxin use for chronic migraine may include: ? ? -Transient, and usually mild, facial weakness with facial injections ? -Transient, and usually mild, head or neck weakness with head/neck injections ? -Reduction or loss of forehead facial animation due to forehead muscle weakness ? -Eyelid drooping ? -Dry eye ? -Pain at the site of injection or bruising at the site of injection ? -Double vision ? -Potential unknown long term risks ? ?Contraindications: You should not have Botox if you are pregnant, nursing, allergic to albumin, have an infection,  skin condition, or muscle weakness at the site of the injection, or have myasthenia gravis, Lambert-Eaton syndrome, or ALS. ? ?It is also possible that as with any injection, there may be an allergic reaction or no effect from the medication. Reduced effectiveness after repeated injections is sometimes seen and rarely infection at the injection site may occur. All care will be taken to prevent these side effects. If therapy is given over a long time, atrophy and wasting in the muscle injected may occur. Occasionally the patient's become refractory to treatment because they develop antibodies to the toxin. In this event, therapy needs to be modified. ? ?I have read the above information and consent to the administration of botulism toxin. ? ? ? ?BOTOX PROCEDURE NOTE FOR MIGRAINE HEADACHE ? ? ? ?Contraindications and precautions discussed with patient(above). Aseptic procedure was observed and patient tolerated procedure. Procedure performed by Dr. Georgia Dom ? ?The condition has existed for more than 6 months, and pt does not have a diagnosis of ALS, Myasthenia Gravis or Lambert-Eaton Syndrome.  Risks and benefits of injections discussed and pt agrees to proceed with the procedure.  Written consent obtained ? ?These injections are medically necessary. Pt  receives good benefits from these injections. These injections do not cause sedations or hallucinations which the oral therapies may cause. ? ?Description of procedure: ? ?The patient was placed in a sitting position. The standard protocol was used for Botox as follows, with 5 units of Botox injected at each site: ? ? ?-Procerus muscle, midline injection ? ?-Corrugator muscle, bilateral injection ? ?-Frontalis muscle, bilateral injection, with  2 sites each side, medial injection was performed in the upper one third of the frontalis muscle, in the region vertical from the medial inferior edge of the superior orbital rim. The lateral injection was again in the upper  one third of the forehead vertically above the lateral limbus of the cornea, 1.5 cm lateral to the medial injection site. ? ?-Temporalis muscle injection, 4 sites, bilaterally. The first injection was 3 cm above the tragus of the ear, second injection site was 1.5 cm to 3 cm up from the first injection site in line with the tragus of the ear. The third injection site was 1.5-3 cm forward between the first 2 injection sites. The fourth injection site was 1.5 cm posterior to the second injection site.  ? ?-Occipitalis muscle injection, 3 sites, bilaterally. The first injection was done one half way between the occipital protuberance and the tip of the mastoid process behind the ear. The second injection site was done lateral and superior to the first, 1 fingerbreadth from the first injection. The third injection site was 1 fingerbreadth superiorly and medially from the first injection site. ? ?-Cervical paraspinal muscle injection, 2 sites, bilateral knee first injection site was 1 cm from the midline of the cervical spine, 3 cm inferior to the lower border of the occipital protuberance. The second injection site was 1.5 cm superiorly and laterally to the first injection site. ? ?-Trapezius muscle injection was performed at 3 sites, bilaterally. The first injection site was in the upper trapezius muscle halfway between the inflection point of the neck, and the acromion. The second injection site was one half way between the acromion and the first injection site. The third injection was done between the first injection site and the inflection point of the neck. ? ? ?Will return for repeat injection in 3 months. ? ? ?155 units of Botox was used, 45 Botox not injected was wasted. The patient tolerated the procedure well, there were no complications of the above procedure. ? ? ? ?

## 2021-12-29 ENCOUNTER — Ambulatory Visit (INDEPENDENT_AMBULATORY_CARE_PROVIDER_SITE_OTHER): Payer: No Typology Code available for payment source | Admitting: Surgical

## 2021-12-29 ENCOUNTER — Other Ambulatory Visit: Payer: Self-pay

## 2021-12-29 DIAGNOSIS — N62 Hypertrophy of breast: Secondary | ICD-10-CM

## 2021-12-29 DIAGNOSIS — M542 Cervicalgia: Secondary | ICD-10-CM

## 2021-12-29 NOTE — Progress Notes (Signed)
31 year old female here for follow-up after bilateral breast reduction with Dr. Marla Roe on 11/15/2021.  She is just over 6 weeks postop.  She is overall doing well, she has been applying Vaseline and gauze to the right breast wound.  This is going well.  She is not having any infectious symptoms.  She is back to work. ? ?She reports she has some collagen dressings at home that her sister used after her breast reduction, she would like to know if she can use this for her right breast wound. ? ?Chaperone present on exam ?On exam bilateral NAC's are viable, left breast incisions are intact and well-healed.  Right breast incisions are overall intact, she still has a wound at the junction of the inframammary fold and vertical limb that is approximately 8 x 8 mm.  No surrounding erythema or cellulitic changes.  No foul odor is noted.  Good base of granulation tissue noted. ? ?Recommend continue with Vaseline and gauze to the right breast wound, she can also use collagen if she would like.  No restrictions at this time.  We recommend following up in approximately 1 month for reevaluation.  Discussed with patient if the right breast wound heals well over the next few weeks and she is comfortable without additional follow-up that she can call and cancel.  Otherwise we will be happy to see her in 4 weeks. ? ?Pictures were obtained of the patient and placed in the chart with the patient's or guardian's permission. ? ?

## 2022-01-04 ENCOUNTER — Ambulatory Visit
Admission: EM | Admit: 2022-01-04 | Discharge: 2022-01-04 | Disposition: A | Discharge status: Home / Self Care | Payer: No Typology Code available for payment source | Attending: Internal Medicine | Admitting: Internal Medicine

## 2022-01-04 ENCOUNTER — Other Ambulatory Visit: Payer: Self-pay

## 2022-01-04 DIAGNOSIS — N12 Tubulo-interstitial nephritis, not specified as acute or chronic: Secondary | ICD-10-CM | POA: Diagnosis not present

## 2022-01-04 LAB — POCT URINALYSIS DIP (MANUAL ENTRY)
Bilirubin, UA: NEGATIVE
Glucose, UA: NEGATIVE mg/dL
Ketones, POC UA: NEGATIVE mg/dL
Nitrite, UA: POSITIVE — AB
Protein Ur, POC: 100 mg/dL — AB
Spec Grav, UA: 1.025 (ref 1.010–1.025)
Urobilinogen, UA: 0.2 E.U./dL
pH, UA: 6 (ref 5.0–8.0)

## 2022-01-04 MED ORDER — CIPROFLOXACIN HCL 500 MG PO TABS
500.0000 mg | ORAL_TABLET | Freq: Two times a day (BID) | ORAL | 0 refills | Status: DC
  Filled 2022-01-04: qty 14, 7d supply, fill #0

## 2022-01-04 MED ORDER — PHENAZOPYRIDINE HCL 200 MG PO TABS
200.0000 mg | ORAL_TABLET | Freq: Three times a day (TID) | ORAL | 0 refills | Status: DC
  Filled 2022-01-04: qty 6, 2d supply, fill #0

## 2022-01-04 NOTE — ED Provider Notes (Signed)
?[facility] ? ? ? ?CSN: [id number] ?Arrival date & time: 01/04/22  7517 ? ? ?  ? ?History   ?Chief Complaint ?Chief Complaint  ?Patient presents with  ? Flank Pain  ? Abdominal Pain  ? Urinary Retention  ? ? ?HPI ?Brooke Fox is a 31 y.o. female who presents with hx of L hematuria, urgency and bladder pressure x 4 days. L flank pain started yesterday. Denies nausea or fever. Has had pyelo in the past. LMP 3 weeks ago.  ? ? ? ?Past Medical History:  ?Diagnosis Date  ? GERD (gastroesophageal reflux disease)   ? Headache   ? Heart murmur   ? Hyperlipemia   ? pt states resolved.  ? PONV (postoperative nausea and vomiting)   ? ? ?Patient Active Problem List  ? Diagnosis Date Noted  ? Attention deficit disorder (ADD) without hyperactivity 09/12/2021  ? Symptomatic mammary hypertrophy 07/24/2021  ? Neck pain 07/24/2021  ? Excessive daytime sleepiness 06/13/2021  ? Morning headache 06/13/2021  ? OSA (obstructive sleep apnea) 06/13/2021  ? Class 2 obesity due to excess calories without serious comorbidity with body mass index (BMI) of 35.0 to 35.9 in adult 06/13/2021  ? Non-restorative sleep 06/13/2021  ? Chronic migraine without aura without status migrainosus, not intractable 10/30/2020  ? Headache 10/06/2020  ? Heart murmur 05/25/2020  ? Back pain 05/25/2020  ? Encounter to establish care 11/25/2019  ? Fatigue 11/25/2019  ? Vitamin D deficiency 06/04/2017  ? B12 deficiency 06/04/2017  ? Elevated cholesterol with elevated triglycerides 06/04/2017  ? ? ?Past Surgical History:  ?Procedure Laterality Date  ? breast biopsy Left 08/17/2021  ? u/s bx, ribbon clip, path pending  ? BREAST REDUCTION SURGERY Bilateral 11/15/2021  ? Procedure: BREAST REDUCTION WITH LIPOSUCTION;  Surgeon: Wallace Going, DO;  Location: [place];  Service: Plastics;  Laterality: Bilateral;  3 hours  ? CESAREAN SECTION    ? 2014  ? ? ?OB History   ? ? Gravida  ?1  ? Para  ?   ? Term  ?   ? Preterm  ?   ? AB  ?   ? Living   ?1  ?  ? ? SAB  ?   ? IAB  ?   ? Ectopic  ?   ? Multiple  ?   ? Live Births  ?1  ?   ?  ?  ? ? ? ?Home Medications   ? ?Prior to Admission medications   ?Medication Sig Start Date End Date Taking? Authorizing Provider  ?ciprofloxacin (CIPRO) 500 MG tablet Take 1 tablet (500 mg total) by mouth 2 (two) times daily. 01/04/22  Yes Rodriguez-Southworth, Sunday Spillers, PA-C  ?phenazopyridine (PYRIDIUM) 200 MG tablet Take 1 tablet (200 mg total) by mouth 3 (three) times daily. 01/04/22  Yes Rodriguez-Southworth, Sunday Spillers, PA-C  ?amphetamine-dextroamphetamine (ADDERALL) 20 MG tablet Take 1 tablet (20 mg total) by mouth 2 (two) times daily. 09/27/21   Melvenia Beam, MD  ?Azelaic Acid (FINACEA) 15 % gel Apply 1 application topically 2 (two) times daily. After skin is thoroughly washed and patted dry, gently but thoroughly massage a thin film of azelaic acid cream into the affected area twice daily, in the morning and evening. 05/17/21   Moye, [place], MD  ?cetirizine (ZYRTEC) 10 MG tablet Take 10 mg by mouth daily.    [provider]  ?Melatonin 10 MG CAPS Take by mouth.    [provider]  ?ondansetron (ZOFRAN) 4  MG tablet Take 1 tablet (4 mg total) by mouth every 8 (eight) hours as needed for nausea or vomiting. 10/24/21   Scheeler, Carola Rhine, PA-C  ?propranolol ER (INDERAL LA) 60 MG 24 hr capsule TAKE 1 CAPSULE BY MOUTH AT BEDTIME. 10/27/20 12/28/21  Melvenia Beam, MD  ?rizatriptan (MAXALT-MLT) 10 MG disintegrating tablet TAKE 1 TABLET BY MOUTH AS NEEDED FOR MIGRAINE. MAY REPEAT IN 2 HOURS IF NEEDED 10/27/20 10/29/21  Melvenia Beam, MD  ?Vitamin D, Ergocalciferol, (DRISDOL) 1.25 MG (50000 UNIT) CAPS capsule TAKE 1 CAPSULE BY MOUTH EVERY 7 DAYS. 02/07/21 02/07/22  Burnard Hawthorne, FNP  ? ? ?Family History ?Family History  ?Problem Relation Age of Onset  ? Anemia Mother   ? Osteoporosis Mother   ? Heart disease Father   ? Migraines Sister   ? Breast cancer Maternal Aunt 36  ? Cancer Maternal Aunt   ?     breast   ? ? ?Social History ?Social History  ? ?Tobacco Use  ? Smoking status: Never  ? Smokeless tobacco: Never  ?Vaping Use  ? Vaping Use: Never used  ?Substance Use Topics  ? Alcohol use: Yes  ?  Comment: ocassionally  ? Drug use: No  ? ? ? ?Allergies   ?Patient has no known allergies. ? ? ?Review of Systems ?Review of Systems  ?Constitutional:  Negative for chills and fever.  ?Genitourinary:  Positive for difficulty urinating, flank pain, frequency and urgency. Negative for dysuria.  ?Skin:  Negative for rash.  ? ? ?Physical Exam ?Triage Vital Signs ?ED Triage Vitals  ?Enc Vitals Group  ?   BP 01/04/22 0839 110/74  ?   Pulse Rate 01/04/22 0839 67  ?   Resp 01/04/22 0839 16  ?   Temp 01/04/22 0839 (!) 97.5 ?F (36.4 ?C)  ?   Temp Source 01/04/22 0839 Temporal  ?   SpO2 01/04/22 0839 98 %  ?   Weight --   ?   Height --   ?   Head Circumference --   ?   Peak Flow --   ?   Pain Score 01/04/22 0838 6  ?   Pain Loc --   ?   Pain Edu? --   ?   Excl. in [place]? --   ? ?No data found. ? ?Updated Vital Signs ?BP 110/74 (BP Location: Left Arm)   Pulse 67   Temp (!) 97.5 ?F (36.4 ?C) (Temporal)   Resp 16   SpO2 98%  ? ?Visual Acuity ?Right Eye Distance:   ?Left Eye Distance:   ?Bilateral Distance:   ? ?Right Eye Near:   ?Left Eye Near:    ?Bilateral Near:    ? ?Physical Exam ?Vitals and nursing note reviewed.  ?Constitutional:   ?   General: She is not in acute distress. ?   Appearance: She is not toxic-appearing.  ?HENT:  ?   Head: Normocephalic.  ?   Right Ear: External ear normal.  ?   Left Ear: External ear normal.  ?Eyes:  ?   General: No scleral icterus. ?   Conjunctiva/sclera: Conjunctivae normal.  ?Pulmonary:  ?   Effort: Pulmonary effort is normal.  ?Abdominal:  ?   General: Bowel sounds are normal.  ?   Palpations: Abdomen is soft. There is no mass.  ?   Tenderness: There is no guarding or rebound.  ?   Comments: + L CVA tenderness   ?Musculoskeletal:     ?  General: Normal range of motion.  ?   Cervical back: Neck  supple.  ?   Comments: BACK- has mild tenderness on area of complaint with palpation  ?Skin: ?   General: Skin is warm and dry.  ?   Findings: No rash.  ?Neurological:  ?   Mental Status: She is alert and oriented to person, place, and time.  ?   Gait: Gait normal.  ?Psychiatric:     ?   Mood and Affect: Mood normal.     ?   Behavior: Behavior normal.     ?   Thought Content: Thought content normal.     ?   Judgment: Judgment normal.  ? ?UC Treatments / Results  ?Labs ?(all labs ordered are listed, but only abnormal results are displayed) ?Labs Reviewed  ?POCT URINALYSIS DIP (MANUAL ENTRY) - Abnormal; Notable for the following components:  ?    Result Value  ? Clarity, UA cloudy (*)   ? Blood, UA moderate (*)   ? Protein Ur, POC =100 (*)   ? Nitrite, UA Positive (*)   ? Leukocytes, UA Large (3+) (*)   ? All other components within normal limits  ?URINE CULTURE  ? ? ?EKG ? ? ?Radiology ?No results found. ? ?Procedures ?Procedures (including critical care time) ? ?Medications Ordered in UC ?Medications - No data to display ? ?Initial Impression / Assessment and Plan / UC Course  ?I have reviewed the triage vital signs and the nursing notes. ? ?L pyelonephritis ? ?I placed her on Cipro and Pyridium as noted. Urine was sent out for a culture.  ?She denies pregnancy test.  ?Final Clinical Impressions(s) / UC Diagnoses  ? ?Final diagnoses:  ?Pyelonephritis  ? ? ? ?Discharge Instructions   ? ?  ?If you get worse in 48 hours, you need to be seen  ? ? ? ? ?ED Prescriptions   ? ? Medication Sig Dispense Auth. Provider  ? ciprofloxacin (CIPRO) 500 MG tablet Take 1 tablet (500 mg total) by mouth 2 (two) times daily. 14 tablet Rodriguez-Southworth, Sunday Spillers, PA-C  ? phenazopyridine (PYRIDIUM) 200 MG tablet Take 1 tablet (200 mg total) by mouth 3 (three) times daily. 6 tablet Rodriguez-Southworth, Sunday Spillers, PA-C  ? ?  ? ?PDMP not reviewed this encounter. ?  Shelby Mattocks, PA-C ?01/04/22 0277 ? ?

## 2022-01-04 NOTE — ED Triage Notes (Signed)
Patient presents to Urgent Care with complaints of left sided flank pain, hematuria, urinary urgency, and bladder pressure since Sunday. Pt concerned with possible kidney stone or UTI. Not treating with any meds.  ?

## 2022-01-04 NOTE — Discharge Instructions (Signed)
If you get worse in 48 hours, you need to be seen  ?

## 2022-01-07 LAB — URINE CULTURE
Culture: >=100000 — AB
Special Requests: NORMAL

## 2022-01-25 ENCOUNTER — Other Ambulatory Visit: Payer: Self-pay

## 2022-01-25 ENCOUNTER — Other Ambulatory Visit: Payer: Self-pay | Admitting: Neurology

## 2022-01-25 ENCOUNTER — Other Ambulatory Visit: Payer: Self-pay | Admitting: Family

## 2022-01-25 DIAGNOSIS — G47419 Narcolepsy without cataplexy: Secondary | ICD-10-CM

## 2022-01-25 DIAGNOSIS — F988 Other specified behavioral and emotional disorders with onset usually occurring in childhood and adolescence: Secondary | ICD-10-CM

## 2022-01-25 NOTE — Telephone Encounter (Signed)
Last seen 12/12/21 ?Next visit 03/14/22 ?Per [place] registry, last filled on 09/28/2021 #60/30. Rx refill sent to Dr Jaynee Eagles.  ?

## 2022-01-26 ENCOUNTER — Other Ambulatory Visit: Payer: Self-pay

## 2022-01-26 MED FILL — Amphetamine-Dextroamphetamine Tab 20 MG: ORAL | 30 days supply | Qty: 60 | Fill #0 | Status: AC

## 2022-02-01 ENCOUNTER — Ambulatory Visit: Payer: No Typology Code available for payment source | Admitting: Surgical

## 2022-02-12 ENCOUNTER — Ambulatory Visit (INDEPENDENT_AMBULATORY_CARE_PROVIDER_SITE_OTHER): Payer: No Typology Code available for payment source | Admitting: Family Medicine

## 2022-02-12 ENCOUNTER — Encounter: Payer: Self-pay | Admitting: Family Medicine

## 2022-02-12 VITALS — BP 110/70 | HR 76 | Temp 98.4°F | Ht 59.5 in | Wt 152.2 lb

## 2022-02-12 DIAGNOSIS — R1033 Periumbilical pain: Secondary | ICD-10-CM | POA: Diagnosis not present

## 2022-02-12 LAB — POCT URINALYSIS DIPSTICK
Bilirubin, UA: NEGATIVE
Blood, UA: NEGATIVE
Glucose, UA: NEGATIVE
Nitrite, UA: NEGATIVE
Protein, UA: NEGATIVE
Spec Grav, UA: <=1.005 — AB (ref 1.010–1.025)
Urobilinogen, UA: 2 E.U./dL — AB
pH, UA: 6 (ref 5.0–8.0)

## 2022-02-12 LAB — POCT URINE PREGNANCY: Preg Test, Ur: NEGATIVE

## 2022-02-12 NOTE — Assessment & Plan Note (Signed)
Patient's symptoms would seem most consistent with an abdominal wall strain based on her description.  Discussed there is some potential for appendicitis given the location of the discomfort and her tenderness though the intermittent nature of her pain would seem to make that less likely.  Her urine pregnancy test was negative.  We did check a UA given her prior urinalysis with positive blood on dipstick.  This was negative for blood.  Discussed checking CBC and sed rate to evaluate for infection and inflammation.  If either of those are elevated we would consider imaging her abdomen.  She was advised to monitor for any worsening symptoms and if they did worsen she should seek medical attention. ?

## 2022-02-12 NOTE — Patient Instructions (Addendum)
Nice to meet you. ?We will get some lab work today and contact you with the results. ?I suspect you have just strained your abdominal wall muscles.  Your labs will give me an idea of if we need to look into anything else as a potential cause. ?If you develop any worsening or severe symptoms please seek medical attention. ?

## 2022-02-12 NOTE — Progress Notes (Signed)
?Brooke Rumps, MD ?Phone: [phone] ? ?Brooke Fox is a 31 y.o. female who presents today for abdominal pain. ? ?Patient reports onset of symptoms on 02/08/2022.  Notes right mid/right lower quadrant abdominal discomfort that only occurs when she does certain movements.  Notes it is sudden onset and is quick offset pain.  Notes mild nausea associated with this.  No vomiting or diarrhea.  No fevers.  No hematuria, dysuria, frequency, or urgency.  No blood in her stool.  LMP was 01/31/2022.  She notes she just was treated for pyelonephritis.  She notes she has recovered from that. ? ?Social History  ? ?Tobacco Use  ?Smoking Status Never  ?Smokeless Tobacco Never  ? ? ?Current Outpatient Medications on File Prior to Visit  ?Medication Sig Dispense Refill  ? amphetamine-dextroamphetamine (ADDERALL) 20 MG tablet Take 1 tablet (20 mg total) by mouth 2 (two) times daily. 60 tablet 0  ? Azelaic Acid (FINACEA) 15 % gel Apply 1 application topically 2 (two) times daily. After skin is thoroughly washed and patted dry, gently but thoroughly massage a thin film of azelaic acid cream into the affected area twice daily, in the morning and evening. 50 g 11  ? cetirizine (ZYRTEC) 10 MG tablet Take 10 mg by mouth daily.    ? Melatonin 10 MG CAPS Take by mouth.    ? ondansetron (ZOFRAN) 4 MG tablet Take 1 tablet (4 mg total) by mouth every 8 (eight) hours as needed for nausea or vomiting. 20 tablet 0  ? propranolol ER (INDERAL LA) 60 MG 24 hr capsule TAKE 1 CAPSULE BY MOUTH AT BEDTIME. 90 capsule 11  ? rizatriptan (MAXALT-MLT) 10 MG disintegrating tablet TAKE 1 TABLET BY MOUTH AS NEEDED FOR MIGRAINE. MAY REPEAT IN 2 HOURS IF NEEDED 9 tablet 11  ? ?No current facility-administered medications on file prior to visit.  ? ? ? ?ROS see history of present illness ? ?Objective ? ?Physical Exam ?Vitals:  ? 02/12/22 1455  ?BP: 110/70  ?Pulse: 76  ?Temp: 98.4 ?F (36.9 ?C)  ?SpO2: 99%  ? ? ?BP Readings from Last 3 Encounters:  ?02/12/22  110/70  ?01/04/22 110/74  ?11/15/21 133/85  ? ?Wt Readings from Last 3 Encounters:  ?02/12/22 152 lb 3.2 oz (69 kg)  ?11/15/21 166 lb 10.7 oz (75.6 kg)  ?10/24/21 173 lb 3.2 oz (78.6 kg)  ? ? ?Physical Exam ?Constitutional:   ?   General: She is not in acute distress. ?   Appearance: She is not diaphoretic.  ?Pulmonary:  ?   Effort: Pulmonary effort is normal.  ?Abdominal:  ?   General: Bowel sounds are normal. There is no distension.  ?   Palpations: Abdomen is soft. There is no mass.  ?   Tenderness: There is no guarding or rebound.  ? ? ?Skin: ?   General: Skin is warm and dry.  ?Neurological:  ?   Mental Status: She is alert.  ? ? ? ?Assessment/Plan: Please see individual problem list. ? ?Problem List Items Addressed This Visit   ? ? Periumbilical abdominal pain - Primary  ?  Patient's symptoms would seem most consistent with an abdominal wall strain based on her description.  Discussed there is some potential for appendicitis given the location of the discomfort and her tenderness though the intermittent nature of her pain would seem to make that less likely.  Her urine pregnancy test was negative.  We did check a UA given her prior urinalysis with positive blood on  dipstick.  This was negative for blood.  Discussed checking CBC and sed rate to evaluate for infection and inflammation.  If either of those are elevated we would consider imaging her abdomen.  She was advised to monitor for any worsening symptoms and if they did worsen she should seek medical attention. ? ?  ?  ? Relevant Orders  ? POCT Pregnancy, Urine  ? POCT urine pregnancy (Completed)  ? POCT Urinalysis Dipstick (Completed)  ? CBC w/Diff  ? Sedimentation rate  ? ? ?Return if symptoms worsen or fail to improve. ? ? ?Brooke Rumps, MD ?[place] ? ?

## 2022-02-12 NOTE — Progress Notes (Deleted)
Poc

## 2022-02-13 ENCOUNTER — Other Ambulatory Visit: Payer: Self-pay | Admitting: Family Medicine

## 2022-02-13 DIAGNOSIS — D649 Anemia, unspecified: Secondary | ICD-10-CM

## 2022-02-13 LAB — CBC WITH DIFFERENTIAL/PLATELET
Basophils Absolute: 0.1 10*3/uL (ref 0.0–0.1)
Basophils Relative: 0.9 % (ref 0.0–3.0)
Eosinophils Absolute: 0.3 10*3/uL (ref 0.0–0.7)
Eosinophils Relative: 3.7 % (ref 0.0–5.0)
HCT: 32 % — ABNORMAL LOW (ref 36.0–46.0)
Hemoglobin: 11.3 g/dL — ABNORMAL LOW (ref 12.0–15.0)
Lymphocytes Relative: 25.2 % (ref 12.0–46.0)
Lymphs Abs: 2.2 10*3/uL (ref 0.7–4.0)
MCHC: 35.3 g/dL (ref 30.0–36.0)
MCV: 95.6 fl (ref 78.0–100.0)
Monocytes Absolute: 0.4 10*3/uL (ref 0.1–1.0)
Monocytes Relative: 4.5 % (ref 3.0–12.0)
Neutro Abs: 5.8 10*3/uL (ref 1.4–7.7)
Neutrophils Relative %: 65.7 % (ref 43.0–77.0)
Platelets: 246 10*3/uL (ref 150.0–400.0)
RBC: 3.35 Mil/uL — ABNORMAL LOW (ref 3.87–5.11)
RDW: 12.2 % (ref 11.5–15.5)
WBC: 8.9 10*3/uL (ref 4.0–10.5)

## 2022-02-13 LAB — SEDIMENTATION RATE: Sed Rate: 18 mm/hr (ref 0–20)

## 2022-02-14 ENCOUNTER — Encounter: Payer: Self-pay | Admitting: Plastic Surgery

## 2022-02-19 ENCOUNTER — Other Ambulatory Visit: Payer: Self-pay | Admitting: Neurology

## 2022-02-19 ENCOUNTER — Other Ambulatory Visit: Payer: Self-pay

## 2022-02-20 ENCOUNTER — Other Ambulatory Visit: Payer: Self-pay

## 2022-02-20 MED ORDER — PROPRANOLOL HCL ER 60 MG PO CP24
ORAL_CAPSULE | Freq: Every day | ORAL | 3 refills | Status: DC
  Filled 2022-02-20: qty 90, 90d supply, fill #0
  Filled 2022-06-21: qty 90, 90d supply, fill #1
  Filled 2022-10-23: qty 90, 90d supply, fill #2

## 2022-02-27 ENCOUNTER — Other Ambulatory Visit (INDEPENDENT_AMBULATORY_CARE_PROVIDER_SITE_OTHER): Payer: No Typology Code available for payment source

## 2022-02-27 DIAGNOSIS — D649 Anemia, unspecified: Secondary | ICD-10-CM | POA: Diagnosis not present

## 2022-02-28 LAB — IBC + FERRITIN
Ferritin: 56.1 ng/mL (ref 10.0–291.0)
Iron: 81 ug/dL (ref 42–145)
Saturation Ratios: 27.8 % (ref 20.0–50.0)
TIBC: 291.2 ug/dL (ref 250.0–450.0)
Transferrin: 208 mg/dL — ABNORMAL LOW (ref 212.0–360.0)

## 2022-02-28 LAB — CBC
HCT: 33.1 % — ABNORMAL LOW (ref 36.0–46.0)
Hemoglobin: 11.6 g/dL — ABNORMAL LOW (ref 12.0–15.0)
MCHC: 34.9 g/dL (ref 30.0–36.0)
MCV: 95.5 fl (ref 78.0–100.0)
Platelets: 308 10*3/uL (ref 150.0–400.0)
RBC: 3.47 Mil/uL — ABNORMAL LOW (ref 3.87–5.11)
RDW: 12.7 % (ref 11.5–15.5)
WBC: 9.6 10*3/uL (ref 4.0–10.5)

## 2022-03-13 ENCOUNTER — Ambulatory Visit: Payer: No Typology Code available for payment source | Admitting: Neurology

## 2022-03-13 DIAGNOSIS — G43709 Chronic migraine without aura, not intractable, without status migrainosus: Secondary | ICD-10-CM | POA: Diagnosis not present

## 2022-03-13 NOTE — Progress Notes (Signed)
Botox- 200 units x 1 vial Lot: [id number] Expiration: 11/2024 [facility]: 0023-3921-02  Bacteriostatic 0.9% Sodium Chloride- 4mL total Lot: [id number] Expiration: 05/02/2023 [facility]: 0409-1966-02  Dx: G43.709 B/B  

## 2022-03-13 NOTE — Progress Notes (Signed)
Consent Form Botulism Toxin Injection For Chronic Migraine   03/13/2022: stable doing great, nurtec prn works better than maxalt. > 90% improvement.   12/12/2021:  stable  09/14/2021: stable and doing great  April 06, 2021: Tremendous improvement, patient is extremely thrilled, greater than 90% improvement in migraine frequency and severity.  Today she did talk to me about her sleeping problems, she is very fatigued, snoring, morning headaches, referred her to sleep studies which was negative. Patient has untreated adhd, we can try Adderall and have her back for follow up to discuss  +a. She has a heavy brow so I did the frontalis higher up, .   Reviewed orally with patient, additionally signature is on file:  Botulism toxin has been approved by the Federal drug administration for treatment of chronic migraine. Botulism toxin does not cure chronic migraine and it may not be effective in some patients.  The administration of botulism toxin is accomplished by injecting a small amount of toxin into the muscles of the neck and head. Dosage must be titrated for each individual. Any benefits resulting from botulism toxin tend to wear off after 3 months with a repeat injection required if benefit is to be maintained. Injections are usually done every 3-4 months with maximum effect peak achieved by about 2 or 3 weeks. Botulism toxin is expensive and you should be sure of what costs you will incur resulting from the injection.  The side effects of botulism toxin use for chronic migraine may include:   -Transient, and usually mild, facial weakness with facial injections  -Transient, and usually mild, head or neck weakness with head/neck injections  -Reduction or loss of forehead facial animation due to forehead muscle weakness  -Eyelid drooping  -Dry eye  -Pain at the site of injection or bruising at the site of injection  -Double vision  -Potential unknown long term risks  Contraindications: You  should not have Botox if you are pregnant, nursing, allergic to albumin, have an infection, skin condition, or muscle weakness at the site of the injection, or have myasthenia gravis, Lambert-Eaton syndrome, or ALS.  It is also possible that as with any injection, there may be an allergic reaction or no effect from the medication. Reduced effectiveness after repeated injections is sometimes seen and rarely infection at the injection site may occur. All care will be taken to prevent these side effects. If therapy is given over a long time, atrophy and wasting in the muscle injected may occur. Occasionally the patient's become refractory to treatment because they develop antibodies to the toxin. In this event, therapy needs to be modified.  I have read the above information and consent to the administration of botulism toxin.    BOTOX PROCEDURE NOTE FOR MIGRAINE HEADACHE    Contraindications and precautions discussed with patient(above). Aseptic procedure was observed and patient tolerated procedure. Procedure performed by Dr. Georgia Dom  The condition has existed for more than 6 months, and pt does not have a diagnosis of ALS, Myasthenia Gravis or Lambert-Eaton Syndrome.  Risks and benefits of injections discussed and pt agrees to proceed with the procedure.  Written consent obtained  These injections are medically necessary. Pt  receives good benefits from these injections. These injections do not cause sedations or hallucinations which the oral therapies may cause.  Description of procedure:  The patient was placed in a sitting position. The standard protocol was used for Botox as follows, with 5 units of Botox injected at each site:   -  Procerus muscle, midline injection  -Corrugator muscle, bilateral injection  -Frontalis muscle, bilateral injection, with 2 sites each side, medial injection was performed in the upper one third of the frontalis muscle, in the region vertical from the  medial inferior edge of the superior orbital rim. The lateral injection was again in the upper one third of the forehead vertically above the lateral limbus of the cornea, 1.5 cm lateral to the medial injection site.  -Temporalis muscle injection, 4 sites, bilaterally. The first injection was 3 cm above the tragus of the ear, second injection site was 1.5 cm to 3 cm up from the first injection site in line with the tragus of the ear. The third injection site was 1.5-3 cm forward between the first 2 injection sites. The fourth injection site was 1.5 cm posterior to the second injection site.   -Occipitalis muscle injection, 3 sites, bilaterally. The first injection was done one half way between the occipital protuberance and the tip of the mastoid process behind the ear. The second injection site was done lateral and superior to the first, 1 fingerbreadth from the first injection. The third injection site was 1 fingerbreadth superiorly and medially from the first injection site.  -Cervical paraspinal muscle injection, 2 sites, bilateral knee first injection site was 1 cm from the midline of the cervical spine, 3 cm inferior to the lower border of the occipital protuberance. The second injection site was 1.5 cm superiorly and laterally to the first injection site.  -Trapezius muscle injection was performed at 3 sites, bilaterally. The first injection site was in the upper trapezius muscle halfway between the inflection point of the neck, and the acromion. The second injection site was one half way between the acromion and the first injection site. The third injection was done between the first injection site and the inflection point of the neck.   Will return for repeat injection in 3 months.   155 units of Botox was used, 45 Botox not injected was wasted. The patient tolerated the procedure well, there were no complications of the above procedure.

## 2022-03-29 ENCOUNTER — Other Ambulatory Visit: Payer: Self-pay | Admitting: Neurology

## 2022-03-29 ENCOUNTER — Other Ambulatory Visit: Payer: Self-pay

## 2022-03-29 DIAGNOSIS — G47419 Narcolepsy without cataplexy: Secondary | ICD-10-CM

## 2022-03-29 DIAGNOSIS — F988 Other specified behavioral and emotional disorders with onset usually occurring in childhood and adolescence: Secondary | ICD-10-CM

## 2022-03-29 NOTE — Telephone Encounter (Signed)
Last visit: 03/13/22 Next visit: 06/12/22 Per [place] registry, last filled on 01/26/2022 #60/30. Rx request sent to Dr Jaynee Eagles.

## 2022-04-02 MED FILL — Amphetamine-Dextroamphetamine Tab 20 MG: ORAL | 30 days supply | Qty: 60 | Fill #0 | Status: AC

## 2022-04-02 NOTE — Telephone Encounter (Signed)
Last OV was on 03/13/22.  Next OV is scheduled for 06/12/22.  Last RX was written on 01/26/22 for 60 tabs.   [facility] [facility] Database has been reviewed. Please e-scribe as work in MD.

## 2022-04-04 ENCOUNTER — Other Ambulatory Visit: Payer: Self-pay

## 2022-04-08 ENCOUNTER — Other Ambulatory Visit: Payer: Self-pay

## 2022-05-09 ENCOUNTER — Other Ambulatory Visit: Payer: Self-pay | Admitting: Neurology

## 2022-05-09 DIAGNOSIS — F988 Other specified behavioral and emotional disorders with onset usually occurring in childhood and adolescence: Secondary | ICD-10-CM

## 2022-05-09 DIAGNOSIS — G47419 Narcolepsy without cataplexy: Secondary | ICD-10-CM

## 2022-05-10 ENCOUNTER — Other Ambulatory Visit: Payer: Self-pay

## 2022-05-10 ENCOUNTER — Other Ambulatory Visit: Payer: Self-pay | Admitting: Neurology

## 2022-05-10 DIAGNOSIS — F988 Other specified behavioral and emotional disorders with onset usually occurring in childhood and adolescence: Secondary | ICD-10-CM

## 2022-05-10 DIAGNOSIS — G47419 Narcolepsy without cataplexy: Secondary | ICD-10-CM

## 2022-05-10 NOTE — Telephone Encounter (Addendum)
Last visit 03/13/22 Next visit 06/12/22 Per [place] registry, last filled on 04/04/2022 #60/30. Rx refill sent to work-in provider, Dr Leonie Man, since Dr Jaynee Eagles is out of the office.

## 2022-05-11 ENCOUNTER — Other Ambulatory Visit: Payer: Self-pay

## 2022-05-14 ENCOUNTER — Other Ambulatory Visit: Payer: Self-pay

## 2022-05-14 MED FILL — Amphetamine-Dextroamphetamine Tab 20 MG: ORAL | 30 days supply | Qty: 60 | Fill #0 | Status: AC

## 2022-05-15 ENCOUNTER — Other Ambulatory Visit: Payer: Self-pay

## 2022-05-21 ENCOUNTER — Ambulatory Visit (INDEPENDENT_AMBULATORY_CARE_PROVIDER_SITE_OTHER): Payer: No Typology Code available for payment source | Admitting: Family

## 2022-05-21 ENCOUNTER — Other Ambulatory Visit: Payer: Self-pay

## 2022-05-21 ENCOUNTER — Encounter: Payer: Self-pay | Admitting: Family

## 2022-05-21 VITALS — BP 118/64 | HR 86 | Temp 97.4°F | Ht 59.5 in | Wt 138.8 lb

## 2022-05-21 DIAGNOSIS — Z0000 Encounter for general adult medical examination without abnormal findings: Secondary | ICD-10-CM | POA: Diagnosis not present

## 2022-05-21 DIAGNOSIS — F419 Anxiety disorder, unspecified: Secondary | ICD-10-CM | POA: Diagnosis not present

## 2022-05-21 DIAGNOSIS — F32A Depression, unspecified: Secondary | ICD-10-CM | POA: Insufficient documentation

## 2022-05-21 MED ORDER — SERTRALINE HCL 50 MG PO TABS
50.0000 mg | ORAL_TABLET | Freq: Every day | ORAL | 3 refills | Status: DC
  Filled 2022-05-21: qty 90, 90d supply, fill #0

## 2022-05-21 MED ORDER — HYDROXYZINE HCL 10 MG PO TABS
10.0000 mg | ORAL_TABLET | Freq: Two times a day (BID) | ORAL | 1 refills | Status: DC | PRN
  Filled 2022-05-21: qty 30, 15d supply, fill #0

## 2022-05-21 NOTE — Progress Notes (Signed)
Subjective:    Patient ID: Brooke Fox, female    DOB: [birth year], 31 y.o.   MRN: [id number]  CC: Brooke Fox is a 31 y.o. female who presents today for physical exam.    HPI: Over the past 3 months has had increased depression and anxiety.  She found out that her husband is leaving her.  She is devastated.  She is sad for her 91 year old son.  She is tearful.  Increased anxiety at time.   She has trouble falling asleep.  Starting to see a counselor next week.  She has family support and supportive colleagues at work.  No si/ hi       Colorectal Cancer Screening: No first-degree relatives with colon cancer Breast Cancer Screening: No first-degree relatives with breast cancer. She had baseline mammogram prior to breast reduction  Cervical Cancer Screening: Dr Leafy Ro 12/07/21         Tetanus - UTD      Hepatitis C screening - Candidate for, declines HIV Screening- Candidate for , declines Labs: Screening labs today. Exercise: Gets regular exercise.   Alcohol use:  occassional Smoking/tobacco use: Nonsmoker.     HISTORY:  Past Medical History:  Diagnosis Date  . GERD (gastroesophageal reflux disease)   . Headache   . Heart murmur   . Hyperlipemia    pt states resolved.  Marland Kitchen PONV (postoperative nausea and vomiting)     Past Surgical History:  Procedure Laterality Date  . breast biopsy Left 08/17/2021   u/s bx, ribbon clip, path pending  . BREAST REDUCTION SURGERY Bilateral 11/15/2021   Procedure: BREAST REDUCTION WITH LIPOSUCTION;  Surgeon: Wallace Going, DO;  Location: [facility];  Service: Plastics;  Laterality: Bilateral;  3 hours  . CESAREAN SECTION     2014   Family History  Problem Relation Age of Onset  . Anemia Mother   . Osteoporosis Mother   . Heart disease Father   . Migraines Sister   . Breast cancer Maternal Aunt 36  . Cancer Maternal Aunt        breast      ALLERGIES: Patient has no known allergies.  Current  Outpatient Medications on File Prior to Visit  Medication Sig Dispense Refill  . amphetamine-dextroamphetamine (ADDERALL) 20 MG tablet Take 1 tablet (20 mg total) by mouth 2 (two) times daily. 60 tablet 0  . Azelaic Acid (FINACEA) 15 % gel Apply 1 application topically 2 (two) times daily. After skin is thoroughly washed and patted dry, gently but thoroughly massage a thin film of azelaic acid cream into the affected area twice daily, in the morning and evening. 50 g 11  . cetirizine (ZYRTEC) 10 MG tablet Take 10 mg by mouth daily.    . Galcanezumab-gnlm (EMGALITY) 120 MG/ML SOAJ Inject 120 mg into the skin every 30 (thirty) days.    . Melatonin 10 MG CAPS Take by mouth.    . ondansetron (ZOFRAN) 4 MG tablet Take 1 tablet (4 mg total) by mouth every 8 (eight) hours as needed for nausea or vomiting. 20 tablet 0  . propranolol ER (INDERAL LA) 60 MG 24 hr capsule TAKE 1 CAPSULE BY MOUTH AT BEDTIME. 90 capsule 3  . Rimegepant Sulfate (NURTEC) 75 MG TBDP Take 75 mg by mouth as needed.    . rizatriptan (MAXALT-MLT) 10 MG disintegrating tablet TAKE 1 TABLET BY MOUTH AS NEEDED FOR MIGRAINE. MAY REPEAT IN 2 HOURS IF NEEDED 9 tablet 11  No current facility-administered medications on file prior to visit.    Social History   Tobacco Use  . Smoking status: Never  . Smokeless tobacco: Never  Vaping Use  . Vaping Use: Never used  Substance Use Topics  . Alcohol use: Yes    Comment: ocassionally  . Drug use: No    Review of Systems  Constitutional:  Negative for chills, fever and unexpected weight change.  HENT:  Negative for congestion.   Respiratory:  Negative for cough.   Cardiovascular:  Negative for chest pain, palpitations and leg swelling.  Gastrointestinal:  Negative for nausea and vomiting.  Musculoskeletal:  Negative for arthralgias and myalgias.  Skin:  Negative for rash.  Neurological:  Negative for headaches.  Hematological:  Negative for adenopathy.  Psychiatric/Behavioral:   Positive for sleep disturbance. Negative for confusion and suicidal ideas. The patient is nervous/anxious.       Objective:    BP 118/64 (BP Location: Right Arm, Patient Position: Sitting, Cuff Size: Normal)   Pulse 86   Temp (!) 97.4 F (36.3 C) (Oral)   Ht 4' 11.5" (1.511 m)   Wt 138 lb 12.8 oz (63 kg)   LMP 05/02/2022 (Exact Date)   SpO2 99%   BMI 27.57 kg/m   BP Readings from Last 3 Encounters:  05/21/22 118/64  02/12/22 110/70  01/04/22 110/74   Wt Readings from Last 3 Encounters:  05/21/22 138 lb 12.8 oz (63 kg)  02/12/22 152 lb 3.2 oz (69 kg)  11/15/21 166 lb 10.7 oz (75.6 kg)    Physical Exam Vitals reviewed.  Constitutional:      Appearance: Normal appearance. She is well-developed.  Eyes:     Conjunctiva/sclera: Conjunctivae normal.  Neck:     Thyroid: No thyroid mass or thyromegaly.  Cardiovascular:     Rate and Rhythm: Normal rate and regular rhythm.     Pulses: Normal pulses.     Heart sounds: Normal heart sounds.  Pulmonary:     Effort: Pulmonary effort is normal.     Breath sounds: Normal breath sounds. No wheezing, rhonchi or rales.  Chest:  Breasts:    Breasts are symmetrical.     Right: No inverted nipple, mass, nipple discharge, skin change or tenderness.     Left: No inverted nipple, mass, nipple discharge, skin change or tenderness.  Abdominal:     General: Bowel sounds are normal. There is no distension.     Palpations: Abdomen is soft. Abdomen is not rigid. There is no fluid wave or mass.     Tenderness: There is no abdominal tenderness. There is no guarding or rebound.  Lymphadenopathy:     Head:     Right side of head: No submental, submandibular, tonsillar, preauricular, posterior auricular or occipital adenopathy.     Left side of head: No submental, submandibular, tonsillar, preauricular, posterior auricular or occipital adenopathy.     Cervical: No cervical adenopathy.     Right cervical: No superficial, deep or posterior cervical  adenopathy.    Left cervical: No superficial, deep or posterior cervical adenopathy.  Skin:    General: Skin is warm and dry.  Neurological:     Mental Status: She is alert.  Psychiatric:        Speech: Speech normal.        Behavior: Behavior normal.        Thought Content: Thought content normal.       Assessment & Plan:   Problem List Items Addressed This  Visit       Other   Annual physical exam - Primary    Clinical breast exam performed today.  Deferred pelvic exam the absence of complaints and Pap smear was done by GYN.      Relevant Orders   CBC with Differential/Platelet   Comprehensive metabolic panel   Hemoglobin A1c   Lipid panel   TSH   VITAMIN D 25 Hydroxy (Vit-D Deficiency, Fractures)   Anxiety and depression    New, uncontrolled. Start zoloft '50mg'$ .  I have also provided her with hydroxyzine '10mg'$  to use as needed anxiety.  She will let me know how she is doing      Relevant Medications   sertraline (ZOLOFT) 50 MG tablet   hydrOXYzine (ATARAX) 10 MG tablet     I am having Gerald C. Russon start on sertraline and hydrOXYzine. I am also having her maintain her cetirizine, rizatriptan, Azelaic Acid, ondansetron, Melatonin, propranolol ER, Emgality, Nurtec, and amphetamine-dextroamphetamine.   Meds ordered this encounter  Medications  . sertraline (ZOLOFT) 50 MG tablet    Sig: Take 1 tablet (50 mg total) by mouth at bedtime.    Dispense:  90 tablet    Refill:  3    Order Specific Question:   Supervising Provider    Answer:   Deborra Medina L [[id number]]  . hydrOXYzine (ATARAX) 10 MG tablet    Sig: Take 1 tablet (10 mg total) by mouth 2 (two) times daily as needed for anxiety.    Dispense:  30 tablet    Refill:  1    Order Specific Question:   Supervising Provider    Answer:   Crecencio Mc [[id number]]    Return precautions given.   Risks, benefits, and alternatives of the medications and treatment plan prescribed today were discussed, and patient expressed  understanding.   Education regarding symptom management and diagnosis given to patient on AVS.   Continue to follow with Burnard Hawthorne, FNP for routine health maintenance.   Brooke Fox and I agreed with plan.   Mable Paris, FNP

## 2022-05-21 NOTE — Assessment & Plan Note (Addendum)
New, uncontrolled. Start zoloft '50mg'$ .  I have also provided her with hydroxyzine '10mg'$  to use as needed anxiety.  She will let me know how she is doing

## 2022-05-21 NOTE — Assessment & Plan Note (Signed)
Clinical breast exam performed today.  Deferred pelvic exam the absence of complaints and Pap smear was done by GYN.

## 2022-05-21 NOTE — Patient Instructions (Signed)
Start Zoloft 80 mg at bedtime.  I also sent in hydroxyzine 10 mg to use as needed for anxiety.  Remember this medication can be sedating.  Please let me know how I can support you.  Health Maintenance, Female Adopting a healthy lifestyle and getting preventive care are important in promoting health and wellness. Ask your health care provider about: The right schedule for you to have regular tests and exams. Things you can do on your own to prevent diseases and keep yourself healthy. What should I know about diet, weight, and exercise? Eat a healthy diet  Eat a diet that includes plenty of vegetables, fruits, low-fat dairy products, and lean protein. Do not eat a lot of foods that are high in solid fats, added sugars, or sodium. Maintain a healthy weight Body mass index (BMI) is used to identify weight problems. It estimates body fat based on height and weight. Your health care provider can help determine your BMI and help you achieve or maintain a healthy weight. Get regular exercise Get regular exercise. This is one of the most important things you can do for your health. Most adults should: Exercise for at least 150 minutes each week. The exercise should increase your heart rate and make you sweat (moderate-intensity exercise). Do strengthening exercises at least twice a week. This is in addition to the moderate-intensity exercise. Spend less time sitting. Even light physical activity can be beneficial. Watch cholesterol and blood lipids Have your blood tested for lipids and cholesterol at 33 years of age, then have this test every 5 years. Have your cholesterol levels checked more often if: Your lipid or cholesterol levels are high. You are older than 33 years of age. You are at high risk for heart disease. What should I know about cancer screening? Depending on your health history and family history, you may need to have cancer screening at various ages. This may include screening  for: Breast cancer. Cervical cancer. Colorectal cancer. Skin cancer. Lung cancer. What should I know about heart disease, diabetes, and high blood pressure? Blood pressure and heart disease High blood pressure causes heart disease and increases the risk of stroke. This is more likely to develop in people who have high blood pressure readings or are overweight. Have your blood pressure checked: Every 3-5 years if you are 39-69 years of age. Every year if you are 81 years old or older. Diabetes Have regular diabetes screenings. This checks your fasting blood sugar level. Have the screening done: Once every three years after age 60 if you are at a normal weight and have a low risk for diabetes. More often and at a younger age if you are overweight or have a high risk for diabetes. What should I know about preventing infection? Hepatitis B If you have a higher risk for hepatitis B, you should be screened for this virus. Talk with your health care provider to find out if you are at risk for hepatitis B infection. Hepatitis C Testing is recommended for: Everyone born from [birth year] through 1967. Anyone with known risk factors for hepatitis C. Sexually transmitted infections (STIs) Get screened for STIs, including gonorrhea and chlamydia, if: You are sexually active and are younger than 33 years of age. You are older than 33 years of age and your health care provider tells you that you are at risk for this type of infection. Your sexual activity has changed since you were last screened, and you are at increased risk for chlamydia or  gonorrhea. Ask your health care provider if you are at risk. Ask your health care provider about whether you are at high risk for HIV. Your health care provider may recommend a prescription medicine to help prevent HIV infection. If you choose to take medicine to prevent HIV, you should first get tested for HIV. You should then be tested every 3 months for as long as you  are taking the medicine. Pregnancy If you are about to stop having your period (premenopausal) and you may become pregnant, seek counseling before you get pregnant. Take 400 to 800 micrograms (mcg) of folic acid every day if you become pregnant. Ask for birth control (contraception) if you want to prevent pregnancy. Osteoporosis and menopause Osteoporosis is a disease in which the bones lose minerals and strength with aging. This can result in bone fractures. If you are 34 years old or older, or if you are at risk for osteoporosis and fractures, ask your health care provider if you should: Be screened for bone loss. Take a calcium or vitamin D supplement to lower your risk of fractures. Be given hormone replacement therapy (HRT) to treat symptoms of menopause. Follow these instructions at home: Alcohol use Do not drink alcohol if: Your health care provider tells you not to drink. You are pregnant, may be pregnant, or are planning to become pregnant. If you drink alcohol: Limit how much you have to: 0-1 drink a day. Know how much alcohol is in your drink. In the [place] one drink equals one 12 oz bottle of beer (355 mL), one 5 oz glass of wine (148 mL), or one 1 oz glass of hard liquor (44 mL). Lifestyle Do not use any products that contain nicotine or tobacco. These products include cigarettes, chewing tobacco, and vaping devices, such as e-cigarettes. If you need help quitting, ask your health care provider. Do not use street drugs. Do not share needles. Ask your health care provider for help if you need support or information about quitting drugs. General instructions Schedule regular health, dental, and eye exams. Stay current with your vaccines. Tell your health care provider if: You often feel depressed. You have ever been abused or do not feel safe at home. Summary Adopting a healthy lifestyle and getting preventive care are important in promoting health and wellness. Follow your  health care provider's instructions about healthy diet, exercising, and getting tested or screened for diseases. Follow your health care provider's instructions on monitoring your cholesterol and blood pressure. This information is not intended to replace advice given to you by your health care provider. Make sure you discuss any questions you have with your health care provider. Document Revised: 02/06/2021 Document Reviewed: 02/06/2021 Elsevier Patient Education  [place].

## 2022-05-22 LAB — COMPREHENSIVE METABOLIC PANEL
ALT: 14 U/L (ref 0–35)
AST: 15 U/L (ref 0–37)
Albumin: 4.3 g/dL (ref 3.5–5.2)
Alkaline Phosphatase: 70 U/L (ref 39–117)
BUN: 4 mg/dL — ABNORMAL LOW (ref 6–23)
CO2: 24 mEq/L (ref 19–32)
Calcium: 9.2 mg/dL (ref 8.4–10.5)
Chloride: 101 mEq/L (ref 96–112)
Creatinine, Ser: 0.64 mg/dL (ref 0.40–1.20)
GFR: 116.02 mL/min (ref >60.00)
Glucose, Bld: 83 mg/dL (ref 70–99)
Potassium: 3.6 mEq/L (ref 3.5–5.1)
Sodium: 137 mEq/L (ref 135–145)
Total Bilirubin: 0.7 mg/dL (ref 0.2–1.2)
Total Protein: 6.6 g/dL (ref 6.0–8.3)

## 2022-05-22 LAB — LIPID PANEL
Cholesterol: 161 mg/dL (ref 0–200)
HDL: 39.6 mg/dL (ref >39.00)
LDL Cholesterol: 101 mg/dL — ABNORMAL HIGH (ref 0–99)
NonHDL: 121.59
Total CHOL/HDL Ratio: 4
Triglycerides: 103 mg/dL (ref 0.0–149.0)
VLDL: 20.6 mg/dL (ref 0.0–40.0)

## 2022-05-22 LAB — CBC WITH DIFFERENTIAL/PLATELET
Basophils Absolute: 0.1 10*3/uL (ref 0.0–0.1)
Basophils Relative: 1 % (ref 0.0–3.0)
Eosinophils Absolute: 0.6 10*3/uL (ref 0.0–0.7)
Eosinophils Relative: 6.6 % — ABNORMAL HIGH (ref 0.0–5.0)
HCT: 32.7 % — ABNORMAL LOW (ref 36.0–46.0)
Hemoglobin: 11.4 g/dL — ABNORMAL LOW (ref 12.0–15.0)
Lymphocytes Relative: 32.2 % (ref 12.0–46.0)
Lymphs Abs: 2.8 10*3/uL (ref 0.7–4.0)
MCHC: 35 g/dL (ref 30.0–36.0)
MCV: 95.7 fl (ref 78.0–100.0)
Monocytes Absolute: 0.4 10*3/uL (ref 0.1–1.0)
Monocytes Relative: 5.1 % (ref 3.0–12.0)
Neutro Abs: 4.7 10*3/uL (ref 1.4–7.7)
Neutrophils Relative %: 55.1 % (ref 43.0–77.0)
Platelets: 279 10*3/uL (ref 150.0–400.0)
RBC: 3.41 Mil/uL — ABNORMAL LOW (ref 3.87–5.11)
RDW: 12.5 % (ref 11.5–15.5)
WBC: 8.6 10*3/uL (ref 4.0–10.5)

## 2022-05-22 LAB — HEMOGLOBIN A1C: Hgb A1c MFr Bld: 4.7 % (ref 4.6–6.5)

## 2022-05-22 LAB — TSH: TSH: 1.78 u[IU]/mL (ref 0.35–5.50)

## 2022-05-22 LAB — VITAMIN D 25 HYDROXY (VIT D DEFICIENCY, FRACTURES): VITD: 29.02 ng/mL — ABNORMAL LOW (ref 30.00–100.00)

## 2022-05-29 ENCOUNTER — Other Ambulatory Visit: Payer: Self-pay | Admitting: Family

## 2022-05-29 DIAGNOSIS — D649 Anemia, unspecified: Secondary | ICD-10-CM

## 2022-05-30 ENCOUNTER — Encounter: Payer: Self-pay | Admitting: Family

## 2022-06-01 ENCOUNTER — Other Ambulatory Visit: Payer: Self-pay | Admitting: Family

## 2022-06-01 DIAGNOSIS — F32A Depression, unspecified: Secondary | ICD-10-CM

## 2022-06-01 MED ORDER — SERTRALINE HCL 50 MG PO TABS: 100.0000 mg | ORAL_TABLET | Freq: Every day | ORAL | 3 refills | Status: DC

## 2022-06-06 ENCOUNTER — Other Ambulatory Visit (INDEPENDENT_AMBULATORY_CARE_PROVIDER_SITE_OTHER): Payer: No Typology Code available for payment source

## 2022-06-06 DIAGNOSIS — D649 Anemia, unspecified: Secondary | ICD-10-CM

## 2022-06-06 LAB — IBC + FERRITIN
Ferritin: 39.4 ng/mL (ref 10.0–291.0)
Iron: 95 ug/dL (ref 42–145)
Saturation Ratios: 36.5 % (ref 20.0–50.0)
TIBC: 260.4 ug/dL (ref 250.0–450.0)
Transferrin: 186 mg/dL — ABNORMAL LOW (ref 212.0–360.0)

## 2022-06-06 LAB — B12 AND FOLATE PANEL
Folate: 12.9 ng/mL (ref >5.9)
Vitamin B-12: 280 pg/mL (ref 211–911)

## 2022-06-11 ENCOUNTER — Telehealth: Payer: Self-pay

## 2022-06-11 ENCOUNTER — Other Ambulatory Visit: Payer: Self-pay | Admitting: Family

## 2022-06-11 DIAGNOSIS — D649 Anemia, unspecified: Secondary | ICD-10-CM

## 2022-06-11 NOTE — Telephone Encounter (Signed)
LVM to call back to schedule 51mth follow up and nonfasting labs. Patient has already seen her result notes via my chart

## 2022-06-12 ENCOUNTER — Ambulatory Visit (INDEPENDENT_AMBULATORY_CARE_PROVIDER_SITE_OTHER): Payer: No Typology Code available for payment source | Admitting: Neurology

## 2022-06-12 ENCOUNTER — Ambulatory Visit: Payer: No Typology Code available for payment source | Admitting: Neurology

## 2022-06-12 DIAGNOSIS — G43709 Chronic migraine without aura, not intractable, without status migrainosus: Secondary | ICD-10-CM | POA: Diagnosis not present

## 2022-06-12 MED ORDER — ONABOTULINUMTOXINA 200 UNITS IJ SOLR
155.0000 [IU] | Freq: Once | INTRAMUSCULAR | Status: AC
  Administered 2022-06-12: 155 [IU] via INTRAMUSCULAR

## 2022-06-12 NOTE — Progress Notes (Signed)
Consent Form Botulism Toxin Injection For Chronic Migraine  06/12/2022: stable, doing great  03/13/2022: stable doing great, nurtec prn works better than maxalt. > 90% improvement.   12/12/2021:  stable  09/14/2021: stable and doing great  April 06, 2021: Tremendous improvement, patient is extremely thrilled, greater than 90% improvement in migraine frequency and severity.  Today she did talk to me about her sleeping problems, she is very fatigued, snoring, morning headaches, referred her to sleep studies which was negative. Patient has untreated adhd, we can try Adderall and have her back for follow up to discuss  +a. She has a heavy brow so I did the frontalis higher up, .   Reviewed orally with patient, additionally signature is on file:  Botulism toxin has been approved by the Federal drug administration for treatment of chronic migraine. Botulism toxin does not cure chronic migraine and it may not be effective in some patients.  The administration of botulism toxin is accomplished by injecting a small amount of toxin into the muscles of the neck and head. Dosage must be titrated for each individual. Any benefits resulting from botulism toxin tend to wear off after 3 months with a repeat injection required if benefit is to be maintained. Injections are usually done every 3-4 months with maximum effect peak achieved by about 2 or 3 weeks. Botulism toxin is expensive and you should be sure of what costs you will incur resulting from the injection.  The side effects of botulism toxin use for chronic migraine may include:   -Transient, and usually mild, facial weakness with facial injections  -Transient, and usually mild, head or neck weakness with head/neck injections  -Reduction or loss of forehead facial animation due to forehead muscle weakness  -Eyelid drooping  -Dry eye  -Pain at the site of injection or bruising at the site of injection  -Double vision  -Potential unknown long term  risks  Contraindications: You should not have Botox if you are pregnant, nursing, allergic to albumin, have an infection, skin condition, or muscle weakness at the site of the injection, or have myasthenia gravis, Lambert-Eaton syndrome, or ALS.  It is also possible that as with any injection, there may be an allergic reaction or no effect from the medication. Reduced effectiveness after repeated injections is sometimes seen and rarely infection at the injection site may occur. All care will be taken to prevent these side effects. If therapy is given over a long time, atrophy and wasting in the muscle injected may occur. Occasionally the patient's become refractory to treatment because they develop antibodies to the toxin. In this event, therapy needs to be modified.  I have read the above information and consent to the administration of botulism toxin.    BOTOX PROCEDURE NOTE FOR MIGRAINE HEADACHE    Contraindications and precautions discussed with patient(above). Aseptic procedure was observed and patient tolerated procedure. Procedure performed by Dr. Georgia Dom  The condition has existed for more than 6 months, and pt does not have a diagnosis of ALS, Myasthenia Gravis or Lambert-Eaton Syndrome.  Risks and benefits of injections discussed and pt agrees to proceed with the procedure.  Written consent obtained  These injections are medically necessary. Pt  receives good benefits from these injections. These injections do not cause sedations or hallucinations which the oral therapies may cause.  Description of procedure:  The patient was placed in a sitting position. The standard protocol was used for Botox as follows, with 5 units of Botox injected at  each site:   -Procerus muscle, midline injection  -Corrugator muscle, bilateral injection  -Frontalis muscle, bilateral injection, with 2 sites each side, medial injection was performed in the upper one third of the frontalis muscle, in  the region vertical from the medial inferior edge of the superior orbital rim. The lateral injection was again in the upper one third of the forehead vertically above the lateral limbus of the cornea, 1.5 cm lateral to the medial injection site.  -Temporalis muscle injection, 4 sites, bilaterally. The first injection was 3 cm above the tragus of the ear, second injection site was 1.5 cm to 3 cm up from the first injection site in line with the tragus of the ear. The third injection site was 1.5-3 cm forward between the first 2 injection sites. The fourth injection site was 1.5 cm posterior to the second injection site.   -Occipitalis muscle injection, 3 sites, bilaterally. The first injection was done one half way between the occipital protuberance and the tip of the mastoid process behind the ear. The second injection site was done lateral and superior to the first, 1 fingerbreadth from the first injection. The third injection site was 1 fingerbreadth superiorly and medially from the first injection site.  -Cervical paraspinal muscle injection, 2 sites, bilateral knee first injection site was 1 cm from the midline of the cervical spine, 3 cm inferior to the lower border of the occipital protuberance. The second injection site was 1.5 cm superiorly and laterally to the first injection site.  -Trapezius muscle injection was performed at 3 sites, bilaterally. The first injection site was in the upper trapezius muscle halfway between the inflection point of the neck, and the acromion. The second injection site was one half way between the acromion and the first injection site. The third injection was done between the first injection site and the inflection point of the neck.   Will return for repeat injection in 3 months.   155 units of Botox was used, 45 Botox not injected was wasted. The patient tolerated the procedure well, there were no complications of the above procedure.

## 2022-06-12 NOTE — Progress Notes (Signed)
Botox- 200 units x 1 vial Lot: [id number] Expiration: 11/2024 NDC: 5701-7793-90  Bacteriostatic 0.9% Sodium Chloride- 67m total Lot: [id number]: 05/02/2023 NDC: [address]-33 Dx: [id number]/B

## 2022-06-20 ENCOUNTER — Telehealth: Payer: Self-pay

## 2022-06-20 NOTE — Telephone Encounter (Signed)
NOTED

## 2022-06-20 NOTE — Telephone Encounter (Signed)
LVM to call back to schedule f/up appt and nonfasting labs

## 2022-06-20 NOTE — Telephone Encounter (Signed)
Patient returned office phone call. Follow up appt and non-fasting lab made.

## 2022-06-21 ENCOUNTER — Other Ambulatory Visit: Payer: Self-pay | Admitting: Family

## 2022-06-22 ENCOUNTER — Other Ambulatory Visit: Payer: Self-pay

## 2022-06-24 ENCOUNTER — Other Ambulatory Visit: Payer: Self-pay

## 2022-06-24 MED ORDER — NURTEC 75 MG PO TBDP
75.0000 mg | ORAL_TABLET | ORAL | 11 refills | Status: DC | PRN
  Filled 2022-06-24 – 2022-09-14 (×2): qty 16, 30d supply, fill #0

## 2022-06-25 ENCOUNTER — Telehealth: Payer: Self-pay | Admitting: *Deleted

## 2022-06-25 ENCOUNTER — Other Ambulatory Visit: Payer: Self-pay

## 2022-06-25 MED ORDER — NORGESTIMATE-ETH ESTRADIOL 0.25-35 MG-MCG PO TABS
1.0000 | ORAL_TABLET | Freq: Every day | ORAL | 3 refills | Status: DC
  Filled 2022-06-25: qty 84, 84d supply, fill #0
  Filled 2022-09-14: qty 84, 84d supply, fill #1
  Filled 2022-11-22: qty 84, 84d supply, fill #2
  Filled 2023-02-20: qty 84, 84d supply, fill #3

## 2022-06-25 NOTE — Telephone Encounter (Signed)
PA Nurtec submitted on CMM. Key: [id number]. Waiting on determination.

## 2022-06-26 NOTE — Telephone Encounter (Signed)
Nurtec Approved  PA Reference # [id number]  Approval dates 06/25/2022-12/23/2022    Will contact patient through mychart to make aware of approval

## 2022-06-27 ENCOUNTER — Ambulatory Visit
Admission: EM | Admit: 2022-06-27 | Discharge: 2022-06-27 | Disposition: A | Discharge status: Home / Self Care | Payer: No Typology Code available for payment source | Attending: Emergency Medicine | Admitting: Emergency Medicine

## 2022-06-27 ENCOUNTER — Other Ambulatory Visit: Payer: Self-pay

## 2022-06-27 DIAGNOSIS — J020 Streptococcal pharyngitis: Secondary | ICD-10-CM

## 2022-06-27 DIAGNOSIS — R5383 Other fatigue: Secondary | ICD-10-CM

## 2022-06-27 DIAGNOSIS — Z20822 Contact with and (suspected) exposure to covid-19: Secondary | ICD-10-CM | POA: Diagnosis not present

## 2022-06-27 DIAGNOSIS — R519 Headache, unspecified: Secondary | ICD-10-CM | POA: Diagnosis present

## 2022-06-27 LAB — RESP PANEL BY RT-PCR (FLU A&B, COVID) ARPGX2
Influenza A by PCR: NEGATIVE
Influenza B by PCR: NEGATIVE
SARS Coronavirus 2 by RT PCR: NEGATIVE

## 2022-06-27 LAB — POCT RAPID STREP A (OFFICE): Rapid Strep A Screen: POSITIVE — AB

## 2022-06-27 MED ORDER — FLUTICASONE PROPIONATE 50 MCG/ACT NA SUSP
2.0000 | Freq: Every day | NASAL | 0 refills | Status: DC
  Filled 2022-06-27: qty 16, 30d supply, fill #0

## 2022-06-27 MED ORDER — IBUPROFEN 600 MG PO TABS
600.0000 mg | ORAL_TABLET | Freq: Four times a day (QID) | ORAL | 0 refills | Status: DC | PRN
  Filled 2022-06-27: qty 30, 8d supply, fill #0

## 2022-06-27 MED ORDER — PENICILLIN V POTASSIUM 500 MG PO TABS
500.0000 mg | ORAL_TABLET | Freq: Two times a day (BID) | ORAL | 0 refills | Status: AC
  Filled 2022-06-27: qty 20, 10d supply, fill #0

## 2022-06-27 NOTE — Discharge Instructions (Addendum)
Strep was positive today.  Finish the penicillin, even if you feel better.  Mucinex D, saline nasal irrigation with a NeilMed sinus rinse and distilled water as often as you want, Flonase for the nasal congestion.  Stop other cold medications.  1 gram of Tylenol and 600 mg ibuprofen together 3-4 times a day as needed for pain.  Make sure you drink plenty of extra fluids.  Some people find salt water gargles and  Traditional Medicinal's "Throat Coat" tea helpful. Take 5 mL of liquid Benadryl and 5 mL of Maalox. Mix it together, and then hold it in your mouth for as long as you can and then swallow. You may do this 4 times a day.    Go to www.goodrx.com  or www.costplusdrugs.com to look up your medications. This will give you a list of where you can find your prescriptions at the most affordable prices. Or ask the pharmacist what the cash price is, or if they have any other discount programs available to help make your medication more affordable. This can be less expensive than what you would pay with insurance.

## 2022-06-27 NOTE — ED Triage Notes (Addendum)
Patient to Urgent Care with complaints of sore throat, headaches, fatigue, and reports bilateral tonsils feel swollen. Symptoms started several days ago.   Denies any known fevers, did experience some chills on Monday. Has been taking ibuprofen and theraflu/ mucinex melts.

## 2022-06-27 NOTE — ED Provider Notes (Signed)
HPI  SUBJECTIVE:  Patient reports sore throat,, and enlarged tonsils starting 2 days ago. Sx worse with swallowing.  Sx better with ibuprofen, Mucinex and TheraFlu.  No fever + Swollen neck glands   No neck stiffness  + Cough productive of yellowish-green sputum No wheezing + nasal congestion, rhinorrhea + Headache No Rash  No loss of taste or smell No shortness of breath or difficulty breathing No nausea, vomiting No diarrhea No abdominal pain     No Recent Strep, mono, flu, COVID exposure She did not get the COVID or flu vaccinations. No reflux sxs No Allergy sxs  No Breathing difficulty, voice changes, sensation of throat swelling shut No Drooling No Trismus No abx in past month.  + Ibuprofen within 6 hours of evaluation She has a history of migraines and COVID 2 years ago LMP: 2 weeks ago.  Denies the possibility of being pregnant PCP: [facility]   Past Medical History:  Diagnosis Date   GERD (gastroesophageal reflux disease)    Headache    Heart murmur    Hyperlipemia    pt states resolved.   PONV (postoperative nausea and vomiting)     Past Surgical History:  Procedure Laterality Date   breast biopsy Left 08/17/2021   u/s bx, ribbon clip, path pending   BREAST REDUCTION SURGERY Bilateral 11/15/2021   Procedure: BREAST REDUCTION WITH LIPOSUCTION;  Surgeon: Wallace Going, DO;  Location: [facility];  Service: Plastics;  Laterality: Bilateral;  3 hours   CESAREAN SECTION     2014    Family History  Problem Relation Age of Onset   Anemia Mother    Osteoporosis Mother    Heart disease Father    Migraines Sister    Breast cancer Maternal Aunt 53   Cancer Maternal Aunt        breast    Social History   Tobacco Use   Smoking status: Never   Smokeless tobacco: Never  Vaping Use   Vaping Use: Never used  Substance Use Topics   Alcohol use: Yes    Comment: ocassionally   Drug use: No    No current  facility-administered medications for this encounter.  Current Outpatient Medications:    fluticasone (FLONASE) 50 MCG/ACT nasal spray, Place 2 sprays into both nostrils daily., Disp: 16 g, Rfl: 0   ibuprofen (ADVIL) 600 MG tablet, Take 1 tablet (600 mg total) by mouth every 6 (six) hours as needed., Disp: 30 tablet, Rfl: 0   penicillin v potassium (VEETID) 500 MG tablet, Take 1 tablet (500 mg total) by mouth 2 (two) times daily for 10 days., Disp: 20 tablet, Rfl: 0   amphetamine-dextroamphetamine (ADDERALL) 20 MG tablet, Take 1 tablet (20 mg total) by mouth 2 (two) times daily., Disp: 60 tablet, Rfl: 0   Azelaic Acid (FINACEA) 15 % gel, Apply 1 application topically 2 (two) times daily. After skin is thoroughly washed and patted dry, gently but thoroughly massage a thin film of azelaic acid cream into the affected area twice daily, in the morning and evening., Disp: 50 g, Rfl: 11   Galcanezumab-gnlm (EMGALITY) 120 MG/ML SOAJ, Inject 120 mg into the skin every 30 (thirty) days., Disp: , Rfl:    hydrOXYzine (ATARAX) 10 MG tablet, Take 1 tablet (10 mg total) by mouth 2 (two) times daily as needed for anxiety., Disp: 30 tablet, Rfl: 1   Melatonin 10 MG CAPS, Take by mouth., Disp: , Rfl:    norgestimate-ethinyl estradiol (ORTHO-CYCLEN) 0.25-35 MG-MCG  tablet, Take 1 tablet by mouth once daily, Disp: 84 tablet, Rfl: 3   ondansetron (ZOFRAN) 4 MG tablet, Take 1 tablet (4 mg total) by mouth every 8 (eight) hours as needed for nausea or vomiting., Disp: 20 tablet, Rfl: 0   propranolol ER (INDERAL LA) 60 MG 24 hr capsule, TAKE 1 CAPSULE BY MOUTH AT BEDTIME., Disp: 90 capsule, Rfl: 3   Rimegepant Sulfate (NURTEC) 75 MG TBDP, Take 75 mg by mouth as needed., Disp: 16 tablet, Rfl: 11   rizatriptan (MAXALT-MLT) 10 MG disintegrating tablet, TAKE 1 TABLET BY MOUTH AS NEEDED FOR MIGRAINE. MAY REPEAT IN 2 HOURS IF NEEDED, Disp: 9 tablet, Rfl: 11   sertraline (ZOLOFT) 50 MG tablet, Take 2 tablets (100 mg total) by mouth  at bedtime., Disp: 90 tablet, Rfl: 3  No Known Allergies   ROS  As noted in HPI.   Physical Exam  BP 108/70   Pulse 72   Temp 97.7 F (36.5 C)   Resp 18   Ht 4' 11.5" (1.511 m)   Wt 59.9 kg   LMP 06/25/2022   SpO2 98%   BMI 26.21 kg/m   Constitutional: Well developed, well nourished, no acute distress Eyes:  EOMI, conjunctiva normal bilaterally HENT: Normocephalic, atraumatic,mucus membranes moist.  Positive nasal congestion.  Intensely thematous oropharynx as well as normal size without exudates.  Uvula midline.  Positive postnasal drip Respiratory: Normal inspiratory effort Cardiovascular: Normal rate, no trouble murmurs, rubs, gallops GI: nondistended, nontender. No appreciable splenomegaly skin: No rash, skin intact Lymph: Large tender anterior cervical LN.  No posterior cervical lymphadenopathy Musculoskeletal: no deformities Neurologic: Alert & oriented x 3, no focal neuro deficits Psychiatric: Speech and behavior appropriate.   ED Course   Medications - No data to display  Orders Placed This Encounter  Procedures   Resp Panel by RT-PCR (Flu A&B, Covid) Anterior Nasal Swab    Standing Status:   Standing    Number of Occurrences:   1   POCT rapid strep A    Standing Status:   Standing    Number of Occurrences:   1   Results for orders placed or performed during the hospital encounter of 06/27/22  Resp Panel by RT-PCR (Flu A&B, Covid) Anterior Nasal Swab   Specimen: Anterior Nasal Swab  Result Value Ref Range   SARS Coronavirus 2 by RT PCR NEGATIVE NEGATIVE   Influenza A by PCR NEGATIVE NEGATIVE   Influenza B by PCR NEGATIVE NEGATIVE  POCT rapid strep A  Result Value Ref Range   Rapid Strep A Screen Positive (A) Negative     No results found for this or any previous visit (from the past 24 hour(s)).  No results found.  ED Clinical Impression  1. Strep pharyngitis   2. Fatigue, unspecified type   3. Nonintractable headache, unspecified  chronicity pattern, unspecified headache type      ED Assessment/Plan      Rapid strep positive. Sending home with penicillin for 10 days . Home with ibuprofen, Tylenol, Benadryl/Maalox mixture, saline nasal irrigation, Mucinex D.. Patient to followup with PCP when necessary.  Will check COVID/flu.  Patient qualifies for antiviral treatment due to unvaccinated status.  COVID, influenza negative.  Plan as above.   Discussed labs,  MDM, plan and followup with patient. Discussed sn/sx that should prompt return to the ED. patient agrees with plan.   Meds ordered this encounter  Medications   penicillin v potassium (VEETID) 500 MG tablet    Sig:  Take 1 tablet (500 mg total) by mouth 2 (two) times daily for 10 days.    Dispense:  20 tablet    Refill:  0   ibuprofen (ADVIL) 600 MG tablet    Sig: Take 1 tablet (600 mg total) by mouth every 6 (six) hours as needed.    Dispense:  30 tablet    Refill:  0   fluticasone (FLONASE) 50 MCG/ACT nasal spray    Sig: Place 2 sprays into both nostrils daily.    Dispense:  16 g    Refill:  0     *This clinic note was created using [employer]. Therefore, there may be occasional mistakes despite careful proofreading.     Melynda Ripple, MD 06/29/22 [phone]

## 2022-07-09 ENCOUNTER — Encounter: Payer: Self-pay | Admitting: Family

## 2022-07-10 ENCOUNTER — Other Ambulatory Visit: Payer: Self-pay

## 2022-07-10 ENCOUNTER — Other Ambulatory Visit: Payer: Self-pay | Admitting: Family

## 2022-07-10 ENCOUNTER — Telehealth: Payer: Self-pay | Admitting: Family

## 2022-07-10 DIAGNOSIS — N6310 Unspecified lump in the right breast, unspecified quadrant: Secondary | ICD-10-CM

## 2022-07-10 NOTE — Telephone Encounter (Signed)
Lft pt vm to call [name] to sch Diag mammo and [place]. Thanks

## 2022-07-11 ENCOUNTER — Telehealth: Payer: Self-pay | Admitting: Family

## 2022-07-11 NOTE — Telephone Encounter (Signed)
Lft pt vm to call [name] to sch appt. thanks

## 2022-07-13 ENCOUNTER — Telehealth: Payer: Self-pay | Admitting: Family

## 2022-07-13 NOTE — Telephone Encounter (Signed)
lft pt vm to call [name] to sch. thanks

## 2022-07-23 ENCOUNTER — Other Ambulatory Visit (HOSPITAL_COMMUNITY): Payer: Self-pay | Admitting: Diagnostic Radiology

## 2022-07-23 ENCOUNTER — Other Ambulatory Visit: Payer: Self-pay

## 2022-07-23 MED ORDER — AMOXICILLIN-POT CLAVULANATE 500-125 MG PO TABS
1.0000 | ORAL_TABLET | Freq: Two times a day (BID) | ORAL | 0 refills | Status: AC
  Filled 2022-07-23: qty 20, 10d supply, fill #0

## 2022-08-03 ENCOUNTER — Other Ambulatory Visit: Payer: Self-pay | Admitting: Neurology

## 2022-08-03 ENCOUNTER — Other Ambulatory Visit: Payer: Self-pay

## 2022-08-03 DIAGNOSIS — F988 Other specified behavioral and emotional disorders with onset usually occurring in childhood and adolescence: Secondary | ICD-10-CM

## 2022-08-03 DIAGNOSIS — G47419 Narcolepsy without cataplexy: Secondary | ICD-10-CM

## 2022-08-06 ENCOUNTER — Other Ambulatory Visit: Payer: Self-pay

## 2022-08-07 ENCOUNTER — Other Ambulatory Visit: Payer: Self-pay

## 2022-08-07 ENCOUNTER — Ambulatory Visit
Admission: RE | Admit: 2022-08-07 | Discharge: 2022-08-07 | Disposition: A | Discharge status: Home / Self Care | Payer: No Typology Code available for payment source | Source: Ambulatory Visit | Attending: Family | Admitting: Family

## 2022-08-07 DIAGNOSIS — N6310 Unspecified lump in the right breast, unspecified quadrant: Secondary | ICD-10-CM | POA: Insufficient documentation

## 2022-08-07 MED FILL — Amphetamine-Dextroamphetamine Tab 20 MG: ORAL | 30 days supply | Qty: 60 | Fill #0 | Status: AC

## 2022-08-07 NOTE — Telephone Encounter (Signed)
Patient is due for a refill on adderall. Patient is up to date on her appointments. [place] Controlled Substance Registry checked and is appropriate.

## 2022-08-10 ENCOUNTER — Other Ambulatory Visit: Payer: Self-pay

## 2022-08-22 ENCOUNTER — Other Ambulatory Visit: Payer: No Typology Code available for payment source

## 2022-09-04 ENCOUNTER — Ambulatory Visit: Payer: No Typology Code available for payment source | Admitting: Neurology

## 2022-09-13 ENCOUNTER — Other Ambulatory Visit (INDEPENDENT_AMBULATORY_CARE_PROVIDER_SITE_OTHER): Payer: No Typology Code available for payment source

## 2022-09-13 DIAGNOSIS — D649 Anemia, unspecified: Secondary | ICD-10-CM | POA: Diagnosis not present

## 2022-09-13 LAB — CBC WITH DIFFERENTIAL/PLATELET
Basophils Absolute: 0 10*3/uL (ref 0.0–0.1)
Basophils Relative: 0.5 % (ref 0.0–3.0)
Eosinophils Absolute: 0.7 10*3/uL (ref 0.0–0.7)
Eosinophils Relative: 7.3 % — ABNORMAL HIGH (ref 0.0–5.0)
HCT: 35.7 % — ABNORMAL LOW (ref 36.0–46.0)
Hemoglobin: 12.3 g/dL (ref 12.0–15.0)
Lymphocytes Relative: 28 % (ref 12.0–46.0)
Lymphs Abs: 2.8 10*3/uL (ref 0.7–4.0)
MCHC: 34.4 g/dL (ref 30.0–36.0)
MCV: 97 fl (ref 78.0–100.0)
Monocytes Absolute: 0.6 10*3/uL (ref 0.1–1.0)
Monocytes Relative: 5.6 % (ref 3.0–12.0)
Neutro Abs: 5.8 10*3/uL (ref 1.4–7.7)
Neutrophils Relative %: 58.6 % (ref 43.0–77.0)
Platelets: 344 10*3/uL (ref 150.0–400.0)
RBC: 3.68 Mil/uL — ABNORMAL LOW (ref 3.87–5.11)
RDW: 13.4 % (ref 11.5–15.5)
WBC: 9.9 10*3/uL (ref 4.0–10.5)

## 2022-09-14 ENCOUNTER — Other Ambulatory Visit: Payer: Self-pay | Admitting: Neurology

## 2022-09-14 ENCOUNTER — Other Ambulatory Visit: Payer: Self-pay

## 2022-09-14 DIAGNOSIS — G47419 Narcolepsy without cataplexy: Secondary | ICD-10-CM

## 2022-09-14 DIAGNOSIS — F988 Other specified behavioral and emotional disorders with onset usually occurring in childhood and adolescence: Secondary | ICD-10-CM

## 2022-09-16 ENCOUNTER — Other Ambulatory Visit: Payer: Self-pay

## 2022-09-16 ENCOUNTER — Other Ambulatory Visit: Payer: Self-pay | Admitting: Family

## 2022-09-16 DIAGNOSIS — R899 Unspecified abnormal finding in specimens from other organs, systems and tissues: Secondary | ICD-10-CM

## 2022-09-16 DIAGNOSIS — E538 Deficiency of other specified B group vitamins: Secondary | ICD-10-CM

## 2022-09-18 ENCOUNTER — Other Ambulatory Visit: Payer: Self-pay

## 2022-09-18 MED FILL — Amphetamine-Dextroamphetamine Tab 20 MG: ORAL | 30 days supply | Qty: 60 | Fill #0 | Status: AC

## 2022-09-19 ENCOUNTER — Ambulatory Visit: Payer: No Typology Code available for payment source | Admitting: Family

## 2022-09-19 ENCOUNTER — Encounter: Payer: Self-pay | Admitting: Family

## 2022-10-22 ENCOUNTER — Ambulatory Visit: Payer: No Typology Code available for payment source | Admitting: Family

## 2022-10-23 ENCOUNTER — Telehealth: Payer: Self-pay | Admitting: *Deleted

## 2022-10-23 ENCOUNTER — Other Ambulatory Visit: Payer: Self-pay

## 2022-10-23 ENCOUNTER — Other Ambulatory Visit: Payer: Self-pay | Admitting: Neurology

## 2022-10-23 ENCOUNTER — Other Ambulatory Visit (HOSPITAL_COMMUNITY): Payer: Self-pay

## 2022-10-23 DIAGNOSIS — G47419 Narcolepsy without cataplexy: Secondary | ICD-10-CM

## 2022-10-23 DIAGNOSIS — F988 Other specified behavioral and emotional disorders with onset usually occurring in childhood and adolescence: Secondary | ICD-10-CM

## 2022-10-23 MED ORDER — AMPHETAMINE-DEXTROAMPHETAMINE 20 MG PO TABS
20.0000 mg | ORAL_TABLET | Freq: Two times a day (BID) | ORAL | 0 refills | Status: DC
  Filled 2022-10-23: qty 60, 30d supply, fill #0

## 2022-10-23 NOTE — Telephone Encounter (Signed)
Pharmacy Patient Advocate Encounter   Received notification from [facility] that prior authorization for Botox 200UNIT solution is required/requested.   PA submitted on 10/23/2022 to (ins) [employer] via [place] Status is pending

## 2022-10-23 NOTE — Telephone Encounter (Signed)
BOTOX ONE-Benefit Verification [id number] Submitted!

## 2022-10-23 NOTE — Telephone Encounter (Signed)
Chronic Migraine CPT 64615  Botox J0585 Units:200  G43.709 Chronic Migraine without aura, not intractable, without status migrainous  Patient is overdue on her botox and now she has new insurance w/ [facility]. Please process PA asap so we can get her back in. Thank you!

## 2022-10-24 ENCOUNTER — Other Ambulatory Visit (HOSPITAL_COMMUNITY): Payer: Self-pay

## 2022-10-24 ENCOUNTER — Other Ambulatory Visit: Payer: Self-pay

## 2022-11-05 ENCOUNTER — Other Ambulatory Visit: Payer: Self-pay

## 2022-11-05 ENCOUNTER — Other Ambulatory Visit (HOSPITAL_COMMUNITY): Payer: Self-pay

## 2022-11-05 MED ORDER — ONABOTULINUMTOXINA 200 UNITS IJ SOLR
INTRAMUSCULAR | 3 refills | Status: DC
  Filled 2022-11-05: qty 1, 84d supply, fill #0
  Filled 2023-03-27: qty 1, 84d supply, fill #1
  Filled 2023-07-02: qty 1, 84d supply, fill #2
  Filled 2023-10-23: qty 1, 84d supply, fill #3

## 2022-11-05 NOTE — Telephone Encounter (Signed)
Pharmacy Patient Advocate Encounter  Prior Authorization for Botox 200UNIT solution has been approved.    PA# PA Case ID #: [id number] Effective dates: 11/04/2022 through 05/03/2023  Per test billing Copay is $750.00  This can be filled with Brooke Fox

## 2022-11-05 NOTE — Telephone Encounter (Signed)
Scheduled for 2/21 at 4:00 pm. Pt states she signed up for Botox savings program in the past, she states she will check on this and inform [facility] when they call.

## 2022-11-05 NOTE — Telephone Encounter (Signed)
I'm not seeing anything available until mid-March.

## 2022-11-05 NOTE — Addendum Note (Signed)
Addended by: Gildardo Griffes on: 11/05/2022 11:59 AM   Modules accepted: Orders

## 2022-11-14 ENCOUNTER — Other Ambulatory Visit (INDEPENDENT_AMBULATORY_CARE_PROVIDER_SITE_OTHER): Payer: 59

## 2022-11-14 DIAGNOSIS — E538 Deficiency of other specified B group vitamins: Secondary | ICD-10-CM | POA: Diagnosis not present

## 2022-11-14 DIAGNOSIS — R899 Unspecified abnormal finding in specimens from other organs, systems and tissues: Secondary | ICD-10-CM

## 2022-11-15 LAB — CBC WITH DIFFERENTIAL/PLATELET
Basophils Absolute: 0 K/uL (ref 0.0–0.1)
Basophils Relative: 0.5 % (ref 0.0–3.0)
Eosinophils Absolute: 0.5 K/uL (ref 0.0–0.7)
Eosinophils Relative: 4.9 % (ref 0.0–5.0)
HCT: 32.8 % — ABNORMAL LOW (ref 36.0–46.0)
Hemoglobin: 11.4 g/dL — ABNORMAL LOW (ref 12.0–15.0)
Lymphocytes Relative: 23.3 % (ref 12.0–46.0)
Lymphs Abs: 2.3 K/uL (ref 0.7–4.0)
MCHC: 34.9 g/dL (ref 30.0–36.0)
MCV: 98.2 fl (ref 78.0–100.0)
Monocytes Absolute: 0.5 K/uL (ref 0.1–1.0)
Monocytes Relative: 5 % (ref 3.0–12.0)
Neutro Abs: 6.4 K/uL (ref 1.4–7.7)
Neutrophils Relative %: 66.3 % (ref 43.0–77.0)
Platelets: 340 K/uL (ref 150.0–400.0)
RBC: 3.34 Mil/uL — ABNORMAL LOW (ref 3.87–5.11)
RDW: 12.8 % (ref 11.5–15.5)
WBC: 9.6 K/uL (ref 4.0–10.5)

## 2022-11-15 LAB — IBC + FERRITIN
Ferritin: 52.5 ng/mL (ref 10.0–291.0)
Iron: 73 ug/dL (ref 42–145)
Saturation Ratios: 21.3 % (ref 20.0–50.0)
TIBC: 343 ug/dL (ref 250.0–450.0)
Transferrin: 245 mg/dL (ref 212.0–360.0)

## 2022-11-16 LAB — CELIAC DISEASE AB SCREEN W/RFX
Antigliadin Abs, IgA: 8 units (ref 0–19)
IgA/Immunoglobulin A, Serum: 269 mg/dL (ref 87–352)
Transglutaminase IgA: <2 U/mL (ref 0–3)

## 2022-11-17 LAB — INTRINSIC FACTOR ANTIBODIES: Intrinsic Factor: NEGATIVE

## 2022-11-17 LAB — HOMOCYSTEINE: Homocysteine: 5.7 umol/L (ref <10.4)

## 2022-11-17 LAB — METHYLMALONIC ACID, SERUM: Methylmalonic Acid, Quant: 77 nmol/L — ABNORMAL LOW (ref 87–318)

## 2022-11-19 ENCOUNTER — Ambulatory Visit (INDEPENDENT_AMBULATORY_CARE_PROVIDER_SITE_OTHER): Payer: 59 | Admitting: Family

## 2022-11-19 ENCOUNTER — Encounter: Payer: Self-pay | Admitting: Family

## 2022-11-19 VITALS — BP 118/70 | HR 67 | Temp 98.0°F | Ht 59.0 in | Wt 147.4 lb

## 2022-11-19 DIAGNOSIS — F419 Anxiety disorder, unspecified: Secondary | ICD-10-CM

## 2022-11-19 DIAGNOSIS — D649 Anemia, unspecified: Secondary | ICD-10-CM

## 2022-11-19 DIAGNOSIS — F32A Depression, unspecified: Secondary | ICD-10-CM | POA: Diagnosis not present

## 2022-11-19 DIAGNOSIS — E538 Deficiency of other specified B group vitamins: Secondary | ICD-10-CM | POA: Diagnosis not present

## 2022-11-19 NOTE — Progress Notes (Signed)
Assessment & Plan:  B12 deficiency  Anxiety and depression Assessment & Plan: Complete resolved at this time.  Patient is no longer on hydroxyzine, Zoloft.  Will monitor   Anemia, unspecified type Assessment & Plan: Normocytic anemia.  Question of heavy menses played a role.  Ferritin has responded to multivitamin.  Encouraged patient to start ferrous sulfate 325 mg and to trial every other day versus daily due to constipation.  If no improvement of anemia with iron supplementation, advised patient we would need to proceed with  urinalysis, gastroenterology referral.  Patient will follow-up in 2 to 3 months.      Return precautions given.   Risks, benefits, and alternatives of the medications and treatment plan prescribed today were discussed, and patient expressed understanding.   Education regarding symptom management and diagnosis given to patient on AVS either electronically or printed.  Return in about 3 months (around 02/17/2023) for Follow Up Chronic Management.  Mable Paris, FNP  Subjective:    Patient ID: Enedina Finner, female    DOB: 1991-03-05, 32 y.o.   MRN: [id number]  CC: QUENTELLA MCGUFFIE is a 32 y.o. female who presents today for follow up.   HPI: Feels well today.  No new complaints.  Follow-up anemia.    She started ferrous sulfate over-the-counter however caused constipation.  She is currently taking multivitamin which includes iron.  She is taking over-the-counter B12.  No rectal bleeding, hematuria.  She reports menses last month lasted for 2 weeks.  This is unusual for her.  She is compliant with OCP.       UA POC negative RBCs 01/2022 She stopped zoloft and hydroxyzine as she no longer needed for anxiety.  She feels like herself again.   Allergies: Patient has no known allergies. Current Outpatient Medications on File Prior to Visit  Medication Sig Dispense Refill   amphetamine-dextroamphetamine (ADDERALL) 20 MG tablet Take 1 tablet (20 mg total)  by mouth 2 (two) times daily. 60 tablet 0   botulinum toxin Type A (BOTOX) 200 units injection Provider inject 155 units into the muscles of the head and neck every 12 weeks. Discard remainder. 1 each 3   Galcanezumab-gnlm (EMGALITY) 120 MG/ML SOAJ Inject 120 mg into the skin every 30 (thirty) days.     Melatonin 10 MG CAPS Take by mouth.     norgestimate-ethinyl estradiol (ORTHO-CYCLEN) 0.25-35 MG-MCG tablet Take 1 tablet by mouth once daily 84 tablet 3   ondansetron (ZOFRAN) 4 MG tablet Take 1 tablet (4 mg total) by mouth every 8 (eight) hours as needed for nausea or vomiting. 20 tablet 0   propranolol ER (INDERAL LA) 60 MG 24 hr capsule TAKE 1 CAPSULE BY MOUTH AT BEDTIME. 90 capsule 3   Rimegepant Sulfate (NURTEC) 75 MG TBDP Take 75 mg by mouth as needed. 16 tablet 11   rizatriptan (MAXALT-MLT) 10 MG disintegrating tablet TAKE 1 TABLET BY MOUTH AS NEEDED FOR MIGRAINE. MAY REPEAT IN 2 HOURS IF NEEDED 9 tablet 11   No current facility-administered medications on file prior to visit.    Review of Systems  Constitutional:  Negative for chills and fever.  Respiratory:  Negative for cough.   Cardiovascular:  Negative for chest pain and palpitations.  Gastrointestinal:  Negative for nausea and vomiting.      Objective:    BP 118/70   Pulse 67   Temp 98 F (36.7 C) (Oral)   Ht 4' 11"$  (1.499 m)   Wt 147 lb 6.4  oz (66.9 kg)   LMP  (LMP Unknown)   SpO2 99%   BMI 29.77 kg/m  BP Readings from Last 3 Encounters:  11/19/22 118/70  06/27/22 108/70  05/21/22 118/64   Wt Readings from Last 3 Encounters:  11/19/22 147 lb 6.4 oz (66.9 kg)  06/27/22 132 lb (59.9 kg)  05/21/22 138 lb 12.8 oz (63 kg)    Physical Exam Vitals reviewed.  Constitutional:      Appearance: She is well-developed.  Eyes:     Conjunctiva/sclera: Conjunctivae normal.  Cardiovascular:     Rate and Rhythm: Normal rate and regular rhythm.     Pulses: Normal pulses.     Heart sounds: Normal heart sounds.   Pulmonary:     Effort: Pulmonary effort is normal.     Breath sounds: Normal breath sounds. No wheezing, rhonchi or rales.  Skin:    General: Skin is warm and dry.  Neurological:     Mental Status: She is alert.  Psychiatric:        Speech: Speech normal.        Behavior: Behavior normal.        Thought Content: Thought content normal.

## 2022-11-19 NOTE — Assessment & Plan Note (Signed)
Normocytic anemia.  Question of heavy menses played a role.  Ferritin has responded to multivitamin.  Encouraged patient to start ferrous sulfate 325 mg and to trial every other day versus daily due to constipation.  If no improvement of anemia with iron supplementation, advised patient we would need to proceed with  urinalysis, gastroenterology referral.  Patient will follow-up in 2 to 3 months.

## 2022-11-19 NOTE — Patient Instructions (Addendum)
Take ferrous sulfate 320m once every other day. May start colace ( stool softener) daily if needed.   We will recheck iron, anemia in 2-3 months

## 2022-11-19 NOTE — Assessment & Plan Note (Signed)
Complete resolved at this time.  Patient is no longer on hydroxyzine, Zoloft.  Will monitor

## 2022-11-21 ENCOUNTER — Encounter: Payer: Self-pay | Admitting: Family

## 2022-11-21 ENCOUNTER — Other Ambulatory Visit: Payer: Self-pay | Admitting: Family

## 2022-11-21 ENCOUNTER — Ambulatory Visit (INDEPENDENT_AMBULATORY_CARE_PROVIDER_SITE_OTHER): Payer: 59 | Admitting: Neurology

## 2022-11-21 DIAGNOSIS — G43709 Chronic migraine without aura, not intractable, without status migrainosus: Secondary | ICD-10-CM | POA: Diagnosis not present

## 2022-11-21 DIAGNOSIS — D649 Anemia, unspecified: Secondary | ICD-10-CM

## 2022-11-21 MED ORDER — ONABOTULINUMTOXINA 200 UNITS IJ SOLR
155.0000 [IU] | Freq: Once | INTRAMUSCULAR | Status: AC
  Administered 2022-11-21: 155 [IU] via INTRAMUSCULAR

## 2022-11-21 NOTE — Progress Notes (Unsigned)
Botox consent signed  Botox- 200 units x 1 vial Lot: [id number] Expiration: 03/2025 NDC: [id number]  Bacteriostatic 0.9% Sodium Chloride- 68m total Lot: [id number]: 08/2024 NDC: [id number] Dx: [id number]

## 2022-11-22 ENCOUNTER — Other Ambulatory Visit: Payer: Self-pay | Admitting: Neurology

## 2022-11-22 ENCOUNTER — Other Ambulatory Visit: Payer: Self-pay

## 2022-11-22 DIAGNOSIS — F988 Other specified behavioral and emotional disorders with onset usually occurring in childhood and adolescence: Secondary | ICD-10-CM

## 2022-11-22 DIAGNOSIS — G47419 Narcolepsy without cataplexy: Secondary | ICD-10-CM

## 2022-11-22 NOTE — Telephone Encounter (Signed)
Last visit 11/21/22 Next visit due 3 months from now  Rx refill sent to Dr Jaynee Eagles

## 2022-11-22 NOTE — Telephone Encounter (Signed)
LVM to call back to inform pt that she needs to Sch lab/ urine 5 days prior to appt in 2 months I ordered labs

## 2022-11-23 ENCOUNTER — Other Ambulatory Visit: Payer: Self-pay

## 2022-11-23 ENCOUNTER — Other Ambulatory Visit: Payer: Self-pay | Admitting: Family

## 2022-11-23 ENCOUNTER — Encounter: Payer: Self-pay | Admitting: Family

## 2022-11-23 DIAGNOSIS — B379 Candidiasis, unspecified: Secondary | ICD-10-CM

## 2022-11-23 MED ORDER — FLUCONAZOLE 150 MG PO TABS
150.0000 mg | ORAL_TABLET | Freq: Once | ORAL | 0 refills | Status: AC
  Filled 2022-11-23: qty 2, 2d supply, fill #0

## 2022-11-23 NOTE — Telephone Encounter (Signed)
Spoke to pt and scheduled lab appt for 02/13/23

## 2022-11-25 ENCOUNTER — Other Ambulatory Visit: Payer: Self-pay

## 2022-11-26 ENCOUNTER — Other Ambulatory Visit: Payer: Self-pay

## 2022-11-27 ENCOUNTER — Other Ambulatory Visit: Payer: Self-pay

## 2022-11-29 ENCOUNTER — Other Ambulatory Visit: Payer: Self-pay

## 2022-11-29 NOTE — Telephone Encounter (Signed)
Last seen 11/21/22  Next visit pending    Rx refill sent to Dr Jaynee Eagles

## 2022-12-03 MED FILL — Amphetamine-Dextroamphetamine Tab 20 MG: ORAL | 30 days supply | Qty: 60 | Fill #0 | Status: AC

## 2022-12-04 ENCOUNTER — Other Ambulatory Visit: Payer: Self-pay

## 2022-12-07 ENCOUNTER — Other Ambulatory Visit: Payer: Self-pay

## 2022-12-13 DIAGNOSIS — Z1331 Encounter for screening for depression: Secondary | ICD-10-CM | POA: Diagnosis not present

## 2022-12-13 DIAGNOSIS — Z01419 Encounter for gynecological examination (general) (routine) without abnormal findings: Secondary | ICD-10-CM | POA: Diagnosis not present

## 2023-01-13 ENCOUNTER — Other Ambulatory Visit: Payer: Self-pay | Admitting: Neurology

## 2023-01-13 DIAGNOSIS — F988 Other specified behavioral and emotional disorders with onset usually occurring in childhood and adolescence: Secondary | ICD-10-CM

## 2023-01-13 DIAGNOSIS — G47419 Narcolepsy without cataplexy: Secondary | ICD-10-CM

## 2023-01-15 ENCOUNTER — Other Ambulatory Visit: Payer: Self-pay

## 2023-01-15 ENCOUNTER — Other Ambulatory Visit: Payer: Self-pay | Admitting: Neurology

## 2023-01-15 DIAGNOSIS — G47419 Narcolepsy without cataplexy: Secondary | ICD-10-CM

## 2023-01-15 DIAGNOSIS — F988 Other specified behavioral and emotional disorders with onset usually occurring in childhood and adolescence: Secondary | ICD-10-CM

## 2023-01-17 ENCOUNTER — Other Ambulatory Visit: Payer: Self-pay

## 2023-01-17 MED FILL — Amphetamine-Dextroamphetamine Tab 20 MG: ORAL | 30 days supply | Qty: 60 | Fill #0 | Status: AC

## 2023-01-18 ENCOUNTER — Other Ambulatory Visit: Payer: Self-pay

## 2023-01-25 ENCOUNTER — Other Ambulatory Visit (HOSPITAL_COMMUNITY): Payer: Self-pay

## 2023-01-25 ENCOUNTER — Encounter: Payer: Self-pay | Admitting: *Deleted

## 2023-01-28 ENCOUNTER — Other Ambulatory Visit (HOSPITAL_COMMUNITY): Payer: Self-pay

## 2023-01-30 ENCOUNTER — Other Ambulatory Visit (HOSPITAL_COMMUNITY): Payer: Self-pay

## 2023-02-01 ENCOUNTER — Other Ambulatory Visit (HOSPITAL_COMMUNITY): Payer: Self-pay

## 2023-02-08 ENCOUNTER — Telehealth: Payer: 59 | Admitting: Emergency Medicine

## 2023-02-08 ENCOUNTER — Other Ambulatory Visit: Payer: Self-pay

## 2023-02-08 DIAGNOSIS — J329 Chronic sinusitis, unspecified: Secondary | ICD-10-CM

## 2023-02-08 MED ORDER — AMOXICILLIN-POT CLAVULANATE 875-125 MG PO TABS
1.0000 | ORAL_TABLET | Freq: Two times a day (BID) | ORAL | 0 refills | Status: DC
  Filled 2023-02-08: qty 14, 7d supply, fill #0

## 2023-02-08 NOTE — Progress Notes (Signed)

## 2023-02-11 ENCOUNTER — Other Ambulatory Visit (HOSPITAL_COMMUNITY): Payer: Self-pay

## 2023-02-13 ENCOUNTER — Other Ambulatory Visit: Payer: 59

## 2023-02-15 ENCOUNTER — Other Ambulatory Visit (HOSPITAL_COMMUNITY): Payer: Self-pay

## 2023-02-18 ENCOUNTER — Ambulatory Visit: Payer: 59 | Admitting: Family

## 2023-02-20 ENCOUNTER — Other Ambulatory Visit: Payer: Self-pay

## 2023-02-20 ENCOUNTER — Telehealth: Payer: Self-pay

## 2023-02-20 ENCOUNTER — Other Ambulatory Visit (HOSPITAL_COMMUNITY): Payer: Self-pay

## 2023-02-20 NOTE — Telephone Encounter (Signed)
Patient Advocate Encounter   Received notification from [employer] that prior authorization is required for Nurtec 75MG  dispersible tablets   Submitted: 02-20-2023 Key [id number]  Status is pending

## 2023-02-24 ENCOUNTER — Other Ambulatory Visit (HOSPITAL_COMMUNITY): Payer: Self-pay

## 2023-02-24 NOTE — Telephone Encounter (Signed)
Pharmacy Patient Advocate Encounter  Prior Authorization for Nurtec 75MG  dispersible tablets has been approved by [employer] (ins).    PA # PA Case ID #: [id number]  Effective dates: 02/22/2023 through 02/21/2024

## 2023-03-07 ENCOUNTER — Other Ambulatory Visit: Payer: Self-pay | Admitting: Neurology

## 2023-03-07 ENCOUNTER — Other Ambulatory Visit: Payer: Self-pay

## 2023-03-07 DIAGNOSIS — G47419 Narcolepsy without cataplexy: Secondary | ICD-10-CM

## 2023-03-07 DIAGNOSIS — F988 Other specified behavioral and emotional disorders with onset usually occurring in childhood and adolescence: Secondary | ICD-10-CM

## 2023-03-07 MED FILL — Amphetamine-Dextroamphetamine Tab 20 MG: ORAL | 30 days supply | Qty: 60 | Fill #0 | Status: AC

## 2023-03-07 MED FILL — Propranolol HCl Cap ER 24HR 60 MG: ORAL | 90 days supply | Qty: 90 | Fill #0 | Status: AC

## 2023-03-07 NOTE — Telephone Encounter (Signed)
Requested Prescriptions   Pending Prescriptions Disp Refills   amphetamine-dextroamphetamine (ADDERALL) 20 MG tablet 60 tablet 0    Sig: Take 1 tablet (20 mg total) by mouth 2 (two) times daily.   propranolol ER (INDERAL LA) 60 MG 24 hr capsule 90 capsule 3    Sig: TAKE 1 CAPSULE BY MOUTH AT BEDTIME.   Last seen 11/21/22, next appt not scheduled yet  Dispenses   Dispensed Days Supply Quantity Provider Pharmacy  amphetamine-dextroamphetamine (ADDERALL) 20 MG tablet 01/18/2023 30 60 tablet Anson Fret, MD [facility]...  amphetamine-dextroamphetamine (ADDERALL) 20 MG tablet 12/06/2022 30 60 tablet Anson Fret, MD [facility] - Co...  amphetamine-dextroamphetamine (ADDERALL) 20 MG tablet 10/30/2022 30 60 tablet Anson Fret, MD [facility] - Co...  amphetamine-dextroamphetamine (ADDERALL) 20 MG tablet 09/20/2022 30 60 tablet Anson Fret, MD [facility] - Co...  amphetamine-dextroamphetamine (ADDERALL) 20 MG tablet 08/08/2022 30 60 tablet Anson Fret, MD [facility] - Co...  amphetamine-dextroamphetamine (ADDERALL) 20 MG tablet 05/21/2022 30 60 tablet Anson Fret, MD [facility] - Co...  amphetamine-dextroamphetamine (ADDERALL) 20 MG tablet 04/06/2022 30 60 tablet Sater, Pearletha Furl, MD [facility] - Co..Marland Kitchen

## 2023-03-08 ENCOUNTER — Other Ambulatory Visit: Payer: Self-pay

## 2023-03-27 ENCOUNTER — Other Ambulatory Visit (HOSPITAL_COMMUNITY): Payer: Self-pay

## 2023-03-28 ENCOUNTER — Other Ambulatory Visit: Payer: Self-pay

## 2023-03-28 ENCOUNTER — Ambulatory Visit: Payer: 59 | Admitting: Neurology

## 2023-03-28 DIAGNOSIS — G43709 Chronic migraine without aura, not intractable, without status migrainosus: Secondary | ICD-10-CM | POA: Diagnosis not present

## 2023-03-28 MED ORDER — ONABOTULINUMTOXINA 200 UNITS IJ SOLR
155.0000 [IU] | Freq: Once | INTRAMUSCULAR | Status: AC
Start: 2023-03-28 — End: 2023-03-28
  Administered 2023-03-28: 155 [IU] via INTRAMUSCULAR

## 2023-03-28 NOTE — Progress Notes (Signed)
Botox- 200 units x 1 vial Lot: [id number] Expiration: 05/2025 NDC: 0347-4259-56  Bacteriostatic 0.9% Sodium Chloride- 4 mL  Lot: [id number] Expiration: 12/31/2023 NDC: 3329-5188-41  Dx: Y60.630 S/P  Witnessed by Scharlene Gloss CMA

## 2023-04-01 NOTE — Progress Notes (Signed)
Consent Form Botulism Toxin Injection For Chronic Migraine   03/28/2023: Stable, >> 50% migraine relief in freq and severity 06/12/2022: stable, doing great  03/13/2022: stable doing great, nurtec prn works better than maxalt. > 90% improvement.   12/12/2021:  stable  09/14/2021: stable and doing great  April 06, 2021: Tremendous improvement, patient is extremely thrilled, greater than 90% improvement in migraine frequency and severity.  Today she did talk to me about her sleeping problems, she is very fatigued, snoring, morning headaches, referred her to sleep studies which was negative. Patient has untreated adhd, we can try Adderall and have her back for follow up to discuss  +a. She has a heavy brow so I did the frontalis higher up, .   Reviewed orally with patient, additionally signature is on file:  Botulism toxin has been approved by the Federal drug administration for treatment of chronic migraine. Botulism toxin does not cure chronic migraine and it may not be effective in some patients.  The administration of botulism toxin is accomplished by injecting a small amount of toxin into the muscles of the neck and head. Dosage must be titrated for each individual. Any benefits resulting from botulism toxin tend to wear off after 3 months with a repeat injection required if benefit is to be maintained. Injections are usually done every 3-4 months with maximum effect peak achieved by about 2 or 3 weeks. Botulism toxin is expensive and you should be sure of what costs you will incur resulting from the injection.  The side effects of botulism toxin use for chronic migraine may include:   -Transient, and usually mild, facial weakness with facial injections  -Transient, and usually mild, head or neck weakness with head/neck injections  -Reduction or loss of forehead facial animation due to forehead muscle weakness  -Eyelid drooping  -Dry eye  -Pain at the site of injection or bruising at the  site of injection  -Double vision  -Potential unknown long term risks  Contraindications: You should not have Botox if you are pregnant, nursing, allergic to albumin, have an infection, skin condition, or muscle weakness at the site of the injection, or have myasthenia gravis, Lambert-Eaton syndrome, or ALS.  It is also possible that as with any injection, there may be an allergic reaction or no effect from the medication. Reduced effectiveness after repeated injections is sometimes seen and rarely infection at the injection site may occur. All care will be taken to prevent these side effects. If therapy is given over a long time, atrophy and wasting in the muscle injected may occur. Occasionally the patient's become refractory to treatment because they develop antibodies to the toxin. In this event, therapy needs to be modified.  I have read the above information and consent to the administration of botulism toxin.    BOTOX PROCEDURE NOTE FOR MIGRAINE HEADACHE    Contraindications and precautions discussed with patient(above). Aseptic procedure was observed and patient tolerated procedure. Procedure performed by Dr. Artemio Aly  The condition has existed for more than 6 months, and pt does not have a diagnosis of ALS, Myasthenia Gravis or Lambert-Eaton Syndrome.  Risks and benefits of injections discussed and pt agrees to proceed with the procedure.  Written consent obtained  These injections are medically necessary. Pt  receives good benefits from these injections. These injections do not cause sedations or hallucinations which the oral therapies may cause.  Description of procedure:  The patient was placed in a sitting position. The standard protocol was used  for Botox as follows, with 5 units of Botox injected at each site:   -Procerus muscle, midline injection  -Corrugator muscle, bilateral injection  -Frontalis muscle, bilateral injection, with 2 sites each side, medial injection  was performed in the upper one third of the frontalis muscle, in the region vertical from the medial inferior edge of the superior orbital rim. The lateral injection was again in the upper one third of the forehead vertically above the lateral limbus of the cornea, 1.5 cm lateral to the medial injection site.  -Temporalis muscle injection, 4 sites, bilaterally. The first injection was 3 cm above the tragus of the ear, second injection site was 1.5 cm to 3 cm up from the first injection site in line with the tragus of the ear. The third injection site was 1.5-3 cm forward between the first 2 injection sites. The fourth injection site was 1.5 cm posterior to the second injection site.   -Occipitalis muscle injection, 3 sites, bilaterally. The first injection was done one half way between the occipital protuberance and the tip of the mastoid process behind the ear. The second injection site was done lateral and superior to the first, 1 fingerbreadth from the first injection. The third injection site was 1 fingerbreadth superiorly and medially from the first injection site.  -Cervical paraspinal muscle injection, 2 sites, bilateral knee first injection site was 1 cm from the midline of the cervical spine, 3 cm inferior to the lower border of the occipital protuberance. The second injection site was 1.5 cm superiorly and laterally to the first injection site.  -Trapezius muscle injection was performed at 3 sites, bilaterally. The first injection site was in the upper trapezius muscle halfway between the inflection point of the neck, and the acromion. The second injection site was one half way between the acromion and the first injection site. The third injection was done between the first injection site and the inflection point of the neck.   Will return for repeat injection in 3 months.   155 units of Botox was used, 45 Botox not injected was wasted. The patient tolerated the procedure well, there were no  complications of the above procedure.

## 2023-04-16 ENCOUNTER — Other Ambulatory Visit: Payer: Self-pay | Admitting: Neurology

## 2023-04-16 ENCOUNTER — Other Ambulatory Visit: Payer: Self-pay

## 2023-04-16 DIAGNOSIS — G47419 Narcolepsy without cataplexy: Secondary | ICD-10-CM

## 2023-04-16 DIAGNOSIS — F988 Other specified behavioral and emotional disorders with onset usually occurring in childhood and adolescence: Secondary | ICD-10-CM

## 2023-04-18 ENCOUNTER — Other Ambulatory Visit: Payer: Self-pay

## 2023-04-18 NOTE — Telephone Encounter (Signed)
Last seen 03/28/23. See info below from [place] registry regarding last fill date.

## 2023-04-19 ENCOUNTER — Other Ambulatory Visit: Payer: Self-pay

## 2023-04-20 MED FILL — Amphetamine-Dextroamphetamine Tab 20 MG: ORAL | 30 days supply | Qty: 60 | Fill #0 | Status: AC

## 2023-04-22 ENCOUNTER — Other Ambulatory Visit: Payer: Self-pay

## 2023-05-20 ENCOUNTER — Other Ambulatory Visit: Payer: Self-pay

## 2023-05-20 MED ORDER — NORGESTIMATE-ETH ESTRADIOL 0.25-35 MG-MCG PO TABS
1.0000 | ORAL_TABLET | Freq: Every day | ORAL | 3 refills | Status: DC
  Filled 2023-05-20 (×2): qty 84, 84d supply, fill #0
  Filled 2023-08-07: qty 84, 84d supply, fill #1
  Filled 2023-11-03: qty 84, 84d supply, fill #2
  Filled 2024-01-23: qty 84, 84d supply, fill #3

## 2023-05-30 ENCOUNTER — Other Ambulatory Visit: Payer: Self-pay | Admitting: Neurology

## 2023-05-30 DIAGNOSIS — G47419 Narcolepsy without cataplexy: Secondary | ICD-10-CM

## 2023-05-30 DIAGNOSIS — F988 Other specified behavioral and emotional disorders with onset usually occurring in childhood and adolescence: Secondary | ICD-10-CM

## 2023-05-30 NOTE — Telephone Encounter (Signed)
Dispensed Days Supply Quantity Provider Pharmacy  amphetamine-dextroamphetamine (ADDERALL) 20 MG tablet 04/29/2023 30 60 tablet Anson Fret, MD [facility] - [facility]...  amphetamine-dextroamphetamine (ADDERALL) 20 MG tablet 03/18/2023 30 60 tablet Anson Fret, MD [facility] - Co...  amphetamine-dextroamphetamine (ADDERALL) 20 MG tablet 01/18/2023 30 60 tablet Anson Fret, MD [facility] - Co...  amphetamine-dextroamphetamine (ADDERALL) 20 MG tablet 12/06/2022 30 60 tablet Anson Fret, MD [facility] - Co...  amphetamine-dextroamphetamine (ADDERALL) 20 MG tablet 10/30/2022 30 60 tablet Anson Fret, MD [facility] - Co...  amphetamine-dextroamphetamine (ADDERALL) 20 MG tablet 09/20/2022 30 60 tablet Anson Fret, MD [facility] - Co...  amphetamine-dextroamphetamine (ADDERALL) 20 MG tablet 08/08/2022 30 60 tablet Anson Fret, MD [facility] - Co...    Last visit 03/28/2023 Next visit not scheduled

## 2023-05-31 ENCOUNTER — Other Ambulatory Visit: Payer: Self-pay

## 2023-05-31 MED ORDER — AMPHETAMINE-DEXTROAMPHETAMINE 20 MG PO TABS
20.0000 mg | ORAL_TABLET | Freq: Two times a day (BID) | ORAL | 0 refills | Status: DC
Start: 2023-05-31 — End: 2023-07-09
  Filled 2023-05-31: qty 60, 30d supply, fill #0

## 2023-06-10 ENCOUNTER — Other Ambulatory Visit (HOSPITAL_COMMUNITY): Payer: Self-pay

## 2023-06-12 ENCOUNTER — Other Ambulatory Visit (HOSPITAL_COMMUNITY): Payer: Self-pay

## 2023-06-14 ENCOUNTER — Other Ambulatory Visit (HOSPITAL_COMMUNITY): Payer: Self-pay

## 2023-06-20 ENCOUNTER — Other Ambulatory Visit: Payer: Self-pay

## 2023-06-24 ENCOUNTER — Other Ambulatory Visit: Payer: Self-pay

## 2023-06-24 ENCOUNTER — Telehealth: Payer: 59 | Admitting: Family Medicine

## 2023-06-24 DIAGNOSIS — J329 Chronic sinusitis, unspecified: Secondary | ICD-10-CM

## 2023-06-24 MED ORDER — AMOXICILLIN-POT CLAVULANATE 875-125 MG PO TABS
1.0000 | ORAL_TABLET | Freq: Two times a day (BID) | ORAL | 0 refills | Status: AC
Start: 2023-06-24 — End: 2023-07-01
  Filled 2023-06-24: qty 14, 7d supply, fill #0

## 2023-06-24 NOTE — Progress Notes (Signed)

## 2023-06-25 ENCOUNTER — Ambulatory Visit: Payer: 59 | Admitting: Neurology

## 2023-06-26 NOTE — Telephone Encounter (Signed)
Patient wants to schedule appointment for Botox but I see her auth expired 05/03/23. Can we get it renewed asap? Her last botox was 03/28/23.

## 2023-06-27 NOTE — Telephone Encounter (Signed)
Submitted auth renewal via CMM, status is pending. Key: [id number]

## 2023-07-01 NOTE — Telephone Encounter (Signed)
Received approval from [facility]:  Auth# [id number] (06/27/23-06/25/24).

## 2023-07-01 NOTE — Telephone Encounter (Signed)
Yes! She can see an NP. Do any NPs have an opening soon?

## 2023-07-02 ENCOUNTER — Other Ambulatory Visit: Payer: Self-pay

## 2023-07-02 NOTE — Telephone Encounter (Signed)
Checking schedules and contacting patient. Will update asap.

## 2023-07-02 NOTE — Progress Notes (Signed)
Specialty Pharmacy Refill Coordination Note  Brooke Fox is a 32 y.o. female contacted today regarding refills of specialty medication(s) Onabotulinumtoxina .  Patient requested Courier to Provider Office  on 07/03/23  to verified address [facility], [address], [place], [address]   Medication will be filled on 07/02/2023.

## 2023-07-02 NOTE — Telephone Encounter (Signed)
Pt is scheduled for this Thursday with Shanda Bumps NP.

## 2023-07-04 ENCOUNTER — Other Ambulatory Visit: Payer: Self-pay

## 2023-07-04 ENCOUNTER — Ambulatory Visit: Payer: 59 | Admitting: Adult Health

## 2023-07-04 DIAGNOSIS — G43709 Chronic migraine without aura, not intractable, without status migrainosus: Secondary | ICD-10-CM

## 2023-07-04 MED ORDER — NURTEC 75 MG PO TBDP
75.0000 mg | ORAL_TABLET | ORAL | 11 refills | Status: DC | PRN
  Filled 2023-07-04: qty 16, 30d supply, fill #0

## 2023-07-04 MED ORDER — RIZATRIPTAN BENZOATE 10 MG PO TBDP
ORAL_TABLET | ORAL | 11 refills | Status: DC
  Filled 2023-07-04: qty 9, 30d supply, fill #0

## 2023-07-04 MED ORDER — ONABOTULINUMTOXINA 200 UNITS IJ SOLR
155.0000 [IU] | Freq: Once | INTRAMUSCULAR | Status: AC
Start: 2023-07-04 — End: 2023-07-04
  Administered 2023-07-04: 155 [IU] via INTRAMUSCULAR

## 2023-07-04 NOTE — Progress Notes (Signed)
Botox- 200 units x 1 vial Lot: [id number] Expiration: 08/2025 NDC: 5409-8119-14  Bacteriostatic 0.9% Sodium Chloride- 4mL total Lot: [id number] Expiration: 12/31/2023 NDC: 2130-8657-84  Dx: O96.295 SP  Witnessed by: Clemencia Course

## 2023-07-04 NOTE — Progress Notes (Signed)
Returns for repeat Botox.  Prior Botox 03/28/2023 with Dr. Lucia Gaskins.  Continued great benefit with use of Botox, can wear off in the last month having about 3 migraine days but otherwise well controlled. Use of Nurtec as needed with benefit, does have rx for rizatriptan as needed but has not recently used.  Tolerated procedure well today. will return in 3 months for repeat injection.       Consent Form Botulism Toxin Injection For Chronic Migraine    Reviewed orally with patient, additionally signature is on file:  Botulism toxin has been approved by the Federal drug administration for treatment of chronic migraine. Botulism toxin does not cure chronic migraine and it may not be effective in some patients.  The administration of botulism toxin is accomplished by injecting a small amount of toxin into the muscles of the neck and head. Dosage must be titrated for each individual. Any benefits resulting from botulism toxin tend to wear off after 3 months with a repeat injection required if benefit is to be maintained. Injections are usually done every 3-4 months with maximum effect peak achieved by about 2 or 3 weeks. Botulism toxin is expensive and you should be sure of what costs you will incur resulting from the injection.  The side effects of botulism toxin use for chronic migraine may include:   -Transient, and usually mild, facial weakness with facial injections  -Transient, and usually mild, head or neck weakness with head/neck injections  -Reduction or loss of forehead facial animation due to forehead muscle weakness  -Eyelid drooping  -Dry eye  -Pain at the site of injection or bruising at the site of injection  -Double vision  -Potential unknown long term risks   Contraindications: You should not have Botox if you are pregnant, nursing, allergic to albumin, have an infection, skin condition, or muscle weakness at the site of the injection, or have myasthenia gravis,  Lambert-Eaton syndrome, or ALS.  It is also possible that as with any injection, there may be an allergic reaction or no effect from the medication. Reduced effectiveness after repeated injections is sometimes seen and rarely infection at the injection site may occur. All care will be taken to prevent these side effects. If therapy is given over a long time, atrophy and wasting in the muscle injected may occur. Occasionally the patient's become refractory to treatment because they develop antibodies to the toxin. In this event, therapy needs to be modified.  I have read the above information and consent to the administration of botulism toxin.    BOTOX PROCEDURE NOTE FOR MIGRAINE HEADACHE  Contraindications and precautions discussed with patient(above). Aseptic procedure was observed and patient tolerated procedure. Procedure performed by Ihor Austin, AGNP-BC.   The condition has existed for more than 6 months, and pt does not have a diagnosis of ALS, Myasthenia Gravis or Lambert-Eaton Syndrome.  Risks and benefits of injections discussed and pt agrees to proceed with the procedure.  Written consent obtained  These injections are medically necessary. Pt  receives good benefits from these injections. These injections do not cause sedations or hallucinations which the oral therapies may cause.   Description of procedure:  The patient was placed in a sitting position. The standard protocol was used for Botox as follows, with 5 units of Botox injected at each site:  -Procerus muscle, midline injection  -Corrugator muscle, bilateral injection  -Frontalis muscle, bilateral injection, with 2 sites each side, medial injection was performed in the upper  one third of the frontalis muscle, in the region vertical from the medial inferior edge of the superior orbital rim. The lateral injection was again in the upper one third of the forehead vertically above the lateral limbus of the cornea, 1.5 cm  lateral to the medial injection site.  -Temporalis muscle injection, 4 sites, bilaterally. The first injection was 3 cm above the tragus of the ear, second injection site was 1.5 cm to 3 cm up from the first injection site in line with the tragus of the ear. The third injection site was 1.5-3 cm forward between the first 2 injection sites. The fourth injection site was 1.5 cm posterior to the second injection site. 5th site laterally in the temporalis  muscleat the level of the outer canthus.  -Occipitalis muscle injection, 3 sites, bilaterally. The first injection was done one half way between the occipital protuberance and the tip of the mastoid process behind the ear. The second injection site was done lateral and superior to the first, 1 fingerbreadth from the first injection. The third injection site was 1 fingerbreadth superiorly and medially from the first injection site.  -Cervical paraspinal muscle injection, 2 sites, bilaterally. The first injection site was 1 cm from the midline of the cervical spine, 3 cm inferior to the lower border of the occipital protuberance. The second injection site was 1.5 cm superiorly and laterally to the first injection site.  -Trapezius muscle injection was performed at 3 sites, bilaterally. The first injection site was in the upper trapezius muscle halfway between the inflection point of the neck, and the acromion. The second injection site was one half way between the acromion and the first injection site. The third injection was done between the first injection site and the inflection point of the neck.    A total of 200 units of Botox was prepared, 155 units of Botox was injected as documented above, any Botox not injected was wasted. The patient tolerated the procedure well, there were no complications of the above procedure.   Ihor Austin, AGNP-BC  [facility] [address] [place], [place] [address]  Phone  [phone] Fax [phone] Note: This document was prepared with digital dictation and possible smart phrase technology. Any transcriptional errors that result from this process are unintentional.

## 2023-07-09 ENCOUNTER — Other Ambulatory Visit: Payer: Self-pay

## 2023-07-09 ENCOUNTER — Other Ambulatory Visit: Payer: Self-pay | Admitting: Neurology

## 2023-07-09 DIAGNOSIS — F988 Other specified behavioral and emotional disorders with onset usually occurring in childhood and adolescence: Secondary | ICD-10-CM

## 2023-07-09 DIAGNOSIS — G47419 Narcolepsy without cataplexy: Secondary | ICD-10-CM

## 2023-07-09 MED ORDER — AMPHETAMINE-DEXTROAMPHETAMINE 20 MG PO TABS
20.0000 mg | ORAL_TABLET | Freq: Two times a day (BID) | ORAL | 0 refills | Status: DC
Start: 2023-07-09 — End: 2023-08-20
  Filled 2023-07-09: qty 60, 30d supply, fill #0

## 2023-07-09 NOTE — Telephone Encounter (Signed)
Last seen 07/04/23 Next appt due in 3 months.  Per adherence report on epic, last filled:   Rx refill sent to Dr Lucia Gaskins

## 2023-07-16 ENCOUNTER — Other Ambulatory Visit: Payer: Self-pay

## 2023-08-07 ENCOUNTER — Other Ambulatory Visit: Payer: Self-pay

## 2023-08-20 ENCOUNTER — Other Ambulatory Visit: Payer: Self-pay | Admitting: Neurology

## 2023-08-20 ENCOUNTER — Other Ambulatory Visit: Payer: Self-pay

## 2023-08-20 DIAGNOSIS — G47419 Narcolepsy without cataplexy: Secondary | ICD-10-CM

## 2023-08-20 DIAGNOSIS — F988 Other specified behavioral and emotional disorders with onset usually occurring in childhood and adolescence: Secondary | ICD-10-CM

## 2023-08-21 ENCOUNTER — Encounter: Payer: Self-pay | Admitting: *Deleted

## 2023-08-22 NOTE — Telephone Encounter (Signed)
Patient only seen by me for botox, did not discuss medications, previously filled by Dr. Lucia Gaskins, also followed by Dr. Vickey Huger. Please send request to either Dr. Lucia Gaskins or Dr. Vickey Huger. Thank you.

## 2023-08-25 MED ORDER — AMPHETAMINE-DEXTROAMPHETAMINE 20 MG PO TABS
20.0000 mg | ORAL_TABLET | Freq: Two times a day (BID) | ORAL | 0 refills | Status: DC
  Filled 2023-08-25: qty 60, 30d supply, fill #0

## 2023-08-26 ENCOUNTER — Other Ambulatory Visit: Payer: Self-pay

## 2023-09-11 ENCOUNTER — Encounter: Payer: Self-pay | Admitting: Obstetrics and Gynecology

## 2023-09-11 ENCOUNTER — Ambulatory Visit
Admission: RE | Admit: 2023-09-11 | Discharge: 2023-09-11 | Disposition: A | Discharge status: Home / Self Care | Payer: 59 | Source: Ambulatory Visit | Attending: Obstetrics and Gynecology | Admitting: Obstetrics and Gynecology

## 2023-09-11 ENCOUNTER — Other Ambulatory Visit: Payer: Self-pay | Admitting: Obstetrics and Gynecology

## 2023-09-11 DIAGNOSIS — Z1231 Encounter for screening mammogram for malignant neoplasm of breast: Secondary | ICD-10-CM

## 2023-09-12 ENCOUNTER — Other Ambulatory Visit: Payer: Self-pay

## 2023-09-27 IMAGING — MG US BREAST BX W LOC DEV 1ST LESION IMG BX SPEC US GUIDE*L*
1 series · 8 of 8 positions shown · non-contrast
Comparison: Previous exam(s).
COMPARISON: Previous exam(s).

Addendum:
CLINICAL DATA: Patient presents for ultrasound-guided core biopsy
of LEFT breast mass.

EXAM:
ULTRASOUND GUIDED LEFT BREAST CORE NEEDLE BIOPSY

[Series 1: MG view · 0.08mm/px · 8 of 14 slices shown]
[im 1/14]
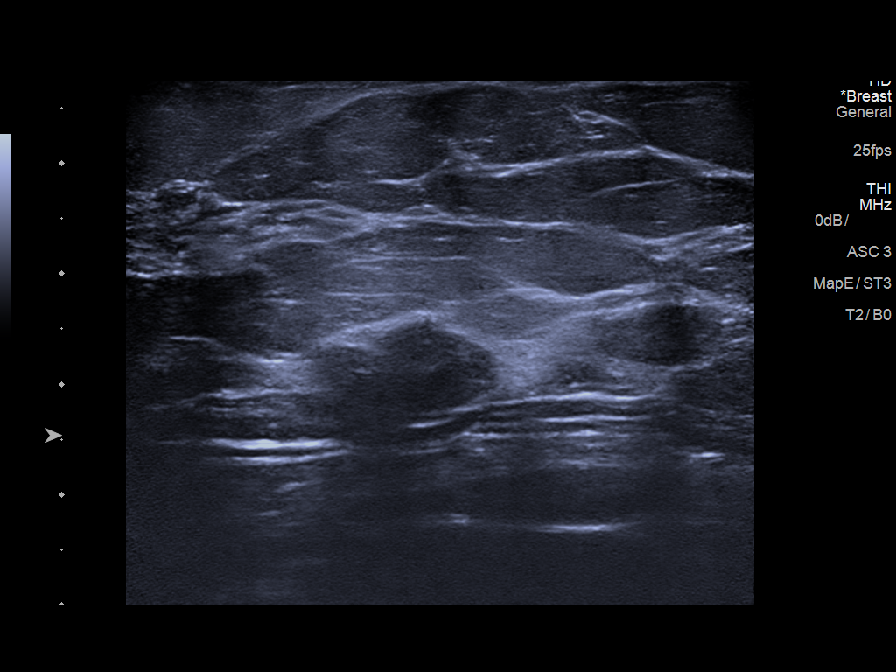
[im 2/14]
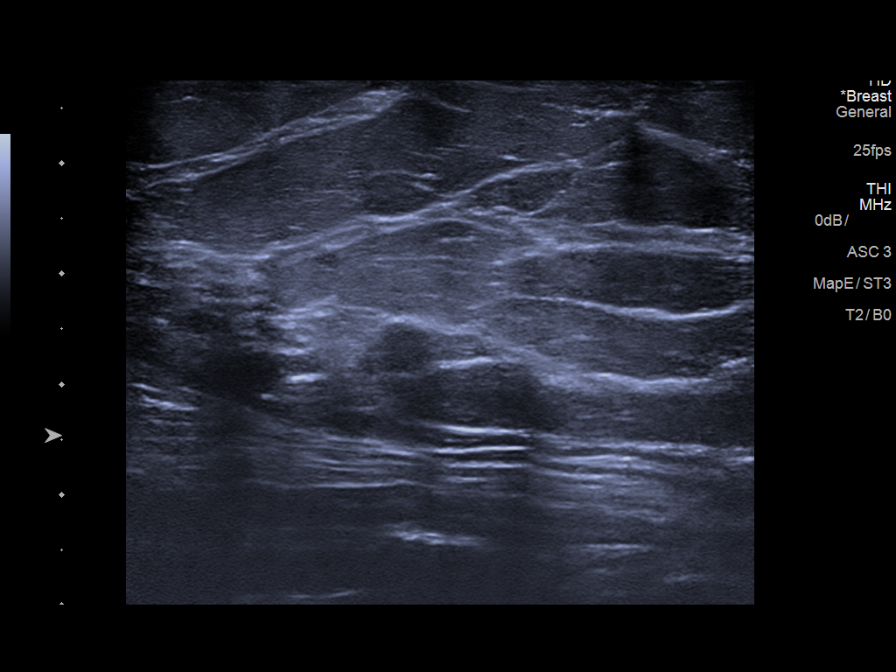
[im 4/14]
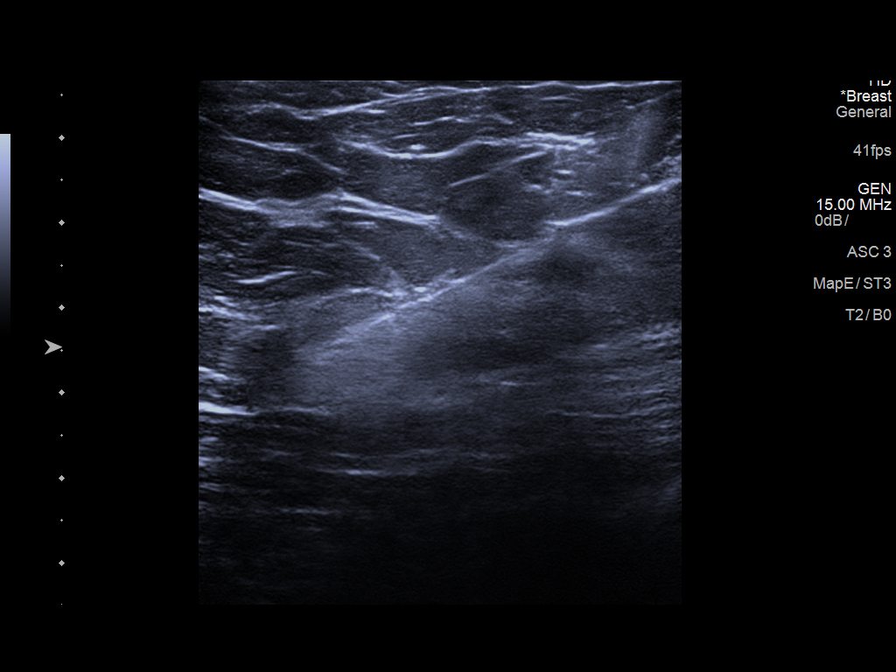
[im 6/14]
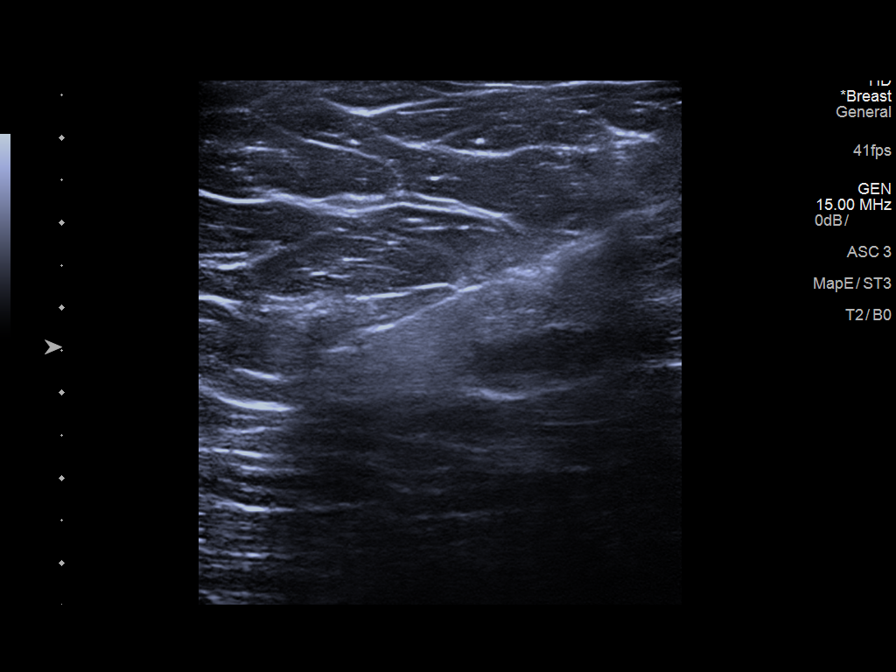
[im 8/14]
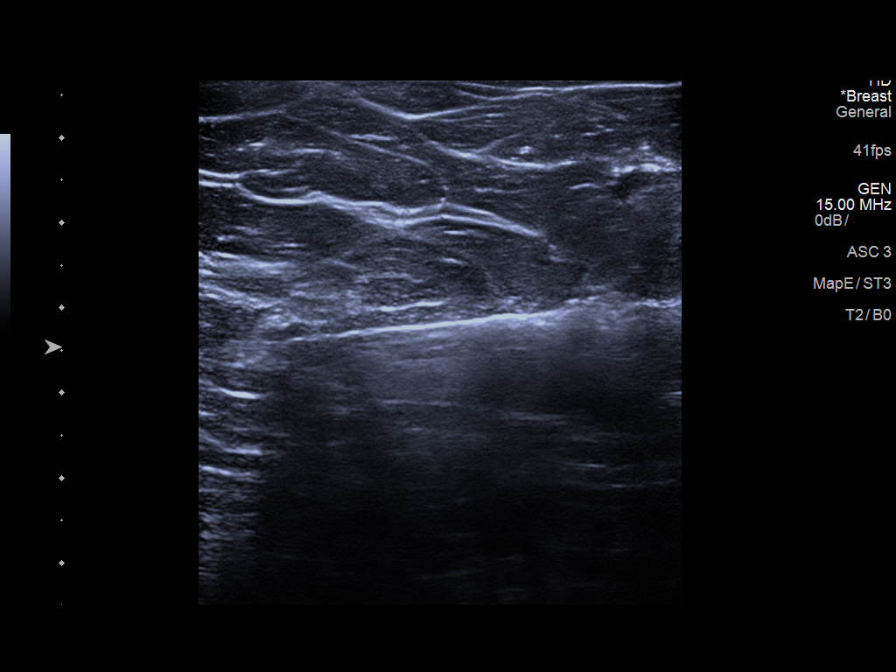
[im 10/14]
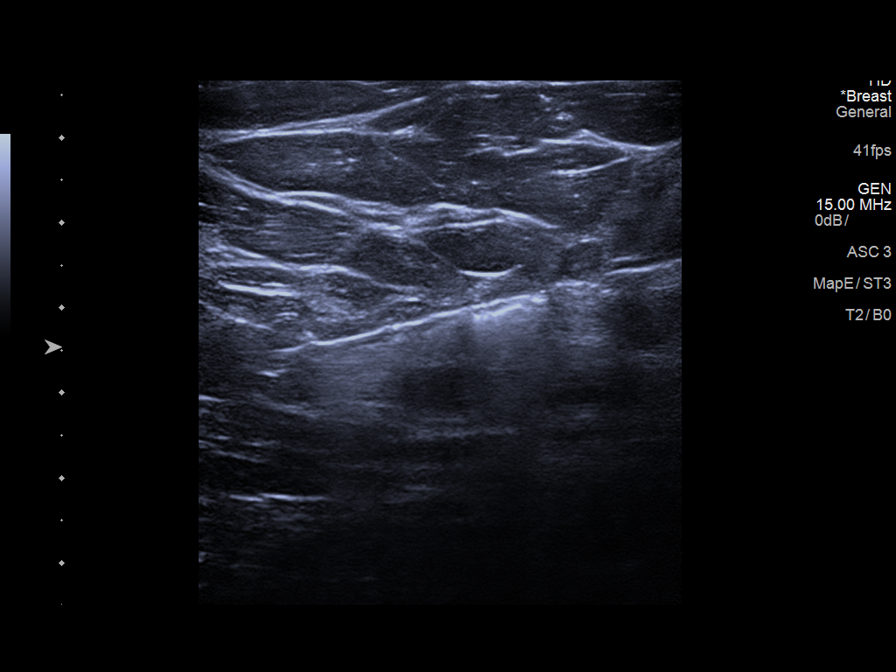
[im 12/14]
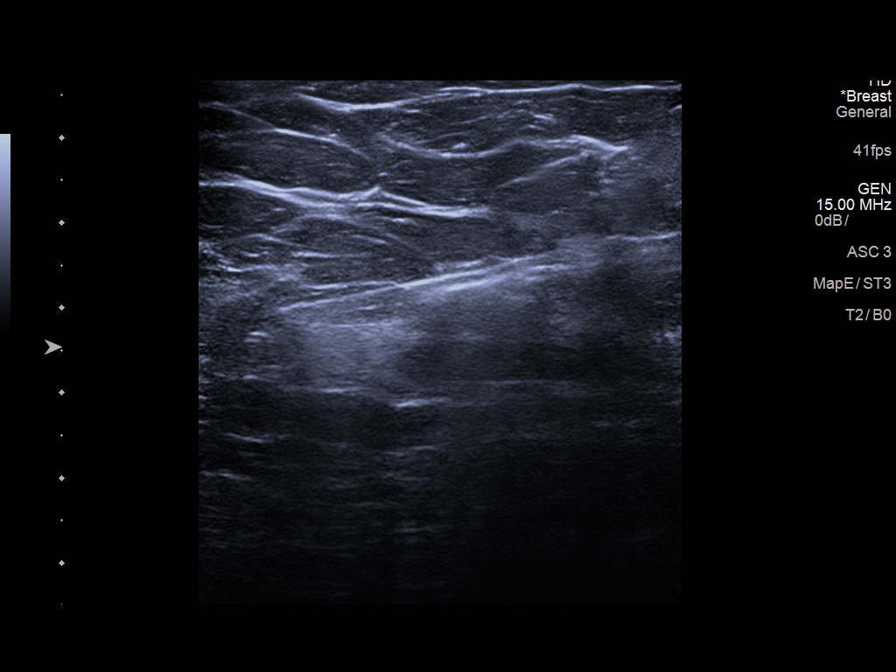
[im 14/14]
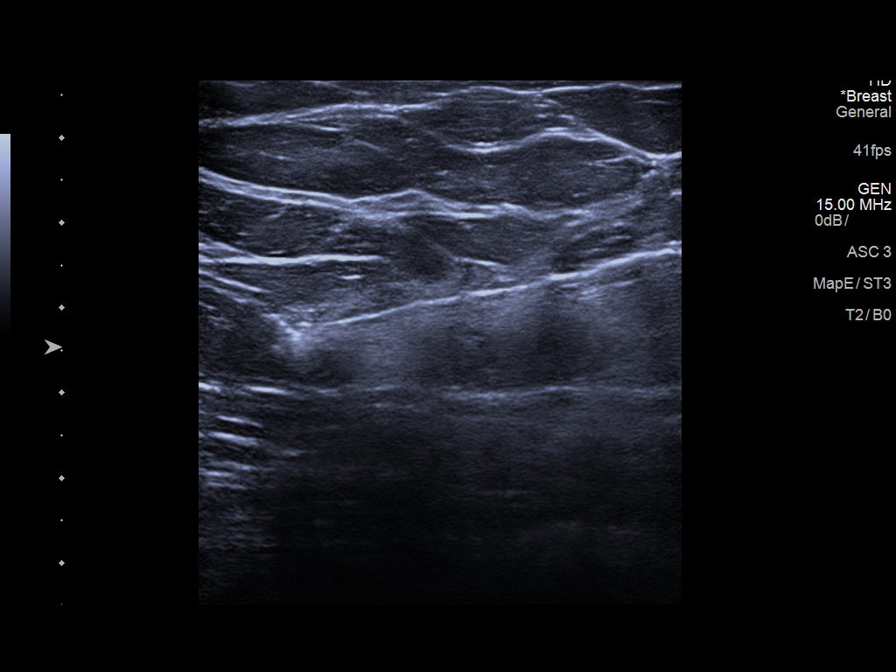

[8 of 8 positions shown; findings below may reference images not displayed]



Lesion quadrant: UPPER-OUTER QUADRANT breast

Using sterile technique and 1% Lidocaine as local anesthetic, under
direct ultrasound visualization, a 12 gauge Konrad device was
used to perform biopsy of mass in the 2 o'clock location of the LEFT
using a LATERAL to MEDIAL approach. At the conclusion of the
procedure ribbon shaped tissue marker clip was deployed into the
biopsy cavity. Follow up 2 view mammogram was performed and dictated
separately.
IMPRESSION: Ultrasound guided biopsy of LEFT breast mass. No apparent
complications.

ADDENDUM:
PATHOLOGY revealed: A. BREAST, LEFT [DATE] 6 CM FN; ULTRASOUND-GUIDED
BIOPSY: - BENIGN BREAST TISSUE WITH HYALINIZED FIBROADENOMA AND
PSEUDO ANGIOMATOUS STROMAL HYPERPLASIA. - NEGATIVE FOR ATYPIA AND
MALIGNANCY.

Pathology results are CONCORDANT with imaging findings, per Dr.
Alia Tiger.

Pathology results and recommendations were discussed with patient by
biopsy site doing well with no adverse symptoms. Post biopsy care
instructions were reviewed, questions were answered and my direct
phone number was provided. Patient was instructed to call [HOSPITAL]
[REDACTED] for any additional questions or concerns related to
biopsy site.

RECOMMENDATION: Patient instructed to continue with monthly self
breast examinations, clinical follow-up for subsequent concerns, and
begin bilateral screening mammogram at age 38.

Pathology results reported by Reishiro Samuka RN on 08/18/2021.



Lesion quadrant: UPPER-OUTER QUADRANT breast

Using sterile technique and 1% Lidocaine as local anesthetic, under
direct ultrasound visualization, a 12 gauge Konrad device was
used to perform biopsy of mass in the 2 o'clock location of the LEFT
using a LATERAL to MEDIAL approach. At the conclusion of the
procedure ribbon shaped tissue marker clip was deployed into the
biopsy cavity. Follow up 2 view mammogram was performed and dictated
separately.
IMPRESSION: Ultrasound guided biopsy of LEFT breast mass. No apparent
complications.

## 2023-09-27 IMAGING — MG MM BREAST LOCALIZATION CLIP
4 series · 4 of 12 positions shown · non-contrast
Comparison: Previous exam(s).

CLINICAL DATA: Status post ultrasound-guided core biopsy of LEFT
breast mass.

EXAM:
3D DIAGNOSTIC LEFT MAMMOGRAM POST ULTRASOUND BIOPSY

[L ML synth-2D]
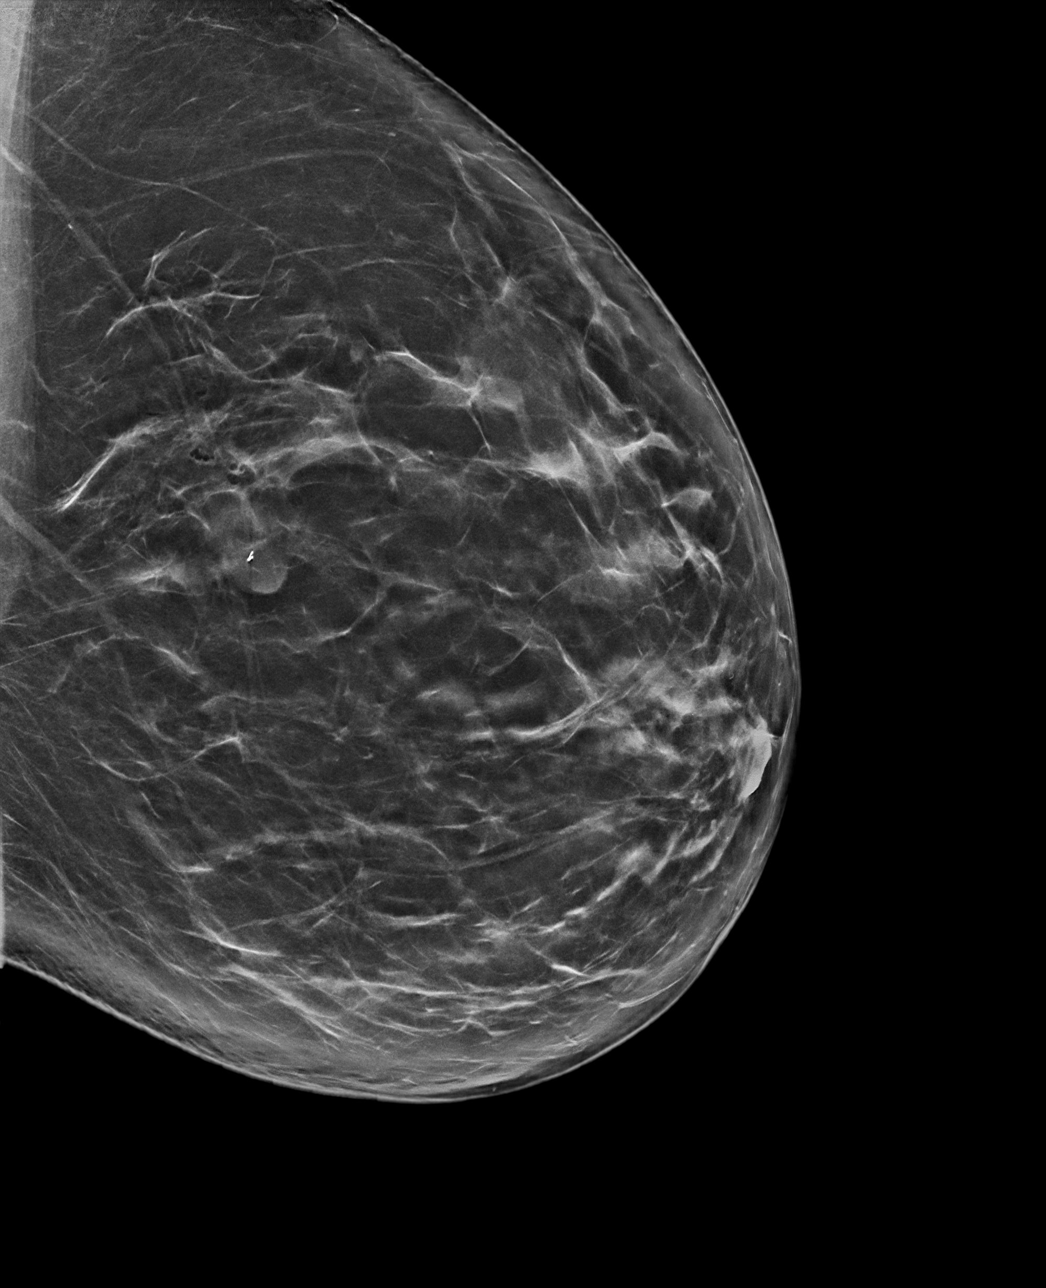

[L CC synth-2D]
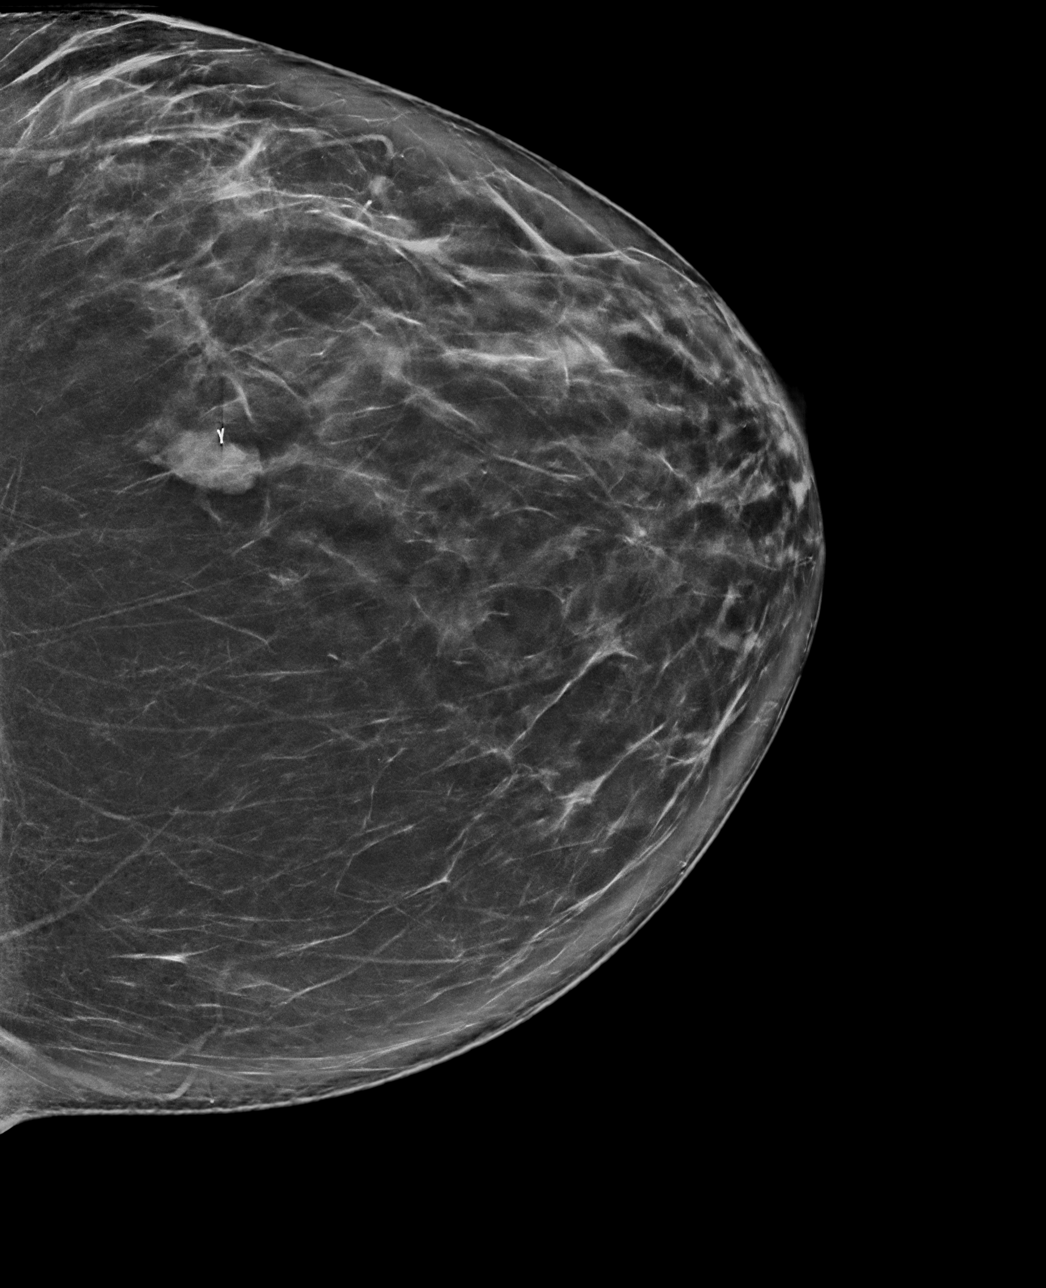

[L ML tomo · tomo slice 49/97.0]
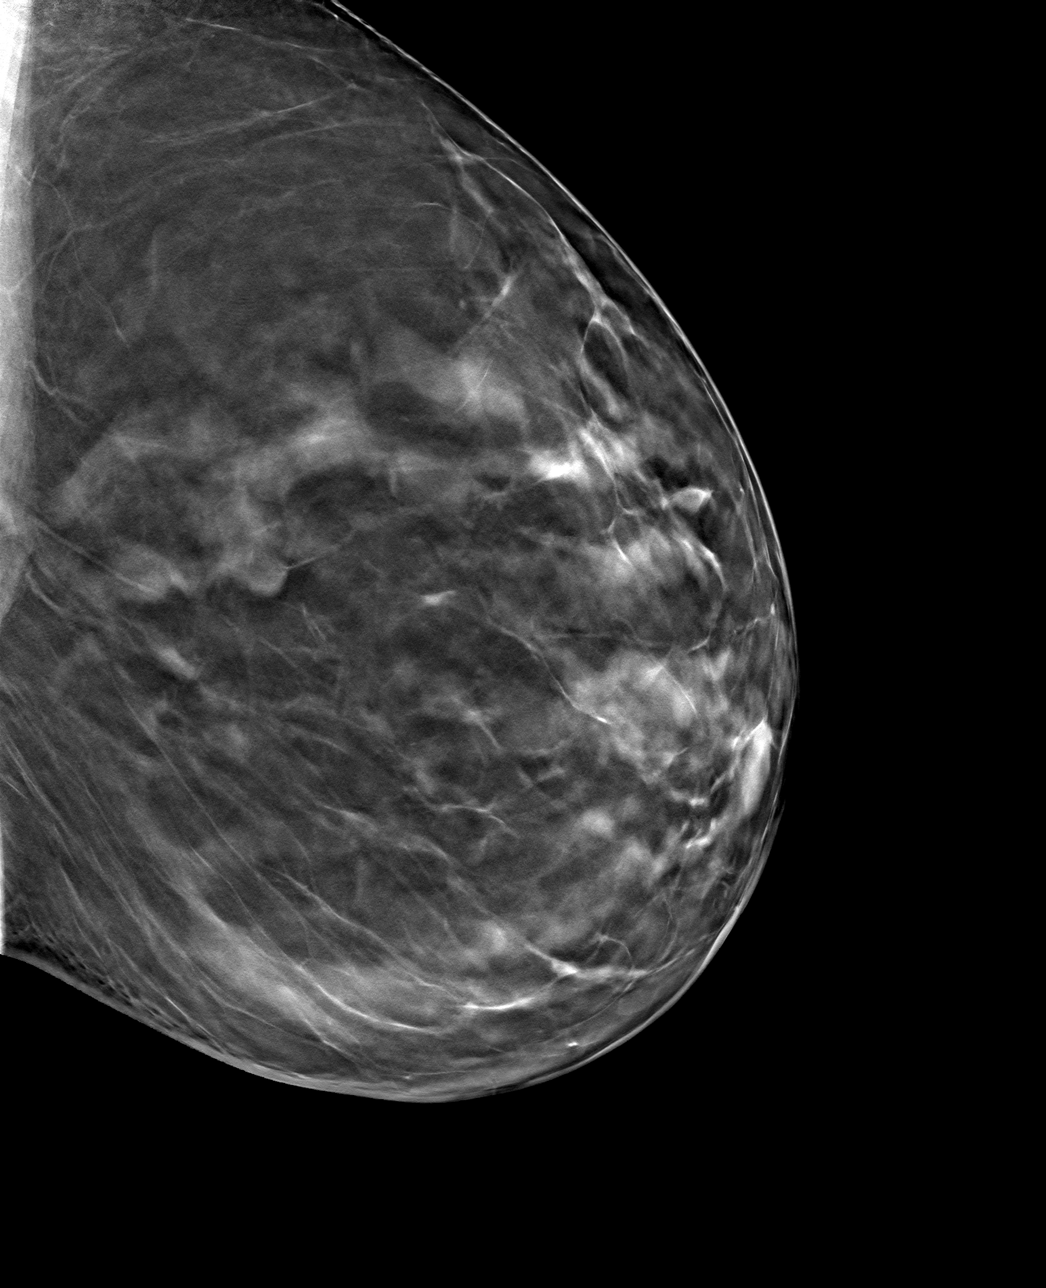

[L CC tomo · tomo slice 47/93.0]
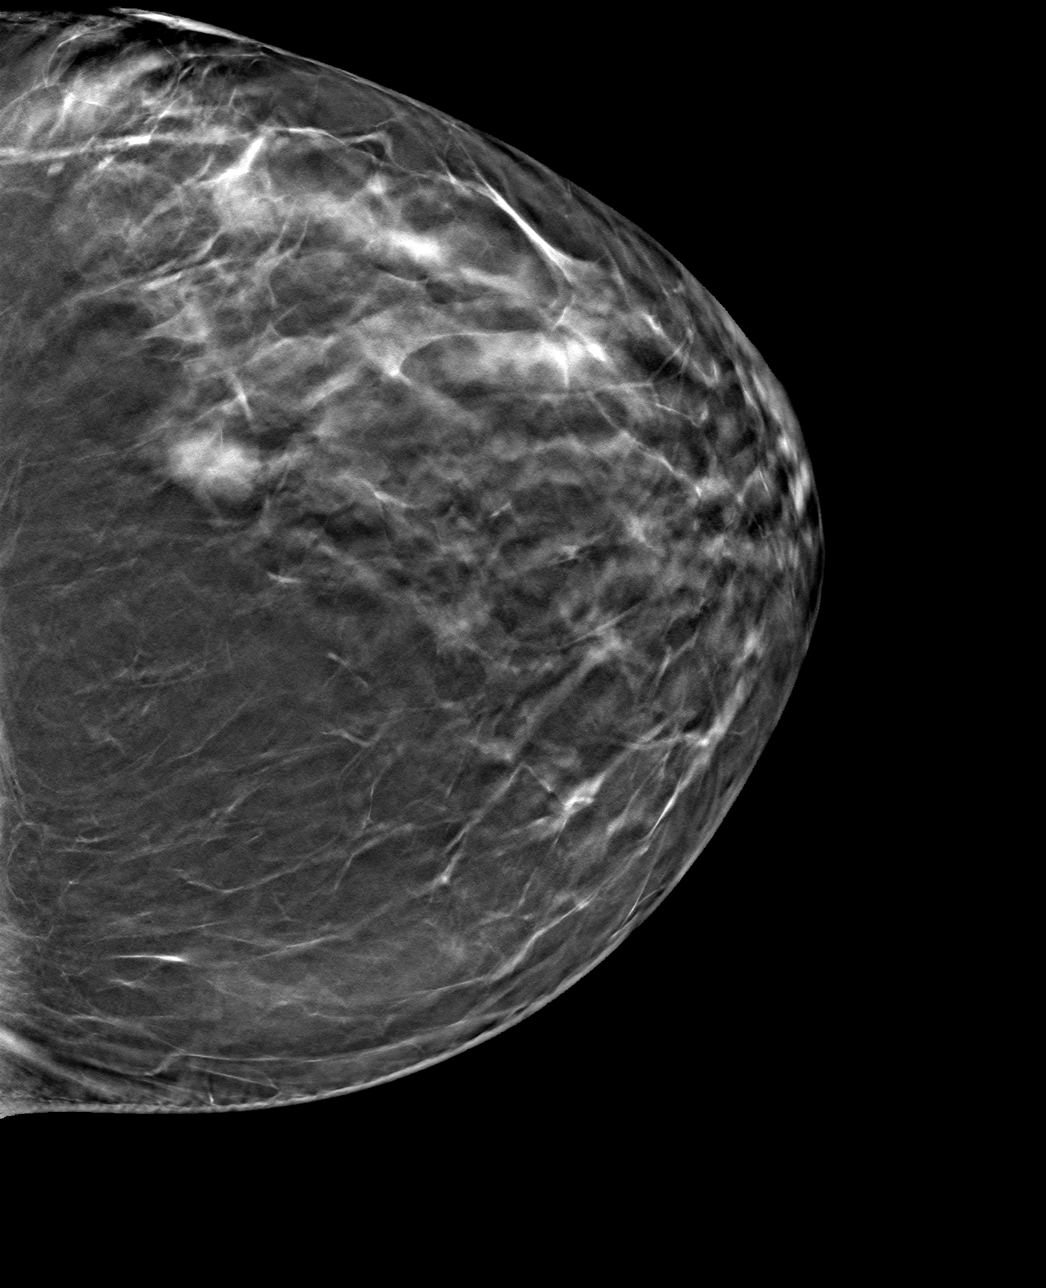

[4 of 12 positions shown; findings below may reference images not displayed]

FINDINGS: 3D Mammographic images were obtained following ultrasound guided
biopsy of mass in the 2 o'clock location of the LEFT breast and
placement of a ribbon shaped clip. The biopsy marking clip is in
expected position at the site of biopsy.
IMPRESSION: Appropriate positioning of the ribbon shaped biopsy marking clip at
the site of biopsy in the mass in the QUADRANT of the LEFT breast.

Final Assessment: Post Procedure Mammograms for Marker Placement

## 2023-10-04 ENCOUNTER — Other Ambulatory Visit: Payer: Self-pay

## 2023-10-04 ENCOUNTER — Telehealth: Payer: Commercial Managed Care - PPO | Admitting: Family Medicine

## 2023-10-04 ENCOUNTER — Other Ambulatory Visit: Payer: Self-pay | Admitting: Neurology

## 2023-10-04 DIAGNOSIS — J028 Acute pharyngitis due to other specified organisms: Secondary | ICD-10-CM | POA: Diagnosis not present

## 2023-10-04 DIAGNOSIS — F988 Other specified behavioral and emotional disorders with onset usually occurring in childhood and adolescence: Secondary | ICD-10-CM

## 2023-10-04 DIAGNOSIS — B9689 Other specified bacterial agents as the cause of diseases classified elsewhere: Secondary | ICD-10-CM | POA: Diagnosis not present

## 2023-10-04 DIAGNOSIS — G47419 Narcolepsy without cataplexy: Secondary | ICD-10-CM

## 2023-10-04 MED ORDER — LIDOCAINE VISCOUS HCL 2 % MT SOLN
15.0000 mL | OROMUCOSAL | 0 refills | Status: AC | PRN
  Filled 2023-10-04: qty 100, 2d supply, fill #0

## 2023-10-04 MED ORDER — AMOXICILLIN 500 MG PO CAPS
500.0000 mg | ORAL_CAPSULE | Freq: Two times a day (BID) | ORAL | 0 refills | Status: AC
  Filled 2023-10-04: qty 20, 10d supply, fill #0

## 2023-10-04 NOTE — Progress Notes (Signed)
E-Visit for Sore Throat - Strep Symptoms  We are sorry that you are not feeling well.  Here is how we plan to help!  Based on what you have shared with me it is likely that you have strep pharyngitis.  Strep pharyngitis is inflammation and infection in the back of the throat.  This is an infection cause by bacteria and is treated with antibiotics.  I have prescribed Amoxicillin 500 mg twice a day for 10 days and 2% Viscous Lidocaine 5 ml gargle and swallow every 4 hours as needed for throat pain. For throat pain, we recommend over the counter oral pain relief medications such as acetaminophen or aspirin, or anti-inflammatory medications such as ibuprofen or naproxen sodium. Topical treatments such as oral throat lozenges or sprays may be used as needed. Strep infections are not as easily transmitted as other respiratory infections, however we still recommend that you avoid close contact with loved ones, especially the very young and elderly.  Remember to wash your hands thoroughly throughout the day as this is the number one way to prevent the spread of infection and wipe down door knobs and counters with disinfectant.   Home Care: Only take medications as instructed by your medical team. Complete the entire course of an antibiotic. Do not take these medications with alcohol. A steam or ultrasonic humidifier can help congestion.  You can place a towel over your head and breathe in the steam from hot water coming from a faucet. Avoid close contacts especially the very young and the elderly. Cover your mouth when you cough or sneeze. Always remember to wash your hands.  Get Help Right Away If: You develop worsening fever or sinus pain. You develop a severe head ache or visual changes. Your symptoms persist after you have completed your treatment plan.  Make sure you Understand these instructions. Will watch your condition. Will get help right away if you are not doing well or get  worse.   Thank you for choosing an e-visit.  Your e-visit answers were reviewed by a board certified advanced clinical practitioner to complete your personal care plan. Depending upon the condition, your plan could have included both over the counter or prescription medications.  Please review your pharmacy choice. Make sure the pharmacy is open so you can pick up prescription now. If there is a problem, you may contact your provider through [employer] and have the prescription routed to another pharmacy.  Your safety is important to [place]. If you have drug allergies check your prescription carefully.   For the next 24 hours you can use MyChart to ask questions about today's visit, request a non-urgent call back, or ask for a work or school excuse. You will get an email in the next two days asking about your experience. I hope that your e-visit has been valuable and will speed your recovery.  I provided 5 minutes of non face-to-face time during this encounter for chart review, medication and order placement, as well as and documentation.

## 2023-10-06 ENCOUNTER — Other Ambulatory Visit: Payer: Self-pay | Admitting: Neurology

## 2023-10-06 ENCOUNTER — Other Ambulatory Visit: Payer: Self-pay

## 2023-10-06 DIAGNOSIS — F988 Other specified behavioral and emotional disorders with onset usually occurring in childhood and adolescence: Secondary | ICD-10-CM

## 2023-10-06 DIAGNOSIS — G47419 Narcolepsy without cataplexy: Secondary | ICD-10-CM

## 2023-10-08 ENCOUNTER — Other Ambulatory Visit: Payer: Self-pay

## 2023-10-09 ENCOUNTER — Telehealth: Payer: Self-pay | Admitting: Neurology

## 2023-10-09 NOTE — Telephone Encounter (Signed)
 Submitted auth under [employer] via CMM, status is pending. Pt's plan changed [facility]. Key: Brooke Fox

## 2023-10-10 ENCOUNTER — Other Ambulatory Visit: Payer: Self-pay

## 2023-10-10 ENCOUNTER — Ambulatory Visit: Payer: 59 | Admitting: Adult Health

## 2023-10-10 MED FILL — Amphetamine-Dextroamphetamine Tab 20 MG: ORAL | 30 days supply | Qty: 60 | Fill #0 | Status: AC

## 2023-10-11 ENCOUNTER — Other Ambulatory Visit: Payer: Self-pay

## 2023-10-15 ENCOUNTER — Other Ambulatory Visit: Payer: Self-pay

## 2023-10-17 ENCOUNTER — Other Ambulatory Visit: Payer: Self-pay

## 2023-10-23 ENCOUNTER — Other Ambulatory Visit: Payer: Self-pay

## 2023-10-23 NOTE — Progress Notes (Signed)
Specialty Pharmacy Refill Coordination Note  Brooke Fox is a 33 y.o. female assessed today regarding refills of clinic administered specialty medication(s) OnabotulinumtoxinA (BOTOX) Spoke with Alphonsa Gin   Clinic requested Courier to Provider Office   Delivery date: 10/24/23   Verified address: [facility], [address], [place], [address]   Medication will be filled on 01.22.25.

## 2023-10-24 ENCOUNTER — Other Ambulatory Visit: Payer: Self-pay

## 2023-10-28 ENCOUNTER — Ambulatory Visit: Payer: Commercial Managed Care - PPO | Admitting: Neurology

## 2023-10-28 DIAGNOSIS — G43709 Chronic migraine without aura, not intractable, without status migrainosus: Secondary | ICD-10-CM

## 2023-10-28 MED ORDER — ONABOTULINUMTOXINA 200 UNITS IJ SOLR
155.0000 [IU] | Freq: Once | INTRAMUSCULAR | Status: AC
  Administered 2023-10-28: 155 [IU] via INTRAMUSCULAR

## 2023-10-28 NOTE — Progress Notes (Unsigned)
07/04/2023: Returns for repeat Botox.  Prior Botox 03/28/2023 with Dr. Lucia Gaskins.  Continued great benefit with use of Botox, can wear off in the last month having about 3 migraine days but otherwise well controlled. Use of Nurtec as needed with benefit, does have rx for rizatriptan as needed but has not recently used.  Tolerated procedure well today. will return in 3 months for repeat injection. Patient feels that her migraines cause her jaw to ache in her jaw aching also can cause her migraines to worsen it is a trigger for migraines included 10 units in each masseter to see if that helps with migraine severity.    Consent Form Botulism Toxin Injection For Chronic Migraine    Reviewed orally with patient, additionally signature is on file:  Botulism toxin has been approved by the Federal drug administration for treatment of chronic migraine. Botulism toxin does not cure chronic migraine and it may not be effective in some patients.  The administration of botulism toxin is accomplished by injecting a small amount of toxin into the muscles of the neck and head. Dosage must be titrated for each individual. Any benefits resulting from botulism toxin tend to wear off after 3 months with a repeat injection required if benefit is to be maintained. Injections are usually done every 3-4 months with maximum effect peak achieved by about 2 or 3 weeks. Botulism toxin is expensive and you should be sure of what costs you will incur resulting from the injection.  The side effects of botulism toxin use for chronic migraine may include:   -Transient, and usually mild, facial weakness with facial injections  -Transient, and usually mild, head or neck weakness with head/neck injections  -Reduction or loss of forehead facial animation due to forehead muscle weakness  -Eyelid drooping  -Dry eye  -Pain at the site of injection or bruising at the site of injection  -Double vision  -Potential unknown long term  risks   Contraindications: You should not have Botox if you are pregnant, nursing, allergic to albumin, have an infection, skin condition, or muscle weakness at the site of the injection, or have myasthenia gravis, Lambert-Eaton syndrome, or ALS.  It is also possible that as with any injection, there may be an allergic reaction or no effect from the medication. Reduced effectiveness after repeated injections is sometimes seen and rarely infection at the injection site may occur. All care will be taken to prevent these side effects. If therapy is given over a long time, atrophy and wasting in the muscle injected may occur. Occasionally the patient's become refractory to treatment because they develop antibodies to the toxin. In this event, therapy needs to be modified.  I have read the above information and consent to the administration of botulism toxin.    BOTOX PROCEDURE NOTE FOR MIGRAINE HEADACHE  Contraindications and precautions discussed with patient(above). Aseptic procedure was observed and patient tolerated procedure. Procedure performed by Ihor Austin, AGNP-BC.   The condition has existed for more than 6 months, and pt does not have a diagnosis of ALS, Myasthenia Gravis or Lambert-Eaton Syndrome.  Risks and benefits of injections discussed and pt agrees to proceed with the procedure.  Written consent obtained  These injections are medically necessary. Pt  receives good benefits from these injections. These injections do not cause sedations or hallucinations which the oral therapies may cause.   Description of procedure:  The patient was placed in a sitting position. The standard protocol was used for  Botox as follows, with 5 units of Botox injected at each site:  -Procerus muscle, midline injection  -Corrugator muscle, bilateral injection  -Frontalis muscle, bilateral injection, with 2 sites each side, medial injection was performed in the upper one third of the frontalis  muscle, in the region vertical from the medial inferior edge of the superior orbital rim. The lateral injection was again in the upper one third of the forehead vertically above the lateral limbus of the cornea, 1.5 cm lateral to the medial injection site.  -Temporalis muscle injection, 4 sites, bilaterally. The first injection was 3 cm above the tragus of the ear, second injection site was 1.5 cm to 3 cm up from the first injection site in line with the tragus of the ear. The third injection site was 1.5-3 cm forward between the first 2 injection sites. The fourth injection site was 1.5 cm posterior to the second injection site. 5th site laterally in the temporalis  muscleat the level of the outer canthus.  -Occipitalis muscle injection, 3 sites, bilaterally. The first injection was done one half way between the occipital protuberance and the tip of the mastoid process behind the ear. The second injection site was done lateral and superior to the first, 1 fingerbreadth from the first injection. The third injection site was 1 fingerbreadth superiorly and medially from the first injection site.  -Cervical paraspinal muscle injection, 2 sites, bilaterally. The first injection site was 1 cm from the midline of the cervical spine, 3 cm inferior to the lower border of the occipital protuberance. The second injection site was 1.5 cm superiorly and laterally to the first injection site.  -Trapezius muscle injection was performed at 3 sites, bilaterally. The first injection site was in the upper trapezius muscle halfway between the inflection point of the neck, and the acromion. The second injection site was one half way between the acromion and the first injection site. The third injection was done between the first injection site and the inflection point of the neck.    A total of 200 units of Botox was prepared, 155 units of Botox was injected as documented above, any Botox not injected was wasted. The  patient tolerated the procedure well, there were no complications of the above procedure.   Ihor Austin, AGNP-BC  [facility] [address] [place], [place] [address]  Phone [phone] Fax [phone] Note: This document was prepared with digital dictation and possible smart phrase technology. Any transcriptional errors that result from this process are unintentional.

## 2023-10-28 NOTE — Progress Notes (Unsigned)
Botox- 200 units x 1 vial Lot: [id number] Expiration: 11/2025 NDC: 1610-9604-54  Bacteriostatic 0.9% Sodium Chloride- * mL  Lot: [id number] Expiration: 08/01/2024 NDC: 1478-2956-21  Dx: 43.709 S/P Witnessed by Lennie Muckle, RN

## 2023-11-07 ENCOUNTER — Telehealth: Payer: Commercial Managed Care - PPO | Admitting: Family Medicine

## 2023-11-07 ENCOUNTER — Other Ambulatory Visit: Payer: Self-pay

## 2023-11-07 DIAGNOSIS — J019 Acute sinusitis, unspecified: Secondary | ICD-10-CM

## 2023-11-07 DIAGNOSIS — B9689 Other specified bacterial agents as the cause of diseases classified elsewhere: Secondary | ICD-10-CM

## 2023-11-07 MED ORDER — DOXYCYCLINE HYCLATE 100 MG PO TABS
100.0000 mg | ORAL_TABLET | Freq: Two times a day (BID) | ORAL | 0 refills | Status: AC
  Filled 2023-11-07: qty 14, 7d supply, fill #0

## 2023-11-07 NOTE — Progress Notes (Signed)
E-Visit for Sinus Problems  We are sorry that you are not feeling well.  Here is how we plan to help!  Based on what you have shared with me it looks like you have sinusitis.  Sinusitis is inflammation and infection in the sinus cavities of the head.  Based on your presentation I believe you most likely have Acute Bacterial Sinusitis.  This is an infection caused by bacteria and is treated with antibiotics. I have prescribed Doxycycline 100mg by mouth twice a day for 10 days. You may use an oral decongestant such as Mucinex D or if you have glaucoma or high blood pressure use plain Mucinex. Saline nasal spray help and can safely be used as often as needed for congestion.  If you develop worsening sinus pain, fever or notice severe headache and vision changes, or if symptoms are not better after completion of antibiotic, please schedule an appointment with a health care provider.    Sinus infections are not as easily transmitted as other respiratory infection, however we still recommend that you avoid close contact with loved ones, especially the very young and elderly.  Remember to wash your hands thoroughly throughout the day as this is the number one way to prevent the spread of infection!  Home Care: Only take medications as instructed by your medical team. Complete the entire course of an antibiotic. Do not take these medications with alcohol. A steam or ultrasonic humidifier can help congestion.  You can place a towel over your head and breathe in the steam from hot water coming from a faucet. Avoid close contacts especially the very young and the elderly. Cover your mouth when you cough or sneeze. Always remember to wash your hands.  Get Help Right Away If: You develop worsening fever or sinus pain. You develop a severe head ache or visual changes. Your symptoms persist after you have completed your treatment plan.  Make sure you Understand these instructions. Will watch your  condition. Will get help right away if you are not doing well or get worse.  Thank you for choosing an e-visit.  Your e-visit answers were reviewed by a board certified advanced clinical practitioner to complete your personal care plan. Depending upon the condition, your plan could have included both over the counter or prescription medications.  Please review your pharmacy choice. Make sure the pharmacy is open so you can pick up prescription now. If there is a problem, you may contact your provider through MyChart messaging and have the prescription routed to another pharmacy.  Your safety is important to us. If you have drug allergies check your prescription carefully.   For the next 24 hours you can use MyChart to ask questions about today's visit, request a non-urgent call back, or ask for a work or school excuse. You will get an email in the next two days asking about your experience. I hope that your e-visit has been valuable and will speed your recovery.  I provided 5 minutes of non face-to-face time during this encounter for chart review, medication and order placement, as well as and documentation.   

## 2023-11-16 ENCOUNTER — Other Ambulatory Visit: Payer: Self-pay | Admitting: Neurology

## 2023-11-16 DIAGNOSIS — F988 Other specified behavioral and emotional disorders with onset usually occurring in childhood and adolescence: Secondary | ICD-10-CM

## 2023-11-16 DIAGNOSIS — G47419 Narcolepsy without cataplexy: Secondary | ICD-10-CM

## 2023-11-18 ENCOUNTER — Other Ambulatory Visit: Payer: Self-pay | Admitting: *Deleted

## 2023-11-18 ENCOUNTER — Other Ambulatory Visit: Payer: Self-pay

## 2023-11-18 MED ORDER — AMPHETAMINE-DEXTROAMPHETAMINE 20 MG PO TABS
20.0000 mg | ORAL_TABLET | Freq: Two times a day (BID) | ORAL | 0 refills | Status: DC
  Filled 2023-11-18: qty 60, 30d supply, fill #0

## 2023-11-18 NOTE — Telephone Encounter (Signed)
 Last filled 10/15/2023 #60, last seen 10-28-2023 for botox. Next appt 01-27-2024.

## 2023-12-22 ENCOUNTER — Other Ambulatory Visit: Payer: Self-pay | Admitting: Neurology

## 2023-12-22 DIAGNOSIS — F988 Other specified behavioral and emotional disorders with onset usually occurring in childhood and adolescence: Secondary | ICD-10-CM

## 2023-12-22 DIAGNOSIS — G47419 Narcolepsy without cataplexy: Secondary | ICD-10-CM

## 2023-12-24 ENCOUNTER — Other Ambulatory Visit: Payer: Self-pay

## 2023-12-24 ENCOUNTER — Other Ambulatory Visit: Payer: Self-pay | Admitting: Neurology

## 2023-12-24 DIAGNOSIS — F988 Other specified behavioral and emotional disorders with onset usually occurring in childhood and adolescence: Secondary | ICD-10-CM

## 2023-12-24 DIAGNOSIS — G47419 Narcolepsy without cataplexy: Secondary | ICD-10-CM

## 2023-12-25 ENCOUNTER — Other Ambulatory Visit: Payer: Self-pay

## 2023-12-25 MED FILL — Amphetamine-Dextroamphetamine Tab 20 MG: ORAL | 30 days supply | Qty: 60 | Fill #0 | Status: AC

## 2024-01-07 ENCOUNTER — Other Ambulatory Visit (HOSPITAL_COMMUNITY): Payer: Self-pay

## 2024-01-10 ENCOUNTER — Other Ambulatory Visit: Payer: Self-pay

## 2024-01-20 ENCOUNTER — Other Ambulatory Visit: Payer: Self-pay | Admitting: Neurology

## 2024-01-20 ENCOUNTER — Other Ambulatory Visit: Payer: Self-pay

## 2024-01-20 MED ORDER — BOTOX 200 UNITS IJ SOLR
INTRAMUSCULAR | 3 refills | Status: AC
  Filled 2024-01-20: qty 1, 84d supply, fill #0
  Filled 2024-04-20 – 2024-04-29 (×2): qty 1, 84d supply, fill #1
  Filled 2024-07-23 – 2024-09-10 (×4): qty 1, 84d supply, fill #2

## 2024-01-20 NOTE — Progress Notes (Signed)
 Specialty Pharmacy Refill Coordination Note  DEBBE CRUMBLE is a 33 y.o. female assessed today regarding refills of clinic administered specialty medication(s) OnabotulinumtoxinA  (Botox )   Clinic requested Courier to Provider Office   Delivery date: 01/21/24   Verified address: [facility], [address], [place], [address]   Medication will be filled on 04.21.25.

## 2024-01-25 ENCOUNTER — Other Ambulatory Visit: Payer: Self-pay | Admitting: Neurology

## 2024-01-25 DIAGNOSIS — F988 Other specified behavioral and emotional disorders with onset usually occurring in childhood and adolescence: Secondary | ICD-10-CM

## 2024-01-25 DIAGNOSIS — G47419 Narcolepsy without cataplexy: Secondary | ICD-10-CM

## 2024-01-27 ENCOUNTER — Other Ambulatory Visit: Payer: Self-pay

## 2024-01-27 ENCOUNTER — Other Ambulatory Visit: Payer: Self-pay | Admitting: Neurology

## 2024-01-27 ENCOUNTER — Ambulatory Visit: Payer: Commercial Managed Care - PPO | Admitting: Neurology

## 2024-01-27 DIAGNOSIS — F988 Other specified behavioral and emotional disorders with onset usually occurring in childhood and adolescence: Secondary | ICD-10-CM

## 2024-01-27 DIAGNOSIS — G47419 Narcolepsy without cataplexy: Secondary | ICD-10-CM

## 2024-01-27 DIAGNOSIS — G43709 Chronic migraine without aura, not intractable, without status migrainosus: Secondary | ICD-10-CM | POA: Diagnosis not present

## 2024-01-27 MED ORDER — AMPHETAMINE-DEXTROAMPHETAMINE 20 MG PO TABS
20.0000 mg | ORAL_TABLET | Freq: Two times a day (BID) | ORAL | 0 refills | Status: DC
  Filled 2024-01-27: qty 60, 30d supply, fill #0

## 2024-01-27 MED ORDER — ONABOTULINUMTOXINA 200 UNITS IJ SOLR
155.0000 [IU] | Freq: Once | INTRAMUSCULAR | Status: AC
  Administered 2024-01-27: 155 [IU] via INTRAMUSCULAR

## 2024-01-27 NOTE — Progress Notes (Signed)
 01/27/2024: Doin well, stable  07/04/2023: Returns for repeat Botox .  Prior Botox  03/28/2023 with Dr. Tresia Fruit.  Continued great benefit with use of Botox , can wear off in the last month having about 3 migraine days but otherwise well controlled. Use of Nurtec as needed with benefit, does have rx for rizatriptan  as needed but has not recently used.  Tolerated procedure well today. will return in 3 months for repeat injection. Patient feels that her migraines cause her jaw to ache in her jaw aching also can cause her migraines to worsen it is a trigger for migraines included 10 units in each masseter to see if that helps with migraine severity.    Consent Form Botulism Toxin Injection For Chronic Migraine    Reviewed orally with patient, additionally signature is on file:  Botulism toxin has been approved by the Federal drug administration for treatment of chronic migraine. Botulism toxin does not cure chronic migraine and it may not be effective in some patients.  The administration of botulism toxin is accomplished by injecting a small amount of toxin into the muscles of the neck and head. Dosage must be titrated for each individual. Any benefits resulting from botulism toxin tend to wear off after 3 months with a repeat injection required if benefit is to be maintained. Injections are usually done every 3-4 months with maximum effect peak achieved by about 2 or 3 weeks. Botulism toxin is expensive and you should be sure of what costs you will incur resulting from the injection.  The side effects of botulism toxin use for chronic migraine may include:   -Transient, and usually mild, facial weakness with facial injections  -Transient, and usually mild, head or neck weakness with head/neck injections  -Reduction or loss of forehead facial animation due to forehead muscle weakness  -Eyelid drooping  -Dry eye  -Pain at the site of injection or bruising at the site of injection  -Double  vision  -Potential unknown long term risks   Contraindications: You should not have Botox  if you are pregnant, nursing, allergic to albumin, have an infection, skin condition, or muscle weakness at the site of the injection, or have myasthenia gravis, Lambert-Eaton syndrome, or ALS.  It is also possible that as with any injection, there may be an allergic reaction or no effect from the medication. Reduced effectiveness after repeated injections is sometimes seen and rarely infection at the injection site may occur. All care will be taken to prevent these side effects. If therapy is given over a long time, atrophy and wasting in the muscle injected may occur. Occasionally the patient's become refractory to treatment because they develop antibodies to the toxin. In this event, therapy needs to be modified.  I have read the above information and consent to the administration of botulism toxin.    BOTOX  PROCEDURE NOTE FOR MIGRAINE HEADACHE  Contraindications and precautions discussed with patient(above). Aseptic procedure was observed and patient tolerated procedure. Procedure performed by Johny Nap, AGNP-BC.   The condition has existed for more than 6 months, and pt does not have a diagnosis of ALS, Myasthenia Gravis or Lambert-Eaton Syndrome.  Risks and benefits of injections discussed and pt agrees to proceed with the procedure.  Written consent obtained  These injections are medically necessary. Pt  receives good benefits from these injections. These injections do not cause sedations or hallucinations which the oral therapies may cause.   Description of procedure:  The patient was placed in a sitting position. The standard protocol  was used for Botox  as follows, with 5 units of Botox  injected at each site:  -Procerus muscle, midline injection  -Corrugator muscle, bilateral injection  -Frontalis muscle, bilateral injection, with 2 sites each side, medial injection was performed in the  upper one third of the frontalis muscle, in the region vertical from the medial inferior edge of the superior orbital rim. The lateral injection was again in the upper one third of the forehead vertically above the lateral limbus of the cornea, 1.5 cm lateral to the medial injection site.  -Temporalis muscle injection, 4 sites, bilaterally. The first injection was 3 cm above the tragus of the ear, second injection site was 1.5 cm to 3 cm up from the first injection site in line with the tragus of the ear. The third injection site was 1.5-3 cm forward between the first 2 injection sites. The fourth injection site was 1.5 cm posterior to the second injection site. 5th site laterally in the temporalis  muscleat the level of the outer canthus.  -Occipitalis muscle injection, 3 sites, bilaterally. The first injection was done one half way between the occipital protuberance and the tip of the mastoid process behind the ear. The second injection site was done lateral and superior to the first, 1 fingerbreadth from the first injection. The third injection site was 1 fingerbreadth superiorly and medially from the first injection site.  -Cervical paraspinal muscle injection, 2 sites, bilaterally. The first injection site was 1 cm from the midline of the cervical spine, 3 cm inferior to the lower border of the occipital protuberance. The second injection site was 1.5 cm superiorly and laterally to the first injection site.  -Trapezius muscle injection was performed at 3 sites, bilaterally. The first injection site was in the upper trapezius muscle halfway between the inflection point of the neck, and the acromion. The second injection site was one half way between the acromion and the first injection site. The third injection was done between the first injection site and the inflection point of the neck.    A total of 200 units of Botox  was prepared, 155 units of Botox  was injected as documented above, 45U Botox   not injected was wasted. The patient tolerated the procedure well, there were no complications of the above procedure.   Aldona Amel  [facility] [address] [place], [place] [address]  Phone [phone] Fax [phone]

## 2024-01-27 NOTE — Progress Notes (Deleted)
 Botox - 200 units x 1 vial Lot: [id number] Expiration: 12/2025 NDC: 1191-4782-95  Bacteriostatic 0.9% Sodium Chloride - *** mL  Lot: *** Expiration: *** NDC: ***  Dx: G43.709 S/P  Witnessed by ***

## 2024-01-27 NOTE — Progress Notes (Signed)
 Botox  consent signed  Botox - 200 units x 1 vial Lot: [id number] Expiration: 12/2025 NDC: 1610-9604-54  Bacteriostatic 0.9% Sodium Chloride - 4 mL  Lot: [id number] Expiration: 08/01/2024 NDC: 1478-2956-21  Dx: H08.657 S/P  Witnessed by Marcelline Sermons CMA

## 2024-01-28 MED FILL — Amphetamine-Dextroamphetamine Tab 20 MG: ORAL | 30 days supply | Qty: 60 | Fill #0 | Status: CN

## 2024-01-28 NOTE — Telephone Encounter (Signed)
 Can you decline? Looks like you already sent in on 01/27/24

## 2024-01-29 ENCOUNTER — Other Ambulatory Visit: Payer: Self-pay

## 2024-02-10 ENCOUNTER — Other Ambulatory Visit (HOSPITAL_COMMUNITY): Payer: Self-pay

## 2024-02-10 ENCOUNTER — Telehealth: Payer: Self-pay

## 2024-02-10 NOTE — Telephone Encounter (Signed)
 Pharmacy Patient Advocate Encounter   Received notification from CoverMyMeds that prior authorization for Nurtec 75MG  dispersible tablets is required/requested.   Insurance verification completed.   The patient is insured through [facility] .   Per test claim: The current 30 day co-pay is, $0.00.  No PA needed at this time. This test claim was processed through [facility]- copay amounts may vary at other pharmacies due to pharmacy/plan contracts, or as the patient moves through the different stages of their insurance plan.    Maximum of 16 tablets per 30 days

## 2024-03-02 ENCOUNTER — Other Ambulatory Visit: Payer: Self-pay

## 2024-03-02 MED FILL — Amphetamine-Dextroamphetamine Tab 20 MG: ORAL | 30 days supply | Qty: 60 | Fill #0 | Status: AC

## 2024-03-03 ENCOUNTER — Other Ambulatory Visit: Payer: Self-pay

## 2024-03-03 ENCOUNTER — Ambulatory Visit (INDEPENDENT_AMBULATORY_CARE_PROVIDER_SITE_OTHER): Admitting: Family

## 2024-03-03 VITALS — BP 120/70 | HR 66 | Temp 98.2°F | Ht 59.0 in | Wt 168.2 lb

## 2024-03-03 DIAGNOSIS — Z23 Encounter for immunization: Secondary | ICD-10-CM | POA: Diagnosis not present

## 2024-03-03 DIAGNOSIS — Z0001 Encounter for general adult medical examination with abnormal findings: Secondary | ICD-10-CM | POA: Diagnosis not present

## 2024-03-03 DIAGNOSIS — R899 Unspecified abnormal finding in specimens from other organs, systems and tissues: Secondary | ICD-10-CM | POA: Diagnosis not present

## 2024-03-03 DIAGNOSIS — R591 Generalized enlarged lymph nodes: Secondary | ICD-10-CM

## 2024-03-03 DIAGNOSIS — Z0000 Encounter for general adult medical examination without abnormal findings: Secondary | ICD-10-CM

## 2024-03-03 LAB — TSH: TSH: 3.35 u[IU]/mL (ref 0.35–5.50)

## 2024-03-03 LAB — LIPID PANEL
Cholesterol: 231 mg/dL — ABNORMAL HIGH (ref 0–200)
HDL: 78.7 mg/dL (ref >39.00)
LDL Cholesterol: 122 mg/dL — ABNORMAL HIGH (ref 0–99)
NonHDL: 152.18
Total CHOL/HDL Ratio: 3
Triglycerides: 149 mg/dL (ref 0.0–149.0)
VLDL: 29.8 mg/dL (ref 0.0–40.0)

## 2024-03-03 LAB — VITAMIN D 25 HYDROXY (VIT D DEFICIENCY, FRACTURES): VITD: 25.97 ng/mL — ABNORMAL LOW (ref 30.00–100.00)

## 2024-03-03 LAB — CBC WITH DIFFERENTIAL/PLATELET
Basophils Absolute: 0.1 10*3/uL (ref 0.0–0.1)
Basophils Relative: 0.5 % (ref 0.0–3.0)
Eosinophils Absolute: 0.5 10*3/uL (ref 0.0–0.7)
Eosinophils Relative: 4.7 % (ref 0.0–5.0)
HCT: 35.5 % — ABNORMAL LOW (ref 36.0–46.0)
Hemoglobin: 12.4 g/dL (ref 12.0–15.0)
Lymphocytes Relative: 24 % (ref 12.0–46.0)
Lymphs Abs: 2.4 10*3/uL (ref 0.7–4.0)
MCHC: 35 g/dL (ref 30.0–36.0)
MCV: 95.1 fl (ref 78.0–100.0)
Monocytes Absolute: 0.4 10*3/uL (ref 0.1–1.0)
Monocytes Relative: 4.5 % (ref 3.0–12.0)
Neutro Abs: 6.6 10*3/uL (ref 1.4–7.7)
Neutrophils Relative %: 66.3 % (ref 43.0–77.0)
Platelets: 314 10*3/uL (ref 150.0–400.0)
RBC: 3.73 Mil/uL — ABNORMAL LOW (ref 3.87–5.11)
RDW: 12.1 % (ref 11.5–15.5)
WBC: 9.9 10*3/uL (ref 4.0–10.5)

## 2024-03-03 LAB — COMPREHENSIVE METABOLIC PANEL WITH GFR
ALT: 13 U/L (ref 0–35)
AST: 16 U/L (ref 0–37)
Albumin: 4.1 g/dL (ref 3.5–5.2)
Alkaline Phosphatase: 54 U/L (ref 39–117)
BUN: 8 mg/dL (ref 6–23)
CO2: 22 meq/L (ref 19–32)
Calcium: 9 mg/dL (ref 8.4–10.5)
Chloride: 104 meq/L (ref 96–112)
Creatinine, Ser: 0.7 mg/dL (ref 0.40–1.20)
GFR: 112.13 mL/min (ref >60.00)
Glucose, Bld: 85 mg/dL (ref 70–99)
Potassium: 4.1 meq/L (ref 3.5–5.1)
Sodium: 134 meq/L — ABNORMAL LOW (ref 135–145)
Total Bilirubin: 0.4 mg/dL (ref 0.2–1.2)
Total Protein: 7.2 g/dL (ref 6.0–8.3)

## 2024-03-03 LAB — HEMOGLOBIN A1C: Hgb A1c MFr Bld: 4.6 % (ref 4.6–6.5)

## 2024-03-03 NOTE — Addendum Note (Signed)
 Addended by: Reynolds Cea A on: 03/03/2024 02:50 PM   Modules accepted: Orders

## 2024-03-03 NOTE — Assessment & Plan Note (Signed)
 Unable to palpate on exam. Question right postauricular asymmetry . In absence of accompanying URI sxs and duration, we agreed to proceed with Ct soft tissue neck. Pending cbc as well.

## 2024-03-03 NOTE — Patient Instructions (Signed)
 I have ordered CT of neck to evaluate for lymph node  Let us  know if you dont hear back within a week in regards to an appointment being scheduled.   So that you are aware, if you are [facility] MyChart user , please pay attention to your MyChart messages as you may receive a MyChart message with a phone number to call and schedule this test/appointment own your own from our referral coordinator. This is a new process so I do not want you to miss this message.  If you are not a MyChart user, you will receive a phone call.   Health Maintenance, Female Adopting a healthy lifestyle and getting preventive care are important in promoting health and wellness. Ask your health care provider about: The right schedule for you to have regular tests and exams. Things you can do on your own to prevent diseases and keep yourself healthy. What should I know about diet, weight, and exercise? Eat a healthy diet  Eat a diet that includes plenty of vegetables, fruits, low-fat dairy products, and lean protein. Do not eat a lot of foods that are high in solid fats, added sugars, or sodium. Maintain a healthy weight Body mass index (BMI) is used to identify weight problems. It estimates body fat based on height and weight. Your health care provider can help determine your BMI and help you achieve or maintain a healthy weight. Get regular exercise Get regular exercise. This is one of the most important things you can do for your health. Most adults should: Exercise for at least 150 minutes each week. The exercise should increase your heart rate and make you sweat (moderate-intensity exercise). Do strengthening exercises at least twice a week. This is in addition to the moderate-intensity exercise. Spend less time sitting. Even light physical activity can be beneficial. Watch cholesterol and blood lipids Have your blood tested for lipids and cholesterol at 35 years of age, then have this test every 5 years. Have your  cholesterol levels checked more often if: Your lipid or cholesterol levels are high. You are older than 35 years of age. You are at high risk for heart disease. What should I know about cancer screening? Depending on your health history and family history, you may need to have cancer screening at various ages. This may include screening for: Breast cancer. Cervical cancer. Colorectal cancer. Skin cancer. Lung cancer. What should I know about heart disease, diabetes, and high blood pressure? Blood pressure and heart disease High blood pressure causes heart disease and increases the risk of stroke. This is more likely to develop in people who have high blood pressure readings or are overweight. Have your blood pressure checked: Every 3-5 years if you are 53-28 years of age. Every year if you are 13 years old or older. Diabetes Have regular diabetes screenings. This checks your fasting blood sugar level. Have the screening done: Once every three years after age 16 if you are at a normal weight and have a low risk for diabetes. More often and at a younger age if you are overweight or have a high risk for diabetes. What should I know about preventing infection? Hepatitis B If you have a higher risk for hepatitis B, you should be screened for this virus. Talk with your health care provider to find out if you are at risk for hepatitis B infection. Hepatitis C Testing is recommended for: Everyone born from [birth year] through 1967. Anyone with known risk factors for hepatitis C. Sexually  transmitted infections (STIs) Get screened for STIs, including gonorrhea and chlamydia, if: You are sexually active and are younger than 35 years of age. You are older than 35 years of age and your health care provider tells you that you are at risk for this type of infection. Your sexual activity has changed since you were last screened, and you are at increased risk for chlamydia or gonorrhea. Ask your health care  provider if you are at risk. Ask your health care provider about whether you are at high risk for HIV. Your health care provider may recommend a prescription medicine to help prevent HIV infection. If you choose to take medicine to prevent HIV, you should first get tested for HIV. You should then be tested every 3 months for as long as you are taking the medicine. Pregnancy If you are about to stop having your period (premenopausal) and you may become pregnant, seek counseling before you get pregnant. Take 400 to 800 micrograms (mcg) of folic acid  every day if you become pregnant. Ask for birth control (contraception) if you want to prevent pregnancy. Osteoporosis and menopause Osteoporosis is a disease in which the bones lose minerals and strength with aging. This can result in bone fractures. If you are 33 years old or older, or if you are at risk for osteoporosis and fractures, ask your health care provider if you should: Be screened for bone loss. Take a calcium or vitamin D  supplement to lower your risk of fractures. Be given hormone replacement therapy (HRT) to treat symptoms of menopause. Follow these instructions at home: Alcohol use Do not drink alcohol if: Your health care provider tells you not to drink. You are pregnant, may be pregnant, or are planning to become pregnant. If you drink alcohol: Limit how much you have to: 0-1 drink a day. Know how much alcohol is in your drink. In the [place] one drink equals one 12 oz bottle of beer (355 mL), one 5 oz glass of wine (148 mL), or one 1 oz glass of hard liquor (44 mL). Lifestyle Do not use any products that contain nicotine or tobacco. These products include cigarettes, chewing tobacco, and vaping devices, such as e-cigarettes. If you need help quitting, ask your health care provider. Do not use street drugs. Do not share needles. Ask your health care provider for help if you need support or information about quitting drugs. General  instructions Schedule regular health, dental, and eye exams. Stay current with your vaccines. Tell your health care provider if: You often feel depressed. You have ever been abused or do not feel safe at home. Summary Adopting a healthy lifestyle and getting preventive care are important in promoting health and wellness. Follow your health care provider's instructions about healthy diet, exercising, and getting tested or screened for diseases. Follow your health care provider's instructions on monitoring your cholesterol and blood pressure. This information is not intended to replace advice given to you by your health care provider. Make sure you discuss any questions you have with your health care provider. Document Revised: 02/06/2021 Document Reviewed: 02/06/2021 Elsevier Patient Education  2024 [employer].

## 2024-03-03 NOTE — Progress Notes (Signed)
 Assessment & Plan:  Annual physical exam Assessment & Plan: CBE performed. Deferred pelvic exam in the absence of symptoms and she plans to continue to follow with GYN. Encouraged more regular exercise.   Orders: -     TSH -     CBC with Differential/Platelet -     Comprehensive metabolic panel with GFR -     Hemoglobin A1c -     Lipid panel -     VITAMIN D  25 Hydroxy (Vit-D Deficiency, Fractures)  Lymphadenopathy Assessment & Plan: Unable to palpate on exam. Question right postauricular asymmetry . In absence of accompanying URI sxs and duration, we agreed to proceed with Ct soft tissue neck. Pending cbc as well.   Orders: -     CBC with Differential/Platelet -     CT SOFT TISSUE NECK W CONTRAST; Future     Return precautions given.   Risks, benefits, and alternatives of the medications and treatment plan prescribed today were discussed, and patient expressed understanding.   Education regarding symptom management and diagnosis given to patient on AVS either electronically or printed.  No follow-ups on file.  Bascom Bossier, FNP  Subjective:    Patient ID: Brooke Fox, female    DOB: [birth year], 33 y.o.   MRN: [id number]  CC: Brooke Fox is a 33 y.o. female who presents today for physical exam.    HPI: Complains of right postauricular suspected enlarged lymph node, x 6 months, unchanged in size.   No recent URI , vaccinations.      No early family history of colon or breast cancer.  Cervical Cancer Screening:UTD she follows with GYN; negative HPV, malignancy 12/07/21          Tetanus - due         Exercise: No regular exercise. Hiking on the weekend.  Alcohol use:  none Smoking/tobacco use: Nonsmoker.    Health Maintenance  Topic Date Due   Pap with HPV screening  05/30/2021   DTaP/Tdap/Td vaccine (2 - Td or Tdap) 10/01/2022   Flu Shot  05/01/2024   HPV Vaccine  Aged Out   Meningitis B Vaccine  Aged Out   COVID-19 Vaccine  Discontinued    Hepatitis C Screening  Discontinued   HIV Screening  Discontinued    ALLERGIES: Patient has no known allergies.  Current Outpatient Medications on File Prior to Visit  Medication Sig Dispense Refill   amphetamine -dextroamphetamine  (ADDERALL) 20 MG tablet Take 1 tablet (20 mg total) by mouth 2 (two) times daily. 60 tablet 0   amphetamine -dextroamphetamine  (ADDERALL) 20 MG tablet Take 1 tablet (20 mg total) by mouth 2 (two) times daily. 60 tablet 0   botulinum toxin Type A  (BOTOX ) 200 units injection Provider inject 155 units into the muscles of the head and neck every 12 weeks. Discard remainder. 1 each 3   Galcanezumab-gnlm (EMGALITY) 120 MG/ML SOAJ Inject 120 mg into the skin every 30 (thirty) days.     lidocaine  (XYLOCAINE ) 2 % solution Use as directed 15 mLs in the mouth or throat as needed for mouth pain. 100 mL 0   Melatonin 10 MG CAPS Take by mouth.     norgestimate -ethinyl estradiol  (ORTHO-CYCLEN) 0.25-35 MG-MCG tablet Take 1 tablet by mouth daily. 84 tablet 3   ondansetron  (ZOFRAN ) 4 MG tablet Take 1 tablet (4 mg total) by mouth every 8 (eight) hours as needed for nausea or vomiting. 20 tablet 0   propranolol  ER (INDERAL  LA) 60 MG 24 hr capsule  Take 1 capsule (60 mg total) by mouth at bedtime. 90 capsule 3   Rimegepant Sulfate  (NURTEC) 75 MG TBDP Dissolve 1 tablet (75 mg total) by mouth daily as needed. 16 tablet 11   rizatriptan  (MAXALT -MLT) 10 MG disintegrating tablet DISSOLVE 1 TABLET BY MOUTH DAILY AS NEEDED FOR MIGRAINE. MAY REPEAT IN 2 HOURS IF NEEDED. 9 tablet 11   No current facility-administered medications on file prior to visit.    Review of Systems  Constitutional:  Negative for chills and fever.  HENT:  Negative for congestion, rhinorrhea, sinus pressure, sinus pain and trouble swallowing.   Respiratory:  Negative for cough and shortness of breath.   Cardiovascular:  Negative for chest pain and palpitations.  Gastrointestinal:  Negative for nausea and vomiting.   Hematological:  Positive for adenopathy.      Objective:    BP 120/70   Pulse 66   Temp 98.2 F (36.8 C)   Ht 4\' 11"  (1.499 m)   Wt 168 lb 3.2 oz (76.3 kg)   SpO2 99%   BMI 33.97 kg/m   BP Readings from Last 3 Encounters:  03/03/24 120/70  11/19/22 118/70  06/27/22 108/70   Wt Readings from Last 3 Encounters:  03/03/24 168 lb 3.2 oz (76.3 kg)  11/19/22 147 lb 6.4 oz (66.9 kg)  06/27/22 132 lb (59.9 kg)    Physical Exam Vitals reviewed.  Constitutional:      Appearance: Normal appearance. She is well-developed.  Eyes:     Conjunctiva/sclera: Conjunctivae normal.  Neck:     Thyroid : No thyroid  mass or thyromegaly.  Cardiovascular:     Rate and Rhythm: Normal rate and regular rhythm.     Pulses: Normal pulses.     Heart sounds: Normal heart sounds.  Pulmonary:     Effort: Pulmonary effort is normal.     Breath sounds: Normal breath sounds. No wheezing, rhonchi or rales.  Chest:  Breasts:    Breasts are symmetrical.     Right: No inverted nipple, mass, nipple discharge, skin change or tenderness.     Left: No inverted nipple, mass, nipple discharge, skin change or tenderness.  Abdominal:     General: Bowel sounds are normal. There is no distension.     Palpations: Abdomen is soft. Abdomen is not rigid. There is no fluid wave or mass.     Tenderness: There is no abdominal tenderness. There is no guarding or rebound.  Lymphadenopathy:     Head:     Right side of head: Posterior auricular (subtle asymmetry; poorly circumscribed) adenopathy present. No submental, submandibular, tonsillar, preauricular or occipital adenopathy.     Left side of head: No submental, submandibular, tonsillar, preauricular, posterior auricular or occipital adenopathy.     Cervical: No cervical adenopathy.     Right cervical: No superficial, deep or posterior cervical adenopathy.    Left cervical: No superficial, deep or posterior cervical adenopathy.  Skin:    General: Skin is warm and  dry.  Neurological:     Mental Status: She is alert.  Psychiatric:        Speech: Speech normal.        Behavior: Behavior normal.        Thought Content: Thought content normal.

## 2024-03-03 NOTE — Assessment & Plan Note (Signed)
 CBE performed. Deferred pelvic exam in the absence of symptoms and she plans to continue to follow with GYN. Encouraged more regular exercise.

## 2024-03-06 ENCOUNTER — Encounter: Payer: Self-pay | Admitting: Family

## 2024-03-06 NOTE — Telephone Encounter (Signed)
 Spoke to Brooke Fox she stated that she is ok with waiting until Mon when Daivd Dub returns  does not think she needs to go to [facility]. She will upload pic in my chart for review and then we can go from there and get her scheduled if she needs anh appt to come into office!

## 2024-03-07 ENCOUNTER — Ambulatory Visit: Payer: Self-pay | Admitting: Family

## 2024-03-07 DIAGNOSIS — Z8639 Personal history of other endocrine, nutritional and metabolic disease: Secondary | ICD-10-CM

## 2024-03-07 MED ORDER — CHOLECALCIFEROL 1.25 MG (50000 UT) PO CAPS
50000.0000 [IU] | ORAL_CAPSULE | ORAL | 0 refills | Status: AC
  Filled 2024-03-07: qty 8, 56d supply, fill #0

## 2024-03-08 ENCOUNTER — Other Ambulatory Visit: Payer: Self-pay

## 2024-03-09 ENCOUNTER — Other Ambulatory Visit: Payer: Self-pay

## 2024-03-09 NOTE — Addendum Note (Signed)
 Addended by: Tulsi Crossett on: 03/09/2024 04:15 PM   Modules accepted: Orders

## 2024-03-10 NOTE — Addendum Note (Signed)
 Addended by: Geniyah Eischeid on: 03/10/2024 02:55 PM   Modules accepted: Orders

## 2024-03-16 ENCOUNTER — Other Ambulatory Visit: Payer: Self-pay

## 2024-03-16 ENCOUNTER — Ambulatory Visit

## 2024-03-16 DIAGNOSIS — Z6836 Body mass index (BMI) 36.0-36.9, adult: Secondary | ICD-10-CM | POA: Diagnosis not present

## 2024-03-16 DIAGNOSIS — Z01419 Encounter for gynecological examination (general) (routine) without abnormal findings: Secondary | ICD-10-CM | POA: Diagnosis not present

## 2024-03-16 DIAGNOSIS — Z309 Encounter for contraceptive management, unspecified: Secondary | ICD-10-CM | POA: Diagnosis not present

## 2024-03-16 MED ORDER — NORGESTIMATE-ETH ESTRADIOL 0.25-35 MG-MCG PO TABS
1.0000 | ORAL_TABLET | Freq: Every day | ORAL | 3 refills | Status: AC
  Filled 2024-03-16 – 2024-04-16 (×2): qty 84, 84d supply, fill #0
  Filled 2024-07-09: qty 84, 84d supply, fill #1
  Filled 2024-09-30: qty 84, 84d supply, fill #2

## 2024-03-17 ENCOUNTER — Ambulatory Visit: Admission: RE | Admit: 2024-03-17 | Source: Ambulatory Visit

## 2024-03-20 ENCOUNTER — Other Ambulatory Visit: Payer: Self-pay

## 2024-03-25 ENCOUNTER — Other Ambulatory Visit (INDEPENDENT_AMBULATORY_CARE_PROVIDER_SITE_OTHER)

## 2024-03-25 DIAGNOSIS — R899 Unspecified abnormal finding in specimens from other organs, systems and tissues: Secondary | ICD-10-CM

## 2024-03-25 LAB — BASIC METABOLIC PANEL WITH GFR
BUN: 10 mg/dL (ref 6–23)
CO2: 25 meq/L (ref 19–32)
Calcium: 9.2 mg/dL (ref 8.4–10.5)
Chloride: 102 meq/L (ref 96–112)
Creatinine, Ser: 0.67 mg/dL (ref 0.40–1.20)
GFR: 113.28 mL/min (ref >60.00)
Glucose, Bld: 79 mg/dL (ref 70–99)
Potassium: 4 meq/L (ref 3.5–5.1)
Sodium: 134 meq/L — ABNORMAL LOW (ref 135–145)

## 2024-04-06 ENCOUNTER — Other Ambulatory Visit (HOSPITAL_COMMUNITY): Payer: Self-pay

## 2024-04-07 ENCOUNTER — Other Ambulatory Visit: Payer: Self-pay | Admitting: Neurology

## 2024-04-07 ENCOUNTER — Other Ambulatory Visit: Payer: Self-pay

## 2024-04-07 DIAGNOSIS — G47419 Narcolepsy without cataplexy: Secondary | ICD-10-CM

## 2024-04-07 DIAGNOSIS — F988 Other specified behavioral and emotional disorders with onset usually occurring in childhood and adolescence: Secondary | ICD-10-CM

## 2024-04-07 MED FILL — Amphetamine-Dextroamphetamine Tab 20 MG: ORAL | 30 days supply | Qty: 60 | Fill #0 | Status: AC

## 2024-04-07 NOTE — Telephone Encounter (Signed)
 Last refill-12/2023 Last OV-06/2022 Please advise

## 2024-04-08 ENCOUNTER — Other Ambulatory Visit: Payer: Self-pay

## 2024-04-09 ENCOUNTER — Telehealth: Payer: Self-pay | Admitting: Neurology

## 2024-04-09 NOTE — Telephone Encounter (Signed)
 LVM and sent MyChart msg informing pt of r/s needed for Botox  appt- MD out.

## 2024-04-16 ENCOUNTER — Other Ambulatory Visit: Payer: Self-pay

## 2024-04-17 ENCOUNTER — Other Ambulatory Visit: Payer: Self-pay

## 2024-04-17 ENCOUNTER — Other Ambulatory Visit: Payer: Self-pay | Admitting: Family

## 2024-04-17 DIAGNOSIS — R899 Unspecified abnormal finding in specimens from other organs, systems and tissues: Secondary | ICD-10-CM

## 2024-04-17 NOTE — Progress Notes (Signed)
 bmp

## 2024-04-20 ENCOUNTER — Other Ambulatory Visit (HOSPITAL_COMMUNITY): Payer: Self-pay

## 2024-04-29 ENCOUNTER — Other Ambulatory Visit: Payer: Self-pay

## 2024-04-29 NOTE — Progress Notes (Signed)
 Specialty Pharmacy Refill Coordination Note  Brooke Fox is a 33 y.o. female assessed today regarding refills of clinic administered specialty medication(s) OnabotulinumtoxinA  (Botox )   Clinic requested Courier to Provider Office   Delivery date: 05/04/24   Verified address: [facility], [address] [place], [address]   Medication will be filled on 08.01.25.

## 2024-04-30 ENCOUNTER — Other Ambulatory Visit: Payer: Self-pay

## 2024-04-30 ENCOUNTER — Ambulatory Visit: Admitting: Neurology

## 2024-05-11 ENCOUNTER — Ambulatory Visit: Admitting: Neurology

## 2024-05-11 VITALS — BP 135/69 | HR 83

## 2024-05-11 DIAGNOSIS — G47419 Narcolepsy without cataplexy: Secondary | ICD-10-CM

## 2024-05-11 DIAGNOSIS — F909 Attention-deficit hyperactivity disorder, unspecified type: Secondary | ICD-10-CM

## 2024-05-11 DIAGNOSIS — F988 Other specified behavioral and emotional disorders with onset usually occurring in childhood and adolescence: Secondary | ICD-10-CM

## 2024-05-11 DIAGNOSIS — G43709 Chronic migraine without aura, not intractable, without status migrainosus: Secondary | ICD-10-CM

## 2024-05-11 MED ORDER — ONABOTULINUMTOXINA 200 UNITS IJ SOLR
155.0000 [IU] | Freq: Once | INTRAMUSCULAR | Status: AC
Start: 2024-05-11 — End: 2024-05-11
  Administered 2024-05-11 (×2): 155 [IU] via INTRAMUSCULAR

## 2024-05-11 NOTE — Progress Notes (Signed)
 Botox - 200 units x 1 vial Lot: [id number] Expiration: 08/2026 NDC: 9976-6078-97  Bacteriostatic 0.9% Sodium Chloride - 4 mL  Lot: [id number] Expiration: 07/2025 NDC: 9590-8033-97  Dx: H56.290 S/P  Witnessed by Particia PEAK

## 2024-05-11 NOTE — Progress Notes (Signed)
 05/11/2024: doing excellent, >>60% improvemen tin migraine freq and severiy 01/27/2024: Doin well, stable  07/04/2023: Returns for repeat Botox .  Prior Botox  03/28/2023 with Dr. Ines.  Continued great benefit with use of Botox , can wear off in the last month having about 3 migraine days but otherwise well controlled. Use of Nurtec as needed with benefit, does have rx for rizatriptan  as needed but has not recently used.  Tolerated procedure well today. will return in 3 months for repeat injection. Patient feels that her migraines cause her jaw to ache in her jaw aching also can cause her migraines to worsen it is a trigger for migraines included 10 units in each masseter to see if that helps with migraine severity.    Consent Form Botulism Toxin Injection For Chronic Migraine    Reviewed orally with patient, additionally signature is on file:  Botulism toxin has been approved by the Federal drug administration for treatment of chronic migraine. Botulism toxin does not cure chronic migraine and it may not be effective in some patients.  The administration of botulism toxin is accomplished by injecting a small amount of toxin into the muscles of the neck and head. Dosage must be titrated for each individual. Any benefits resulting from botulism toxin tend to wear off after 3 months with a repeat injection required if benefit is to be maintained. Injections are usually done every 3-4 months with maximum effect peak achieved by about 2 or 3 weeks. Botulism toxin is expensive and you should be sure of what costs you will incur resulting from the injection.  The side effects of botulism toxin use for chronic migraine may include:   -Transient, and usually mild, facial weakness with facial injections  -Transient, and usually mild, head or neck weakness with head/neck injections  -Reduction or loss of forehead facial animation due to forehead muscle weakness  -Eyelid drooping  -Dry eye  -Pain at  the site of injection or bruising at the site of injection  -Double vision  -Potential unknown long term risks   Contraindications: You should not have Botox  if you are pregnant, nursing, allergic to albumin, have an infection, skin condition, or muscle weakness at the site of the injection, or have myasthenia gravis, Lambert-Eaton syndrome, or ALS.  It is also possible that as with any injection, there may be an allergic reaction or no effect from the medication. Reduced effectiveness after repeated injections is sometimes seen and rarely infection at the injection site may occur. All care will be taken to prevent these side effects. If therapy is given over a long time, atrophy and wasting in the muscle injected may occur. Occasionally the patient's become refractory to treatment because they develop antibodies to the toxin. In this event, therapy needs to be modified.  I have read the above information and consent to the administration of botulism toxin.    BOTOX  PROCEDURE NOTE FOR MIGRAINE HEADACHE  Contraindications and precautions discussed with patient(above). Aseptic procedure was observed and patient tolerated procedure. Procedure performed by [name], AGNP-BC.   The condition has existed for more than 6 months, and pt does not have a diagnosis of ALS, Myasthenia Gravis or Lambert-Eaton Syndrome.  Risks and benefits of injections discussed and pt agrees to proceed with the procedure.  Written consent obtained  These injections are medically necessary. Pt  receives good benefits from these injections. These injections do not cause sedations or hallucinations which the oral therapies may cause.   Description of procedure:  The patient was placed in a sitting position. The standard protocol was used for Botox  as follows, with 5 units of Botox  injected at each site:  -Procerus muscle, midline injection  -Corrugator muscle, bilateral injection  -Frontalis muscle, bilateral  injection, with 2 sites each side, medial injection was performed in the upper one third of the frontalis muscle, in the region vertical from the medial inferior edge of the superior orbital rim. The lateral injection was again in the upper one third of the forehead vertically above the lateral limbus of the cornea, 1.5 cm lateral to the medial injection site.  -Temporalis muscle injection, 4 sites, bilaterally. The first injection was 3 cm above the tragus of the ear, second injection site was 1.5 cm to 3 cm up from the first injection site in line with the tragus of the ear. The third injection site was 1.5-3 cm forward between the first 2 injection sites. The fourth injection site was 1.5 cm posterior to the second injection site. 5th site laterally in the temporalis  muscleat the level of the outer canthus.  -Occipitalis muscle injection, 3 sites, bilaterally. The first injection was done one half way between the occipital protuberance and the tip of the mastoid process behind the ear. The second injection site was done lateral and superior to the first, 1 fingerbreadth from the first injection. The third injection site was 1 fingerbreadth superiorly and medially from the first injection site.  -Cervical paraspinal muscle injection, 2 sites, bilaterally. The first injection site was 1 cm from the midline of the cervical spine, 3 cm inferior to the lower border of the occipital protuberance. The second injection site was 1.5 cm superiorly and laterally to the first injection site.  -Trapezius muscle injection was performed at 3 sites, bilaterally. The first injection site was in the upper trapezius muscle halfway between the inflection point of the neck, and the acromion. The second injection site was one half way between the acromion and the first injection site. The third injection was done between the first injection site and the inflection point of the neck.    A total of 200 units of Botox  was  prepared, 155 units of Botox  was injected as documented above, 45U Botox  not injected was wasted. The patient tolerated the procedure well, there were no complications of the above procedure.   Brooke Fox  [facility] [address] [place], [place] [address]  Phone [phone] Fax [phone]

## 2024-05-13 ENCOUNTER — Other Ambulatory Visit: Payer: Self-pay

## 2024-05-13 MED ORDER — AMPHETAMINE-DEXTROAMPHETAMINE 20 MG PO TABS
20.0000 mg | ORAL_TABLET | Freq: Two times a day (BID) | ORAL | 0 refills | Status: DC
  Filled 2024-05-13: qty 60, 30d supply, fill #0

## 2024-06-25 ENCOUNTER — Other Ambulatory Visit: Payer: Self-pay | Admitting: Neurology

## 2024-06-25 ENCOUNTER — Other Ambulatory Visit: Payer: Self-pay

## 2024-06-25 DIAGNOSIS — G47419 Narcolepsy without cataplexy: Secondary | ICD-10-CM

## 2024-06-25 DIAGNOSIS — F988 Other specified behavioral and emotional disorders with onset usually occurring in childhood and adolescence: Secondary | ICD-10-CM

## 2024-06-25 DIAGNOSIS — F909 Attention-deficit hyperactivity disorder, unspecified type: Secondary | ICD-10-CM

## 2024-06-25 MED ORDER — AMPHETAMINE-DEXTROAMPHETAMINE 20 MG PO TABS
20.0000 mg | ORAL_TABLET | Freq: Two times a day (BID) | ORAL | 0 refills | Status: DC
  Filled 2024-06-25: qty 60, 30d supply, fill #0

## 2024-07-07 ENCOUNTER — Telehealth: Payer: Self-pay | Admitting: Neurology

## 2024-07-07 NOTE — Telephone Encounter (Signed)
 LVM and sent MyChart msg informing pt of MD departure, requested cb to r/s 11/3 Botox  appt.

## 2024-07-17 ENCOUNTER — Other Ambulatory Visit: Payer: Self-pay

## 2024-07-23 ENCOUNTER — Other Ambulatory Visit: Payer: Self-pay

## 2024-07-27 ENCOUNTER — Other Ambulatory Visit: Payer: Self-pay

## 2024-07-28 ENCOUNTER — Other Ambulatory Visit: Payer: Self-pay

## 2024-07-30 ENCOUNTER — Other Ambulatory Visit (HOSPITAL_COMMUNITY): Payer: Self-pay

## 2024-07-30 ENCOUNTER — Other Ambulatory Visit: Payer: Self-pay

## 2024-07-30 NOTE — Progress Notes (Signed)
 PA request sent from [facility]

## 2024-07-30 NOTE — Telephone Encounter (Signed)
 [facility] reached out needing a new PA for pt's Botox . I completed PA request via CMM and sent her another MyChart message seeing if she'd like to r/s. CMM Key: Key: [id number]

## 2024-08-03 ENCOUNTER — Ambulatory Visit: Admitting: Neurology

## 2024-08-03 NOTE — Telephone Encounter (Signed)
 Auth#: [id number] (08/01/24-07/31/25)

## 2024-08-06 ENCOUNTER — Other Ambulatory Visit: Payer: Self-pay

## 2024-08-10 ENCOUNTER — Other Ambulatory Visit: Payer: Self-pay

## 2024-08-10 ENCOUNTER — Other Ambulatory Visit: Payer: Self-pay | Admitting: Diagnostic Neuroimaging

## 2024-08-10 DIAGNOSIS — F909 Attention-deficit hyperactivity disorder, unspecified type: Secondary | ICD-10-CM

## 2024-08-10 DIAGNOSIS — F988 Other specified behavioral and emotional disorders with onset usually occurring in childhood and adolescence: Secondary | ICD-10-CM

## 2024-08-10 DIAGNOSIS — G47419 Narcolepsy without cataplexy: Secondary | ICD-10-CM

## 2024-08-11 ENCOUNTER — Other Ambulatory Visit: Payer: Self-pay

## 2024-08-12 ENCOUNTER — Other Ambulatory Visit: Payer: Self-pay

## 2024-08-13 ENCOUNTER — Other Ambulatory Visit: Payer: Self-pay

## 2024-08-16 ENCOUNTER — Other Ambulatory Visit: Payer: Self-pay

## 2024-08-17 ENCOUNTER — Other Ambulatory Visit: Payer: Self-pay

## 2024-08-17 ENCOUNTER — Other Ambulatory Visit: Payer: Self-pay | Admitting: Diagnostic Neuroimaging

## 2024-08-17 DIAGNOSIS — F909 Attention-deficit hyperactivity disorder, unspecified type: Secondary | ICD-10-CM

## 2024-08-17 DIAGNOSIS — G47419 Narcolepsy without cataplexy: Secondary | ICD-10-CM

## 2024-08-17 DIAGNOSIS — F988 Other specified behavioral and emotional disorders with onset usually occurring in childhood and adolescence: Secondary | ICD-10-CM

## 2024-08-18 ENCOUNTER — Other Ambulatory Visit: Payer: Self-pay

## 2024-08-18 NOTE — Telephone Encounter (Signed)
 Per chart patient has not had an actual OV since 2022. She has been coming in for Botox  injections regularly. She is an Manufacturing Engineer patient.  Last refill 06/25/24, #60, 0 rf  Dr. Margaret - Please review rx request. Thank you!   Phones: Please contact patient for establish care with new provider Karoline). Thank you!

## 2024-08-18 NOTE — Telephone Encounter (Signed)
DUPLICATE REQUEST PLEASE REFUSE

## 2024-08-19 ENCOUNTER — Other Ambulatory Visit: Payer: Self-pay

## 2024-08-20 ENCOUNTER — Other Ambulatory Visit: Payer: Self-pay

## 2024-08-20 MED FILL — Amphetamine-Dextroamphetamine Tab 20 MG: ORAL | 30 days supply | Qty: 60 | Fill #0 | Status: AC

## 2024-09-10 ENCOUNTER — Other Ambulatory Visit: Payer: Self-pay

## 2024-09-10 NOTE — Progress Notes (Signed)
 Specialty Pharmacy Refill Coordination Note  STORY CONTI is a 33 y.o. female assessed today regarding refills of clinic administered specialty medication(s) OnabotulinumtoxinA  (Botox )   Clinic requested Courier to Provider Office   Delivery date: 09/16/24   Verified address: [facility], [address], [place], [address]   Medication will be filled on: 09/15/24

## 2024-09-19 ENCOUNTER — Telehealth: Admitting: Family Medicine

## 2024-09-19 DIAGNOSIS — J069 Acute upper respiratory infection, unspecified: Secondary | ICD-10-CM

## 2024-09-19 MED ORDER — DOXYCYCLINE HYCLATE 100 MG PO TABS: 100.0000 mg | ORAL_TABLET | Freq: Two times a day (BID) | ORAL | 0 refills | Status: AC

## 2024-09-19 MED ORDER — BENZONATATE 100 MG PO CAPS: 100.0000 mg | ORAL_CAPSULE | Freq: Three times a day (TID) | ORAL | 0 refills | Status: AC | PRN

## 2024-09-19 MED ORDER — PREDNISONE 10 MG (21) PO TBPK: ORAL_TABLET | ORAL | 0 refills | Status: AC

## 2024-09-19 NOTE — Progress Notes (Signed)
 We are sorry that you are not feeling well.  Here is how we plan to help!  Based on your presentation I believe you most likely have A cough due to bacteria.  When patients have a fever and a productive cough with a change in color or increased sputum production, we are concerned about bacterial bronchitis.  If left untreated it can progress to pneumonia.  If your symptoms do not improve with your treatment plan it is important that you contact your provider.   I have prescribed Doxycycline  100 mg twice a day for 7 days     In addition you may use A prescription cough medication called Tessalon  Perles 100mg . You may take 1-2 capsules every 8 hours as needed for your cough.  Prednisone  10 mg daily for 6 days (see taper instructions below)  Directions for 6 day taper: Day 1: 2 tablets before breakfast, 1 after both lunch & dinner and 2 at bedtime Day 2: 1 tab before breakfast, 1 after both lunch & dinner and 2 at bedtime Day 3: 1 tab at each meal & 1 at bedtime Day 4: 1 tab at breakfast, 1 at lunch, 1 at bedtime Day 5: 1 tab at breakfast & 1 tab at bedtime Day 6: 1 tab at breakfast  From your responses in the eVisit questionnaire you describe inflammation in the upper respiratory tract which is causing a significant cough.  This is commonly called Bronchitis and has four common causes:   Allergies Viral Infections Acid Reflux Bacterial Infection Allergies, viruses and acid reflux are treated by controlling symptoms or eliminating the cause. An example might be a cough caused by taking certain blood pressure medications. You stop the cough by changing the medication. Another example might be a cough caused by acid reflux. Controlling the reflux helps control the cough.  USE OF BRONCHODILATOR (RESCUE) INHALERS: There is a risk from using your bronchodilator too frequently.  The risk is that over-reliance on a medication which only relaxes the muscles surrounding the breathing tubes can reduce  the effectiveness of medications prescribed to reduce swelling and congestion of the tubes themselves.  Although you feel brief relief from the bronchodilator inhaler, your asthma may actually be worsening with the tubes becoming more swollen and filled with mucus.  This can delay other crucial treatments, such as oral steroid medications. If you need to use a bronchodilator inhaler daily, several times per day, you should discuss this with your provider.  There are probably better treatments that could be used to keep your asthma under control.     HOME CARE Only take medications as instructed by your medical team. Complete the entire course of an antibiotic. Drink plenty of fluids and get plenty of rest. Avoid close contacts especially the very young and the elderly Cover your mouth if you cough or cough into your sleeve. Always remember to wash your hands A steam or ultrasonic humidifier can help congestion.   GET HELP RIGHT AWAY IF: You develop worsening fever. You become short of breath You cough up blood. Your symptoms persist after you have completed your treatment plan MAKE SURE YOU  Understand these instructions. Will watch your condition. Will get help right away if you are not doing well or get worse.  Your e-visit answers were reviewed by a board certified advanced clinical practitioner to complete your personal care plan.  Depending on the condition, your plan could have included both over the counter or prescription medications. If there is a  problem please reply  once you have received a response from your provider. Your safety is important to us .  If you have drug allergies check your prescription carefully.    You can use MyChart to ask questions about todays visit, request a non-urgent call back, or ask for a work or school excuse for 24 hours related to this e-Visit. If it has been greater than 24 hours you will need to follow up with your provider, or enter a new e-Visit  to address those concerns. You will get an e-mail in the next two days asking about your experience.  I hope that your e-visit has been valuable and will speed your recovery. Thank you for using e-visits.   I have spent 5 minutes in review of e-visit questionnaire, review and updating patient chart, medical decision making and response to patient.   Roosvelt Mater, PA-C

## 2024-09-20 ENCOUNTER — Other Ambulatory Visit: Payer: Self-pay | Admitting: Diagnostic Neuroimaging

## 2024-09-20 DIAGNOSIS — F988 Other specified behavioral and emotional disorders with onset usually occurring in childhood and adolescence: Secondary | ICD-10-CM

## 2024-09-20 DIAGNOSIS — G47419 Narcolepsy without cataplexy: Secondary | ICD-10-CM

## 2024-09-20 DIAGNOSIS — F909 Attention-deficit hyperactivity disorder, unspecified type: Secondary | ICD-10-CM

## 2024-09-21 ENCOUNTER — Telehealth: Payer: Self-pay

## 2024-09-21 NOTE — Telephone Encounter (Signed)
 Called and LVM for pt to schedule an appt with assigned MD Dr Margaret. Left office number for pt to call back.

## 2024-09-22 ENCOUNTER — Ambulatory Visit: Admitting: Adult Health

## 2024-09-22 ENCOUNTER — Other Ambulatory Visit: Payer: Self-pay

## 2024-09-22 ENCOUNTER — Telehealth: Payer: Self-pay

## 2024-09-22 ENCOUNTER — Other Ambulatory Visit (HOSPITAL_COMMUNITY): Payer: Self-pay

## 2024-09-22 VITALS — BP 136/89 | HR 87

## 2024-09-22 DIAGNOSIS — G43709 Chronic migraine without aura, not intractable, without status migrainosus: Secondary | ICD-10-CM | POA: Diagnosis not present

## 2024-09-22 MED ORDER — ONABOTULINUMTOXINA 200 UNITS IJ SOLR
155.0000 [IU] | Freq: Once | INTRAMUSCULAR | Status: AC
  Administered 2024-09-22: 155 [IU] via INTRAMUSCULAR

## 2024-09-22 MED ORDER — AMPHETAMINE-DEXTROAMPHETAMINE 20 MG PO TABS
20.0000 mg | ORAL_TABLET | Freq: Two times a day (BID) | ORAL | 0 refills | Status: AC
  Filled 2024-09-22: qty 60, 30d supply, fill #0

## 2024-09-22 MED ORDER — NURTEC 75 MG PO TBDP
75.0000 mg | ORAL_TABLET | ORAL | 11 refills | Status: AC | PRN
  Filled 2024-09-22: qty 16, 30d supply, fill #0

## 2024-09-22 NOTE — Progress Notes (Signed)
 Botox - 200 units x 1 vial Lot: [id number] Expiration: 08/2025 NDC: 9976-6078-97   Bacteriostatic 0.9% Sodium Chloride - 4mL total Lot: [id number] Expiration: 11/2026 NDC: 9976-6078-97   Dx: H56.290 SP   Witnessed by Rojean DEL

## 2024-09-22 NOTE — Progress Notes (Signed)
 "    Update 09/22/2024 JM: Returns for repeat Botox .  Prior Botox  05/11/2024 with Dr. Ines.  Continued great benefit with use of Botox , can wear off some prior to next injection but still noting >50% migraine reduction with botox .  She also remains on monthly Emgality.  Use of Nurtec as needed with benefit, does have rx for rizatriptan  as needed but has not recently used.  Occasional use of Zofran .  Previously prescribed Adderall by Dr. Ines for ADHD and fatigue but request ongoing management by PCP as Dr. Ines is no longer at this practice or can be referred to [facility], patient verbalized understanding.  She was provided 1 month refill by Dr. Margaret.  Tolerated procedure well today. will return in 3 months for repeat injection.       Consent Form Botulism Toxin Injection For Chronic Migraine    Reviewed orally with patient, additionally signature is on file:  Botulism toxin has been approved by the Federal drug administration for treatment of chronic migraine. Botulism toxin does not cure chronic migraine and it may not be effective in some patients.  The administration of botulism toxin is accomplished by injecting a small amount of toxin into the muscles of the neck and head. Dosage must be titrated for each individual. Any benefits resulting from botulism toxin tend to wear off after 3 months with a repeat injection required if benefit is to be maintained. Injections are usually done every 3-4 months with maximum effect peak achieved by about 2 or 3 weeks. Botulism toxin is expensive and you should be sure of what costs you will incur resulting from the injection.  The side effects of botulism toxin use for chronic migraine may include:   -Transient, and usually mild, facial weakness with facial injections  -Transient, and usually mild, head or neck weakness with head/neck injections  -Reduction or loss of forehead facial animation due to forehead muscle weakness  -Eyelid drooping  -Dry  eye  -Pain at the site of injection or bruising at the site of injection  -Double vision  -Potential unknown long term risks   Contraindications: You should not have Botox  if you are pregnant, nursing, allergic to albumin, have an infection, skin condition, or muscle weakness at the site of the injection, or have myasthenia gravis, Lambert-Eaton syndrome, or ALS.  It is also possible that as with any injection, there may be an allergic reaction or no effect from the medication. Reduced effectiveness after repeated injections is sometimes seen and rarely infection at the injection site may occur. All care will be taken to prevent these side effects. If therapy is given over a long time, atrophy and wasting in the muscle injected may occur. Occasionally the patient's become refractory to treatment because they develop antibodies to the toxin. In this event, therapy needs to be modified.  I have read the above information and consent to the administration of botulism toxin.    BOTOX  PROCEDURE NOTE FOR MIGRAINE HEADACHE  Contraindications and precautions discussed with patient(above). Aseptic procedure was observed and patient tolerated procedure. Procedure performed by [name], AGNP-BC.   The condition has existed for more than 6 months, and pt does not have a diagnosis of ALS, Myasthenia Gravis or Lambert-Eaton Syndrome.  Risks and benefits of injections discussed and pt agrees to proceed with the procedure.  Written consent obtained  These injections are medically necessary. Pt  receives good benefits from these injections. These injections do not cause sedations or hallucinations which the oral  therapies may cause.   Description of procedure:  The patient was placed in a sitting position. The standard protocol was used for Botox  as follows, with 5 units of Botox  injected at each site:  -Procerus muscle, midline injection  -Corrugator muscle, bilateral injection  -Frontalis  muscle, bilateral injection, with 2 sites each side, medial injection was performed in the upper one third of the frontalis muscle, in the region vertical from the medial inferior edge of the superior orbital rim. The lateral injection was again in the upper one third of the forehead vertically above the lateral limbus of the cornea, 1.5 cm lateral to the medial injection site.  -Temporalis muscle injection, 4 sites, bilaterally. The first injection was 3 cm above the tragus of the ear, second injection site was 1.5 cm to 3 cm up from the first injection site in line with the tragus of the ear. The third injection site was 1.5-3 cm forward between the first 2 injection sites. The fourth injection site was 1.5 cm posterior to the second injection site. 5th site laterally in the temporalis  muscleat the level of the outer canthus.  -Occipitalis muscle injection, 3 sites, bilaterally. The first injection was done one half way between the occipital protuberance and the tip of the mastoid process behind the ear. The second injection site was done lateral and superior to the first, 1 fingerbreadth from the first injection. The third injection site was 1 fingerbreadth superiorly and medially from the first injection site.  -Cervical paraspinal muscle injection, 2 sites, bilaterally. The first injection site was 1 cm from the midline of the cervical spine, 3 cm inferior to the lower border of the occipital protuberance. The second injection site was 1.5 cm superiorly and laterally to the first injection site.  -Trapezius muscle injection was performed at 3 sites, bilaterally. The first injection site was in the upper trapezius muscle halfway between the inflection point of the neck, and the acromion. The second injection site was one half way between the acromion and the first injection site. The third injection was done between the first injection site and the inflection point of the neck.    A total of 200  units of Botox  was prepared, 155 units of Botox  was injected as documented above, any Botox  not injected was wasted. The patient tolerated the procedure well, there were no complications of the above procedure.   [name], AGNP-BC  [facility] [address] [place], [place] [address]  Phone [phone] Fax [phone] Note: This document was prepared with digital dictation and possible smart phrase technology. Any transcriptional errors that result from this process are unintentional.    "

## 2024-09-22 NOTE — Telephone Encounter (Signed)
 Pharmacy Patient Advocate Encounter   Received notification from CoverMyMeds that prior authorization for Nurtec is required/requested.   Insurance verification completed.   The patient is insured through [facility].   Per test claim: PA required; PA submitted to above mentioned insurance via Latent Key/confirmation #/EOC [facility] Status is pending

## 2024-09-23 NOTE — Telephone Encounter (Signed)
 Pharmacy Patient Advocate Encounter  Received notification from [facility] that Prior Authorization for Nurtec has been APPROVED from 09/22/2024 to 03/21/2025   PA #/Case ID/Reference #: [id number]

## 2024-10-02 ENCOUNTER — Encounter: Payer: Self-pay | Admitting: Adult Health

## 2024-10-02 ENCOUNTER — Encounter: Payer: Self-pay | Admitting: Family

## 2024-10-05 ENCOUNTER — Other Ambulatory Visit: Payer: Self-pay

## 2024-10-05 MED ORDER — ONDANSETRON 4 MG PO TBDP
4.0000 mg | ORAL_TABLET | Freq: Three times a day (TID) | ORAL | 11 refills | Status: AC | PRN
  Filled 2024-10-05 – 2024-10-09 (×3): qty 20, 7d supply, fill #0

## 2024-10-08 ENCOUNTER — Other Ambulatory Visit: Payer: Self-pay

## 2024-10-09 ENCOUNTER — Other Ambulatory Visit: Payer: Self-pay

## 2024-10-09 ENCOUNTER — Other Ambulatory Visit (HOSPITAL_COMMUNITY): Payer: Self-pay

## 2024-10-28 ENCOUNTER — Other Ambulatory Visit: Payer: Self-pay

## 2024-10-28 MED ORDER — SULFAMETHOXAZOLE-TRIMETHOPRIM 800-160 MG PO TABS
1.0000 | ORAL_TABLET | Freq: Two times a day (BID) | ORAL | 0 refills | Status: AC
  Filled 2024-10-28: qty 14, 7d supply, fill #0

## 2024-12-07 ENCOUNTER — Ambulatory Visit: Admitting: Diagnostic Neuroimaging

## 2024-12-17 ENCOUNTER — Ambulatory Visit: Admitting: Adult Health

## 2024-12-22 ENCOUNTER — Ambulatory Visit: Admitting: Adult Health

## 2025-03-11 ENCOUNTER — Encounter: Admitting: Family
